# Patient Record
Sex: Female | Born: 1950 | Race: White | Hispanic: No | Marital: Married | Smoking: Never smoker
Health system: Southern US, Community
[De-identification: ages and names within clinical notes are randomized; demographics above are authoritative.]

## PROBLEM LIST (undated history)

## (undated) DIAGNOSIS — D2262 Melanocytic nevi of left upper limb, including shoulder: Secondary | ICD-10-CM

## (undated) DIAGNOSIS — E669 Obesity, unspecified: Secondary | ICD-10-CM

## (undated) DIAGNOSIS — M722 Plantar fascial fibromatosis: Secondary | ICD-10-CM

## (undated) DIAGNOSIS — D2261 Melanocytic nevi of right upper limb, including shoulder: Secondary | ICD-10-CM

## (undated) DIAGNOSIS — C801 Malignant (primary) neoplasm, unspecified: Secondary | ICD-10-CM

## (undated) DIAGNOSIS — G4730 Sleep apnea, unspecified: Secondary | ICD-10-CM

## (undated) DIAGNOSIS — D125 Benign neoplasm of sigmoid colon: Secondary | ICD-10-CM

## (undated) DIAGNOSIS — F1020 Alcohol dependence, uncomplicated: Secondary | ICD-10-CM

## (undated) DIAGNOSIS — M1712 Unilateral primary osteoarthritis, left knee: Secondary | ICD-10-CM

## (undated) DIAGNOSIS — R569 Unspecified convulsions: Secondary | ICD-10-CM

## (undated) DIAGNOSIS — D225 Melanocytic nevi of trunk: Secondary | ICD-10-CM

## (undated) DIAGNOSIS — M19011 Primary osteoarthritis, right shoulder: Secondary | ICD-10-CM

## (undated) DIAGNOSIS — D509 Iron deficiency anemia, unspecified: Secondary | ICD-10-CM

## (undated) DIAGNOSIS — L814 Other melanin hyperpigmentation: Secondary | ICD-10-CM

## (undated) DIAGNOSIS — M810 Age-related osteoporosis without current pathological fracture: Secondary | ICD-10-CM

## (undated) DIAGNOSIS — I8390 Asymptomatic varicose veins of unspecified lower extremity: Secondary | ICD-10-CM

## (undated) DIAGNOSIS — D2271 Melanocytic nevi of right lower limb, including hip: Secondary | ICD-10-CM

## (undated) DIAGNOSIS — D2272 Melanocytic nevi of left lower limb, including hip: Secondary | ICD-10-CM

## (undated) HISTORY — DX: Melanocytic nevi of right upper limb, including shoulder: D22.61

## (undated) HISTORY — DX: Age-related osteoporosis without current pathological fracture: M81.0

## (undated) HISTORY — DX: Melanocytic nevi of left lower limb, including hip: D22.72

## (undated) HISTORY — DX: Melanocytic nevi of trunk: D22.5

## (undated) HISTORY — DX: Melanocytic nevi of left upper limb, including shoulder: D22.62

## (undated) HISTORY — DX: Asymptomatic varicose veins of unspecified lower extremity: I83.90

## (undated) HISTORY — DX: Obesity, unspecified: E66.9

## (undated) HISTORY — DX: Plantar fascial fibromatosis: M72.2

## (undated) HISTORY — PX: ECTOPIC PREGNANCY SURGERY: SHX613

## (undated) HISTORY — DX: Benign neoplasm of sigmoid colon: D12.5

## (undated) HISTORY — DX: Unilateral primary osteoarthritis, left knee: M17.12

## (undated) HISTORY — DX: Other melanin hyperpigmentation: L81.4

## (undated) HISTORY — PX: POLYPECTOMY: SHX149

## (undated) HISTORY — DX: Melanocytic nevi of right lower limb, including hip: D22.71

## (undated) HISTORY — PX: BARIATRIC SURGERY: SHX1103

## (undated) HISTORY — DX: Primary osteoarthritis, right shoulder: M19.011

## (undated) HISTORY — DX: Malignant (primary) neoplasm, unspecified: C80.1

## (undated) HISTORY — DX: Iron deficiency anemia, unspecified: D50.9

## (undated) HISTORY — PX: SIGMOIDOSCOPY: SUR1295

## (undated) HISTORY — DX: Alcohol dependence, uncomplicated: F10.20

## (undated) HISTORY — DX: Unspecified convulsions: R56.9

---

## 2014-07-05 DIAGNOSIS — R569 Unspecified convulsions: Secondary | ICD-10-CM | POA: Insufficient documentation

## 2016-12-11 DIAGNOSIS — M19011 Primary osteoarthritis, right shoulder: Secondary | ICD-10-CM | POA: Insufficient documentation

## 2016-12-11 DIAGNOSIS — M1712 Unilateral primary osteoarthritis, left knee: Secondary | ICD-10-CM

## 2016-12-11 HISTORY — DX: Primary osteoarthritis, right shoulder: M19.011

## 2016-12-11 HISTORY — DX: Unilateral primary osteoarthritis, left knee: M17.12

## 2017-01-11 DIAGNOSIS — I8390 Asymptomatic varicose veins of unspecified lower extremity: Secondary | ICD-10-CM

## 2017-01-11 DIAGNOSIS — D2271 Melanocytic nevi of right lower limb, including hip: Secondary | ICD-10-CM

## 2017-01-11 DIAGNOSIS — D2272 Melanocytic nevi of left lower limb, including hip: Secondary | ICD-10-CM

## 2017-01-11 DIAGNOSIS — L821 Other seborrheic keratosis: Secondary | ICD-10-CM | POA: Insufficient documentation

## 2017-01-11 DIAGNOSIS — M810 Age-related osteoporosis without current pathological fracture: Secondary | ICD-10-CM | POA: Insufficient documentation

## 2017-01-11 DIAGNOSIS — L814 Other melanin hyperpigmentation: Secondary | ICD-10-CM

## 2017-01-11 DIAGNOSIS — D2262 Melanocytic nevi of left upper limb, including shoulder: Secondary | ICD-10-CM

## 2017-01-11 DIAGNOSIS — M542 Cervicalgia: Secondary | ICD-10-CM | POA: Insufficient documentation

## 2017-01-11 DIAGNOSIS — Z1283 Encounter for screening for malignant neoplasm of skin: Secondary | ICD-10-CM | POA: Insufficient documentation

## 2017-01-11 DIAGNOSIS — D2261 Melanocytic nevi of right upper limb, including shoulder: Secondary | ICD-10-CM

## 2017-01-11 DIAGNOSIS — D225 Melanocytic nevi of trunk: Secondary | ICD-10-CM

## 2017-01-11 HISTORY — DX: Melanocytic nevi of left upper limb, including shoulder: D22.62

## 2017-01-11 HISTORY — DX: Melanocytic nevi of right upper limb, including shoulder: D22.61

## 2017-01-11 HISTORY — DX: Other melanin hyperpigmentation: L81.4

## 2017-01-11 HISTORY — DX: Melanocytic nevi of right lower limb, including hip: D22.71

## 2017-01-11 HISTORY — DX: Melanocytic nevi of left lower limb, including hip: D22.72

## 2017-01-11 HISTORY — DX: Melanocytic nevi of trunk: D22.5

## 2017-01-11 HISTORY — DX: Asymptomatic varicose veins of unspecified lower extremity: I83.90

## 2017-01-12 DIAGNOSIS — D509 Iron deficiency anemia, unspecified: Secondary | ICD-10-CM

## 2017-01-12 HISTORY — DX: Iron deficiency anemia, unspecified: D50.9

## 2017-01-25 DIAGNOSIS — R569 Unspecified convulsions: Secondary | ICD-10-CM

## 2017-01-25 HISTORY — DX: Unspecified convulsions: R56.9

## 2017-02-23 DIAGNOSIS — Z800 Family history of malignant neoplasm of digestive organs: Secondary | ICD-10-CM | POA: Insufficient documentation

## 2017-03-18 DIAGNOSIS — D126 Benign neoplasm of colon, unspecified: Secondary | ICD-10-CM | POA: Insufficient documentation

## 2017-03-18 DIAGNOSIS — D125 Benign neoplasm of sigmoid colon: Secondary | ICD-10-CM

## 2017-03-18 HISTORY — PX: COLONOSCOPY: SHX174

## 2017-03-18 HISTORY — DX: Benign neoplasm of sigmoid colon: D12.5

## 2017-04-01 ENCOUNTER — Ambulatory Visit (HOSPITAL_COMMUNITY)
Admission: EM | Admit: 2017-04-01 | Discharge: 2017-04-01 | Disposition: A | Discharge status: Home / Self Care | Payer: Medicare Other | Attending: Internal Medicine | Admitting: Internal Medicine

## 2017-04-01 ENCOUNTER — Encounter (HOSPITAL_COMMUNITY): Payer: Self-pay | Admitting: Emergency Medicine

## 2017-04-01 DIAGNOSIS — M7052 Other bursitis of knee, left knee: Secondary | ICD-10-CM

## 2017-04-01 NOTE — Discharge Instructions (Addendum)
Suspect blood vessels under left knee are prominent due to mild swelling/inflammation from some knee bursitis, stirred up by knee motion during moving a couple weeks ago.  Ice for 5-10 minutes several times daily may help reduce discomfort, and a knee sleeve may provide comfort also.  Ibuprofen 200 mg 1-2 tablets up to 3 times daily should also provide some relief.  No danger signs on exam.  Recheck for marked increase in leg swelling.  Followup with orthopedist next week as planned.

## 2017-04-01 NOTE — ED Triage Notes (Signed)
Pt reports she noticed some skin discoloration this afternoon on her left knee  She is currently being treated for arthritis w/injections   Sx also include pain  Denies inj/trauma  A&O x4... NAD... Ambulatory

## 2017-04-01 NOTE — ED Provider Notes (Signed)
[place]    CSN: [id number] Arrival date & time: 04/01/17  1913     History   Chief Complaint Chief Complaint  Patient presents with  . Skin Discoloration    HPI Summer [name] is a 66 y.o. female. She has been getting some knee injections for arthritis, is visiting out of state. She is moving to the area, and has been in the last couple of weeks carrying boxes and doing a lot of getting up and down. Pain in the left knee has been increasing over that time period. Today, she noticed what seemed to be a large bruise or some prominent blood vessels just below and medial to the left knee. A little worried about a blood clot. No change in leg swelling. Still able to fully flex and extend at the left knee.    HPI  History reviewed. No pertinent past medical history.  History reviewed. No pertinent surgical history.   Home Medications    Prior to Admission medications   Medication Sig Start Date End Date Taking? Authorizing Provider  ferrous sulfate 325 (65 FE) MG EC tablet Take 325 mg by mouth 3 (three) times daily with meals.   Yes [provider]  LAMOTRIGINE PO Take by mouth.   Yes [provider]  LevETIRAcetam (KEPPRA PO) Take by mouth.   Yes [provider]    Family History History reviewed. No pertinent family history.  Social History Social History  Substance Use Topics  . Smoking status: Never Smoker  . Smokeless tobacco: Never Used  . Alcohol use No     Allergies   Patient has no known allergies.   Review of Systems Review of Systems  All other systems reviewed and are negative.    Physical Exam Triage Vital Signs ED Triage Vitals  Enc Vitals Group     BP 04/01/17 2008 138/70     Pulse Rate 04/01/17 2008 72     Resp 04/01/17 2008 20     Temp 04/01/17 2008 98.5 F (36.9 C)     Temp Source 04/01/17 2008 Oral     SpO2 04/01/17 2008 99 %     Weight --      Height --      Pain Score 04/01/17 2010 9   Pain Loc --    Updated Vital Signs BP 138/70 (BP Location: Right Arm)   Pulse 72   Temp 98.5 F (36.9 C) (Oral)   Resp 20   SpO2 99%   Physical Exam  Constitutional: She is oriented to person, place, and time. No distress.  HENT:  Head: Atraumatic.  Eyes:  Conjugate gaze observed, no eye redness/discharge  Neck: Neck supple.  Cardiovascular: Normal rate.   Pulmonary/Chest: No respiratory distress.  Abdominal: She exhibits no distension.  Musculoskeletal: Normal range of motion.  Subtle puffiness and tenderness below and medial to the left knee, with a prominent web of blood vessels. Equivocal slight warmth. No erythema, no bruising, no break in the skin. No focal tenderness. Good range of motion of both knees, able to flex more than 90 and fully extend bilaterally. Walked into the urgent care independently, and able to climb on/off the exam table. Measurements include left calf 19 inches, right calf 17-3/4 inches, left ankle 11-3/4 inches, right ankle 11-1/4 inch. Patient does not believe that her leg contours are significantly larger or different than usual.  Neurological: She is alert and oriented to person, place, and time.  Skin: Skin is warm  and dry.  Nursing note and vitals reviewed.    UC Treatments / Results   Procedures Procedures (including critical care time) None today  Final Clinical Impressions(s) / UC Diagnoses   Final diagnoses:  Pes anserinus bursitis of left knee   Suspect blood vessels under left knee are prominent due to mild swelling/inflammation from some knee bursitis, stirred up by knee motion during moving a couple weeks ago.  Ice for 5-10 minutes several times daily may help reduce discomfort, and a knee sleeve may provide comfort also.  Ibuprofen 200 mg 1-2 tablets up to 3 times daily should also provide some relief.  No danger signs on exam.  Recheck for marked increase in leg swelling.  Followup with orthopedist next week as planned.         Sherlene Shams, MD 04/03/17 1051

## 2017-05-20 ENCOUNTER — Ambulatory Visit (INDEPENDENT_AMBULATORY_CARE_PROVIDER_SITE_OTHER): Payer: Medicare Other | Admitting: Family Medicine

## 2017-05-20 ENCOUNTER — Encounter: Payer: Self-pay | Admitting: Family Medicine

## 2017-05-20 VITALS — BP 122/84 | HR 71 | Temp 97.7°F | Ht 66.0 in | Wt 226.2 lb

## 2017-05-20 DIAGNOSIS — D509 Iron deficiency anemia, unspecified: Secondary | ICD-10-CM | POA: Diagnosis not present

## 2017-05-20 DIAGNOSIS — G8929 Other chronic pain: Secondary | ICD-10-CM

## 2017-05-20 DIAGNOSIS — R238 Other skin changes: Secondary | ICD-10-CM | POA: Diagnosis not present

## 2017-05-20 DIAGNOSIS — M25562 Pain in left knee: Secondary | ICD-10-CM | POA: Diagnosis not present

## 2017-05-20 DIAGNOSIS — E669 Obesity, unspecified: Secondary | ICD-10-CM | POA: Diagnosis not present

## 2017-05-20 NOTE — Progress Notes (Signed)
Summer [name] is a 66 y.o. female is here to Summer [name].   Patient Care Team: Briscoe Deutscher, DO as PCP - General (Family Medicine)   History of Present Illness:   Summer [name] CMA acting as scribe for Dr. [name].  HPI: Patient comes in today to establish care. She just moved to town from [place] in the last week.   Knee pain: Patient is wanting a referral for orthopedic for knee surgery. She has left knee pain.  She does not have pain every day. She has been told by previous provider that she has no cartilage in the knee. Yesterday she had to ice it three times. She takes ibuprofen for the pain. She has had injections in the past with no relief.   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 She declines taking any narcotics due to possible addictive nature.   Anemia: Patient is anemic. She takes a iron supplement for this. Will check labs to determine if she needs iron infusion.   Bariatric surgery: Patient had gastric bypass surgery in 2007 or 2008. She has kept off 100 pounds since surgery. She kept this off by going to the [facility].   Benign papules: Patient is concerned about white papules that are on her face around mouth area. She is more concerned about this because of vanity. We will refer to dermatology to see if they need to be removed.   Health Maintenance Due  Topic Date Due  . Hepatitis C Screening  03-26-1951  . HIV Screening  09/21/1966  . TETANUS/TDAP  09/21/1970  . PAP SMEAR  09/21/1972  . MAMMOGRAM  09/21/2001  . COLONOSCOPY  09/21/2001  . DEXA SCAN  09/21/2016  . PNA vac Low Risk Adult (1 of 2 - PCV13) 09/21/2016   PMHx, SurgHx, SocialHx, Medications, and Allergies were reviewed in the Visit Navigator and updated as appropriate.   Past Medical History:  Diagnosis Date  . Adenomatous polyp of sigmoid colon 03/18/2017   Overview:  Added automatically from request for surgery [phone]  . Asymptomatic varicose veins 01/11/2017  . Iron deficiency  anemia 01/12/2017   Last Assessment & Plan:  Given script for ferrous sulfate and order for repeat cbc with diff and iron studies to be done in 1 month Will also refer pt back to [facility] for repeat colonoscopy and to dietician for nutritional counseling, per pt request  . Lentigines 01/11/2017  . Melanocytic nevi of left lower limb, including hip 01/11/2017  . Melanocytic nevi of left upper limb, including shoulder 01/11/2017  . Melanocytic nevi of right lower limb, including hip 01/11/2017  . Melanocytic nevi of right upper limb, including shoulder 01/11/2017  . Melanocytic nevi of trunk 01/11/2017  . Obesity (BMI 30-39.9) 05/21/2017  . Osteopenia 01/11/2017  . 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 Seizure ([place]) 01/25/2017  . Unilateral primary osteoarthritis, left knee 12/11/2016   No past surgical history on file. No family history on file.   Social History  Substance Use Topics  . Smoking status: Never Smoker  . Smokeless tobacco: Never Used  . Alcohol use No   Current Medications and Allergies:   .  denosumab (PROLIA) 60 MG/ML SOLN injection, Inject 60 mg into the skin every 6 (six) months. Administer in upper arm, thigh, or abdomen, Disp: , Rfl:  .  ferrous sulfate 325 (65 FE) MG EC tablet, Take 325 mg by mouth 3 (three) times daily with meals., Disp: , Rfl:  .  LAMOTRIGINE PO, Take by mouth., Disp: , Rfl:  .  LevETIRAcetam (KEPPRA PO), Take by mouth., Disp: , Rfl:    Allergies  Allergen Reactions  . Bee Venom Swelling  . Latex Rash    Contact dermatitis     Review of Systems:   Pertinent items are noted in the HPI. Otherwise, ROS is negative.  Vitals:   Vitals:   05/20/17 1141  BP: 122/84  Pulse: 71  Temp: 97.7 F (36.5 C)  TempSrc: Oral  SpO2: 97%  Weight: 226 lb 3.2 oz (102.6 kg)  Height: 5\' 6"  (1.676 m)     Body mass index is 36.51 kg/m.  Physical Exam:   Physical Exam  Constitutional: She appears well-developed and well-nourished. No distress.    HENT:  Head: Normocephalic and atraumatic.  Eyes: Pupils are equal, round, and reactive to light. EOM are normal.  Neck: Normal range of motion. Neck supple.  Cardiovascular: Normal rate, regular rhythm, normal heart sounds and intact distal pulses.   Pulmonary/Chest: Effort normal.  Abdominal: Soft.  Skin: Skin is warm.  Psychiatric: She has a normal mood and affect. Her behavior is normal.  Nursing note and vitals reviewed.  Assessment and Plan:   [name] was seen today for establish care.  Diagnoses and all orders for this visit:  Chronic pain of left knee -     Ambulatory referral to Orthopedics  Benign papule -     Ambulatory referral to Dermatology  Obesity (BMI 30-39.9) Comments: The patient is asked to make an attempt to improve diet and exercise patterns to aid in medical management of this problem.    . Reviewed expectations re: course of current medical issues. . Discussed self-management of symptoms. . Outlined signs and symptoms indicating need for more acute intervention. . Patient verbalized understanding and all questions were answered. Marland Kitchen Health Maintenance issues including appropriate healthy diet, exercise, and smoking avoidance were discussed with patient. . See orders for this visit as documented in the electronic medical record. . Patient received an After Visit Summary.  CMA served as Education administrator during this visit. History, Physical, and Plan performed by medical provider. The above documentation has been reviewed and is accurate and complete. Briscoe Deutscher, D.O.  Briscoe Deutscher, DO , [facility] 05/21/2017  Future Appointments Date Time Provider [place]  11/19/2017 1:30 PM Briscoe Deutscher, DO [facility] None

## 2017-05-21 ENCOUNTER — Encounter: Payer: Self-pay | Admitting: Family Medicine

## 2017-05-21 DIAGNOSIS — E669 Obesity, unspecified: Secondary | ICD-10-CM

## 2017-05-21 DIAGNOSIS — D649 Anemia, unspecified: Secondary | ICD-10-CM | POA: Insufficient documentation

## 2017-05-21 HISTORY — DX: Obesity, unspecified: E66.9

## 2017-05-26 ENCOUNTER — Telehealth: Payer: Self-pay | Admitting: Family Medicine

## 2017-05-26 NOTE — Telephone Encounter (Signed)
ROI faxed to [facility]

## 2017-05-27 ENCOUNTER — Telehealth: Payer: Self-pay | Admitting: Family Medicine

## 2017-05-27 NOTE — Telephone Encounter (Signed)
Patient would like to follow through with getting a physical trainer prior to her surgery. Call patient to advise and gather more information.

## 2017-05-28 NOTE — Telephone Encounter (Signed)
Left message for patient asking if she would prefer [facility] PREP Program referral or referral to physical therapy.

## 2017-05-28 NOTE — Telephone Encounter (Signed)
Please advise 

## 2017-05-28 NOTE — Telephone Encounter (Signed)
After weighing the options, patient chose PREP program.  Referral filled out and faxed.

## 2017-05-28 NOTE — Telephone Encounter (Signed)
Offer [facility] PT versus [facility].

## 2017-05-28 NOTE — Telephone Encounter (Signed)
Did you patient to have physical therapy or personal trainer?

## 2017-05-31 ENCOUNTER — Telehealth: Payer: Self-pay

## 2017-05-31 NOTE — Telephone Encounter (Signed)
Spoke w/"Margie" about the PREP and she will be coming in to register on Friday 06/04/17 at 8:30 am.

## 2017-06-07 NOTE — Progress Notes (Signed)
[facility] Report   Patient Details  Name: Summer [name] MRN: [id number] Date of Birth: 07/16/1951 Age: 66 y.o. PCP: Briscoe Deutscher, DO  Vitals:   06/07/17 1256  BP: 140/80  Pulse: 66  Resp: 18  SpO2: 99%  Weight: 232 lb (105.2 kg)         [facility] Eval - 06/07/17 1200      Referral    Referring Provider dr. [name]   Reason for referral High Cholesterol;Hypertension;Inactivity;Obesitity/Overweight;Orthopedic   Program Start Date 06/07/17     Measurement   Waist Circumference 40.5 inches   Hip Circumference 53 inches   Body fat 46.8 percent     Information for Trainer   Goals "lose weight, strength training, decrease pain to neck, LT knee, RT shoulder"   Current Exercise none   Orthopedic Concerns LT knee, RT shoulder, neck   Pertinent Medical History epilepsy, anemia, htn, arthritis   Current Barriers none     Timed Up and Go (TUGS)   Timed Up and Go Moderate risk 10-12 seconds  11.76 secs     Mobility and Daily Activities   I find it easy to walk up or down two or more flights of stairs. 1   I have no trouble taking out the trash. 4   I do housework such as vacuuming and dusting on my own without difficulty. 4   I can easily lift a gallon of milk (8lbs). 4   I can easily walk a mile. 1   I have no trouble reaching into high cupboards or reaching down to pick up something from the floor. 3   I do not have trouble doing out-door work such as Armed forces logistics/support/administrative officer, raking leaves, or gardening. 3     Mobility and Daily Activities   I feel younger than my age. 4   I feel independent. 4   I feel energetic. 2   I live an active life.  2   I feel strong. 2   I feel healthy. 2   I feel active as other people my age. 3     How fit and strong are you.   Fit and Strong Total Score 39     Past Medical History:  Diagnosis Date  . Adenomatous polyp of sigmoid colon 03/18/2017   Overview:  Added automatically from request for surgery [phone]  .  Asymptomatic varicose veins 01/11/2017  . Iron deficiency anemia 01/12/2017   Last Assessment & Plan:  Given script for ferrous sulfate and order for repeat cbc with diff and iron studies to be done in 1 month Will also refer pt back to [facility] for repeat colonoscopy and to dietician for nutritional counseling, per pt request  . Lentigines 01/11/2017  . Melanocytic nevi of left lower limb, including hip 01/11/2017  . Melanocytic nevi of left upper limb, including shoulder 01/11/2017  . Melanocytic nevi of right lower limb, including hip 01/11/2017  . Melanocytic nevi of right upper limb, including shoulder 01/11/2017  . Melanocytic nevi of trunk 01/11/2017  . Obesity (BMI 30-39.9) 05/21/2017  . Osteopenia 01/11/2017  . 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 Seizure ([place]) 01/25/2017  . Unilateral primary osteoarthritis, left knee 12/11/2016   No past surgical history on file. History  Smoking Status  . Never Smoker  Smokeless Tobacco  . Never Used     Summer [name] has joined the [employer] today and seems very motivated to get started with her exercise and lifestyle  changes.  She plans to come to the Wed 11-12 classes.    Summer [name] 06/07/2017, 1:02 PM

## 2017-06-11 NOTE — Progress Notes (Signed)
[facility] PREP Weekly Session   Patient Details  Name: Summer [name] MRN: [id number] Date of Birth: Nov 07, 1950 Age: 66 y.o. PCP: Briscoe Deutscher, DO  There were no vitals filed for this visit.      [facility] Weekly seesion - 06/11/17 1200      Weekly Session   Topic Discussed Health habits   Minutes exercised this week 10 minutes  flexibility   Classes attended to date 1      Fun things you've done since last meeting:"emptied and organized one box" Things you are grateful for:"grandson, travel to see great grandmothers" Nutrition celebrations:"ate salad and apples, fish filets" Barriers:"nights/evenings, insight:cottage cheese 10grams of fat!"  [name] 06/11/2017, 12:57 PM

## 2017-06-17 ENCOUNTER — Telehealth: Payer: Self-pay | Admitting: Family Medicine

## 2017-06-17 NOTE — Telephone Encounter (Signed)
Please advise.  Patient would like a handicapped placard.

## 2017-06-17 NOTE — Telephone Encounter (Signed)
Patient wants to know if Dr. [name] can fill out hadicapped parking forms?  Please advise,   Ty,  -LL

## 2017-06-17 NOTE — Telephone Encounter (Signed)
Okay to do?

## 2017-06-18 NOTE — Telephone Encounter (Signed)
Application filled out, signed, placed at front desk for pick up.  Patient notified.

## 2017-06-25 NOTE — Progress Notes (Signed)
[facility] PREP Weekly Session   Patient Details  Name: Summer [name] MRN: [id number] Date of Birth: Mar 19, 1951 Age: 66 y.o. PCP: Briscoe Deutscher, DO  Vitals:   06/21/17 1001  Weight: 231 lb (104.8 kg)        [facility] Weekly seesion - 06/25/17 1000      Weekly Session   Topic Discussed Health habits   Minutes exercised this week 50 minutes  flexibility   Classes attended to date 2     Fun things you did since last meeting:"babysit our grandson" Things you are grateful for:"family" Nutrition celebrations:"Gazpacho soup, lowfat, healthy, tastes good" Barriers:"nighttime eating"   [name] 06/25/2017, 10:02 AM

## 2017-06-28 ENCOUNTER — Ambulatory Visit (HOSPITAL_COMMUNITY)
Admission: EM | Admit: 2017-06-28 | Discharge: 2017-06-28 | Disposition: A | Discharge status: Home / Self Care | Payer: Medicare Other | Attending: Physician Assistant | Admitting: Physician Assistant

## 2017-06-28 ENCOUNTER — Encounter (HOSPITAL_COMMUNITY): Payer: Self-pay | Admitting: Emergency Medicine

## 2017-06-28 DIAGNOSIS — J00 Acute nasopharyngitis [common cold]: Secondary | ICD-10-CM | POA: Diagnosis not present

## 2017-06-28 DIAGNOSIS — M8580 Other specified disorders of bone density and structure, unspecified site: Secondary | ICD-10-CM | POA: Insufficient documentation

## 2017-06-28 DIAGNOSIS — H9203 Otalgia, bilateral: Secondary | ICD-10-CM | POA: Diagnosis present

## 2017-06-28 DIAGNOSIS — R569 Unspecified convulsions: Secondary | ICD-10-CM | POA: Diagnosis not present

## 2017-06-28 DIAGNOSIS — Z79899 Other long term (current) drug therapy: Secondary | ICD-10-CM | POA: Diagnosis not present

## 2017-06-28 DIAGNOSIS — D509 Iron deficiency anemia, unspecified: Secondary | ICD-10-CM | POA: Diagnosis not present

## 2017-06-28 DIAGNOSIS — Z6830 Body mass index (BMI) 30.0-30.9, adult: Secondary | ICD-10-CM | POA: Diagnosis not present

## 2017-06-28 DIAGNOSIS — J029 Acute pharyngitis, unspecified: Secondary | ICD-10-CM | POA: Diagnosis not present

## 2017-06-28 DIAGNOSIS — E669 Obesity, unspecified: Secondary | ICD-10-CM | POA: Diagnosis not present

## 2017-06-28 LAB — POCT RAPID STREP A: STREPTOCOCCUS, GROUP A SCREEN (DIRECT): NEGATIVE

## 2017-06-28 MED ORDER — FLUTICASONE PROPIONATE 50 MCG/ACT NA SUSP: 2.0000 | Freq: Every day | NASAL | 0 refills | Status: DC

## 2017-06-28 MED ORDER — CETIRIZINE-PSEUDOEPHEDRINE ER 5-120 MG PO TB12
1.0000 | ORAL_TABLET | Freq: Every day | ORAL | 0 refills | Status: DC
Start: 2017-06-28 — End: 2017-07-01

## 2017-06-28 NOTE — ED Provider Notes (Signed)
[place]    CSN: [id number] Arrival date & time: 06/28/17  1031     History   Chief Complaint Chief Complaint  Patient presents with  . Otalgia  . Sore Throat    HPI Summer [name] is a 66 y.o. female.   66 year old female comes in for 5 day history of headache, 2-3 day history of sore throat, bilateral ear pain. Patient has been using over-the-counter ibuprofen with good relief. She has also been using salt water gurgles to help with the sore throat. She has been having trouble swallowing due to pain, but denies swelling of the throat, changes of voice. Has had minimal productive cough. Denies chest pain, shortness of breath, wheezing, trouble breathing. Denies history of seasonal allergies, but recently moved to [place] 2 months ago.      Past Medical History:  Diagnosis Date  . Adenomatous polyp of sigmoid colon 03/18/2017   Overview:  Added automatically from request for surgery [phone]  . Asymptomatic varicose veins 01/11/2017  . Iron deficiency anemia 01/12/2017   Last Assessment & Plan:  Given script for ferrous sulfate and order for repeat cbc with diff and iron studies to be done in 1 month Will also refer pt back to CRS for repeat colonoscopy and to dietician for nutritional counseling, per pt request  . Lentigines 01/11/2017  . Melanocytic nevi of left lower limb, including hip 01/11/2017  . Melanocytic nevi of left upper limb, including shoulder 01/11/2017  . Melanocytic nevi of right lower limb, including hip 01/11/2017  . Melanocytic nevi of right upper limb, including shoulder 01/11/2017  . Melanocytic nevi of trunk 01/11/2017  . Obesity (BMI 30-39.9) 05/21/2017  . Osteopenia 01/11/2017  . 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 Seizure ([place]) 01/25/2017  . Unilateral primary osteoarthritis, left knee 12/11/2016    Patient Active Problem List   Diagnosis Date Noted  . Obesity (BMI 30-39.9) 05/21/2017  . Anemia 05/21/2017  .  Adenomatous polyp of sigmoid colon 03/18/2017  . Seizure ([place]) 01/25/2017  . Iron deficiency anemia 01/12/2017  . Neck pain 01/11/2017  . Osteopenia 01/11/2017  . Lentigines 01/11/2017  . Unilateral primary osteoarthritis, left knee 12/11/2016  . Primary osteoarthritis, right shoulder 12/11/2016    History reviewed. No pertinent surgical history.  OB History    No data available       Home Medications    Prior to Admission medications   Medication Sig Start Date End Date Taking? Authorizing Provider  cetirizine-pseudoephedrine (ZYRTEC-D) 5-120 MG tablet Take 1 tablet by mouth daily. 06/28/17   Tasia Catchings, Ravleen Ries V, PA-C  denosumab (PROLIA) 60 MG/ML SOLN injection Inject 60 mg into the skin every 6 (six) months. Administer in upper arm, thigh, or abdomen    [provider]  ferrous sulfate 325 (65 FE) MG EC tablet Take 325 mg by mouth 3 (three) times daily with meals.    [provider]  fluticasone (FLONASE) 50 MCG/ACT nasal spray Place 2 sprays into both nostrils daily. 06/28/17   Brandolyn Shortridge V, PA-C  LAMOTRIGINE PO Take by mouth.    [provider]  LevETIRAcetam (KEPPRA PO) Take by mouth.    [provider]    Family History No family history on file.  Social History Social History  Substance Use Topics  . Smoking status: Never Smoker  . Smokeless tobacco: Never Used  . Alcohol use No     Allergies   Bee venom and Latex   Review of Systems  Review of Systems  Reason unable to perform ROS: See HPI as above.     Physical Exam Triage Vital Signs ED Triage Vitals  Enc Vitals Group     BP 06/28/17 1142 (!) 109/59     Pulse Rate 06/28/17 1142 70     Resp --      Temp 06/28/17 1142 (!) 97.5 F (36.4 C)     Temp Source 06/28/17 1142 Oral     SpO2 06/28/17 1142 100 %     Weight --      Height --      Head Circumference --      Peak Flow --      Pain Score 06/28/17 1140 2     Pain Loc --      Pain Edu? --      Excl. in [place]? --    No data  found.   Updated Vital Signs BP (!) 109/59 (BP Location: Left Arm) Comment: large cuff  Pulse 70   Temp (!) 97.5 F (36.4 C) (Oral)   SpO2 100%    Physical Exam  Constitutional: She is oriented to person, place, and time. She appears well-developed and well-nourished. No distress.  HENT:  Head: Normocephalic and atraumatic.  Right Ear: Tympanic membrane, external ear and ear canal normal. Tympanic membrane is not erythematous and not bulging.  Left Ear: Tympanic membrane, external ear and ear canal normal. Tympanic membrane is not erythematous and not bulging.  Nose: Nose normal. Right sinus exhibits no maxillary sinus tenderness and no frontal sinus tenderness. Left sinus exhibits no maxillary sinus tenderness and no frontal sinus tenderness.  Mouth/Throat: Uvula is midline and mucous membranes are normal. Oropharyngeal exudate and posterior oropharyngeal erythema present.  Eyes: Pupils are equal, round, and reactive to light. Conjunctivae are normal.  Neck: Normal range of motion. Neck supple.  Cardiovascular: Normal rate, regular rhythm and normal heart sounds.  Exam reveals no gallop and no friction rub.   No murmur heard. Pulmonary/Chest: Effort normal and breath sounds normal. She has no decreased breath sounds. She has no wheezes. She has no rhonchi. She has no rales.  Lymphadenopathy:    She has no cervical adenopathy.  Neurological: She is alert and oriented to person, place, and time.  Skin: Skin is warm and dry.  Psychiatric: She has a normal mood and affect. Her behavior is normal. Judgment normal.     UC Treatments / Results  Labs (all labs ordered are listed, but only abnormal results are displayed) Labs Reviewed  CULTURE, GROUP A STREP [facility])  POCT RAPID STREP A    EKG  EKG Interpretation None       Radiology No results found.  Procedures Procedures (including critical care time)  Medications Ordered in UC Medications - No data to  display   Initial Impression / Assessment and Plan / UC Course  I have reviewed the triage vital signs and the nursing notes.  Pertinent labs & imaging results that were available during my care of the patient were reviewed by me and considered in my medical decision making (see chart for details).    Rapid strep negative. Symptomatic treatment as needed. Return precautions given.    Final Clinical Impressions(s) / UC Diagnoses   Final diagnoses:  Acute nasopharyngitis    New Prescriptions Discharge Medication List as of 06/28/2017 12:53 PM    START taking these medications   Details  cetirizine-pseudoephedrine (ZYRTEC-D) 5-120 MG tablet Take 1 tablet by mouth daily., Starting  Mon 06/28/2017, Normal    fluticasone (FLONASE) 50 MCG/ACT nasal spray Place 2 sprays into both nostrils daily., Starting Mon 06/28/2017, Normal           Cathlean Sauer V, PA-C 06/28/17 2059

## 2017-06-28 NOTE — ED Triage Notes (Signed)
Patient has a sore throat and bilateral ear pain.  Onset of symptoms was Thursday.

## 2017-06-28 NOTE — Discharge Instructions (Signed)
Rapid strep negative. Symptoms are most likely due to viral illness. Start Flonase and/or Zyrtec-D for nasal congestion. You can use over the counter nasal saline rinse such as neti pot for nasal congestion. Monitor for any worsening of symptoms, swelling of the throat, trouble breathing, trouble swallowing, follow up for reevaluation.

## 2017-06-30 LAB — CULTURE, GROUP A STREP (THRC)

## 2017-07-01 ENCOUNTER — Encounter: Payer: Self-pay | Admitting: Family Medicine

## 2017-07-01 ENCOUNTER — Ambulatory Visit (INDEPENDENT_AMBULATORY_CARE_PROVIDER_SITE_OTHER): Payer: Medicare Other | Admitting: Family Medicine

## 2017-07-01 VITALS — BP 110/90 | HR 83 | Temp 97.5°F | Ht 66.0 in | Wt 225.4 lb

## 2017-07-01 DIAGNOSIS — J329 Chronic sinusitis, unspecified: Secondary | ICD-10-CM

## 2017-07-01 DIAGNOSIS — J310 Chronic rhinitis: Secondary | ICD-10-CM

## 2017-07-01 MED ORDER — AZITHROMYCIN 250 MG PO TABS: ORAL_TABLET | ORAL | 0 refills | Status: DC

## 2017-07-01 NOTE — Patient Instructions (Signed)
Start the zpack if symptoms do not improve in 2-3 days.  Take care,  Dr Jerline Pain

## 2017-07-01 NOTE — Progress Notes (Signed)
   Subjective:  Summer [name] is a 66 y.o. female who presents today with a chief complaint of cough.   HPI:  Cough Symptoms started about a 7-8 days ago. Associated symptoms include rhinorrhea and sore throat. Some ear fullness. Not getting better over the past couple of days. Grandson had similar symptoms about a week ago. Claritin-D helped some.   ROS: Per HPI  Objective:  Physical Exam: BP 110/90   Pulse 83   Temp (!) 97.5 F (36.4 C) (Oral)   Ht 5\' 6"  (1.676 m)   Wt 225 lb 6.4 oz (102.2 kg)   SpO2 99%   BMI 36.38 kg/m   Gen: NAD, resting comfortably HEENT: TMs with clear effusions. OP erythematous. No LAD.  CV: RRR with no murmurs appreciated Pulm: NWOB, CTAB with no crackles, wheezes, or rhonchi  Assessment/Plan:  Rhinosinusitis Likely a prolonged viral illness, however given the duration of her symptoms, provided her a "pocket prescription" of azithromycin to start if symptoms not improving in 2-3 days. Otherwise recommended continue supportive care with OTC analgesics as need, and good PO hydration. Return precautions reviewed. Follow up as needed.     Summer [name]. Jerline Pain, MD 07/01/2017 10:54 AM

## 2017-07-07 NOTE — Progress Notes (Signed)
[facility] PREP Weekly Session   Patient Details  Name: Summer [name] MRN: [id number] Date of Birth: August 09, 1951 Age: 66 y.o. PCP: Briscoe Deutscher, DO  Vitals:   07/07/17 1321  Weight: 224 lb (101.6 kg)        [facility] Weekly seesion - 07/07/17 1300      Weekly Session   Topic Discussed Restaurant Eating   Minutes exercised this week 65 minutes  15cardio/56flexibility"   Classes attended to date 3     Fun things you did since last meeting:"watched [employer] win" Things you are grateful for:"being close to family" nutrition celebrations:"learning to not overcook dishes so they don't need sauces, etc"  [name] 07/07/2017, 1:21 PM

## 2017-07-16 ENCOUNTER — Telehealth: Payer: Self-pay | Admitting: Family Medicine

## 2017-07-16 NOTE — Telephone Encounter (Signed)
Called patient to schedule welcome to medicare visit with Dr. [name] and cancel the AWV scheduled with Cassie. Left VM.

## 2017-07-19 ENCOUNTER — Telehealth: Payer: Self-pay | Admitting: Family Medicine

## 2017-07-19 NOTE — Telephone Encounter (Signed)
Attempted to call pt back, left VM requesting call back.

## 2017-07-19 NOTE — Telephone Encounter (Signed)
Patient needs a call to go over in depth the difference between the Medicare Wellness visits and Welcome to [facility] visits. Patient thought she had already had the visit done. Call patient to advise.

## 2017-07-19 NOTE — Progress Notes (Signed)
[facility] PREP Weekly Session   Patient Details  Name: Summer [name] MRN: [id number] Date of Birth: 09-03-1951 Age: 66 y.o. PCP: Briscoe Deutscher, DO  Vitals:   07/14/17 1209  Weight: 227 lb (103 kg)        [facility] [employer] Weekly seesion - 07/19/17 1200      Weekly Session   Topic Discussed Stress management and problem solving   Minutes exercised this week 90 minutes  flexibility   Classes attended to date 4     Fun things you did since last meeting:"Newsies "Community theater play" Things you are grateful for:"dodging the storm" Nutrition celebrations:"adding more fish, pork & beans" Barriers:"nightime eating. Sick:( didn't exercise"  [name] 07/19/2017, 12:10 PM

## 2017-07-22 ENCOUNTER — Ambulatory Visit: Payer: Medicare Other | Admitting: *Deleted

## 2017-07-23 ENCOUNTER — Encounter: Payer: Self-pay | Admitting: Physician Assistant

## 2017-07-23 ENCOUNTER — Ambulatory Visit (INDEPENDENT_AMBULATORY_CARE_PROVIDER_SITE_OTHER): Payer: Medicare Other | Admitting: Physician Assistant

## 2017-07-23 VITALS — BP 124/80 | HR 89 | Temp 98.5°F | Wt 231.6 lb

## 2017-07-23 DIAGNOSIS — L089 Local infection of the skin and subcutaneous tissue, unspecified: Secondary | ICD-10-CM

## 2017-07-23 NOTE — Progress Notes (Signed)
Summer [name] is a 66 y.o. female here for a new problem.  History of Present Illness:   Chief Complaint  Patient presents with  . Insect Bite    burning X1day     HPI   Patient reports that last night she noticed a bug bite on the R side of her scalp. She denies any recent outdoors exposure. She did not see the bug that bit her. She denies HA, unusual fatigue, rashes, neck pain, or other symptoms. She has not tried any treatment for her symptoms.   Past Medical History:  Diagnosis Date  . Adenomatous polyp of sigmoid colon 03/18/2017   Overview:  Added automatically from request for surgery [phone]  . Asymptomatic varicose veins 01/11/2017  . Iron deficiency anemia 01/12/2017   Last Assessment & Plan:  Given script for ferrous sulfate and order for repeat cbc with diff and iron studies to be done in 1 month Will also refer pt back to [facility] for repeat colonoscopy and to dietician for nutritional counseling, per pt request  . Lentigines 01/11/2017  . Melanocytic nevi of left lower limb, including hip 01/11/2017  . Melanocytic nevi of left upper limb, including shoulder 01/11/2017  . Melanocytic nevi of right lower limb, including hip 01/11/2017  . Melanocytic nevi of right upper limb, including shoulder 01/11/2017  . Melanocytic nevi of trunk 01/11/2017  . Obesity (BMI 30-39.9) 05/21/2017  . Osteopenia 01/11/2017  . 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 Seizure ([place]) 01/25/2017  . Unilateral primary osteoarthritis, left knee 12/11/2016     Social History   Social History  . Marital status: Married    Spouse name: N/A  . Number of children: N/A  . Years of education: N/A   Occupational History  . Not on file.   Social History Main Topics  . Smoking status: Never Smoker  . Smokeless tobacco: Never Used  . Alcohol use No  . Drug use: No  . Sexual activity: No   Other Topics Concern  . Not on file   Social History Narrative  . No narrative on file    No past  surgical history on file.  No family history on file.  Allergies  Allergen Reactions  . Bee Venom Swelling  . Latex Rash    Contact dermatitis    Current Medications:   Current Outpatient Prescriptions:  .  denosumab (PROLIA) 60 MG/ML SOLN injection, Inject 60 mg into the skin every 6 (six) months. Administer in upper arm, thigh, or abdomen, Disp: , Rfl:  .  ferrous sulfate 325 (65 FE) MG EC tablet, Take 325 mg by mouth daily with breakfast. , Disp: , Rfl:  .  fluticasone (FLONASE) 50 MCG/ACT nasal spray, Place 2 sprays into both nostrils daily., Disp: 1 g, Rfl: 0 .  LAMOTRIGINE PO, Take by mouth., Disp: , Rfl:  .  LevETIRAcetam (KEPPRA PO), Take by mouth., Disp: , Rfl:  .  loratadine-pseudoephedrine (CLARITIN-D 24-HOUR) 10-240 MG 24 hr tablet, Take 1 tablet by mouth daily., Disp: , Rfl:    Review of Systems:   Review of Systems  Constitutional: Negative for chills, fever, malaise/fatigue and weight loss.  Respiratory: Negative for shortness of breath.   Cardiovascular: Negative for chest pain, orthopnea, claudication and leg swelling.  Gastrointestinal: Negative for heartburn, nausea and vomiting.  Skin:       Bump to R side of scalp   Neurological: Negative for dizziness, tingling and headaches.    Vitals:   Vitals:  07/23/17 1435  BP: 124/80  Pulse: 89  Temp: 98.5 F (36.9 C)  TempSrc: Oral  SpO2: 98%  Weight: 231 lb 9.6 oz (105.1 kg)     Body mass index is 37.38 kg/m.  Physical Exam:   Physical Exam  Constitutional: She appears well-developed. She is cooperative.  Non-toxic appearance. She does not have a sickly appearance. She does not appear ill. No distress.  Cardiovascular: Normal rate, regular rhythm, S1 normal, S2 normal, normal heart sounds and normal pulses.   No LE edema  Pulmonary/Chest: Effort normal and breath sounds normal.  Neurological: She is alert. GCS eye subscore is 4. GCS verbal subscore is 5. GCS motor subscore is 6.  Skin: Skin is  warm, dry and intact.  1-mm pustule without surrounding erythema or swelling located in scalp approximately 2 inches from R ear towards middle of skull; tenderness with palpation  Psychiatric: She has a normal mood and affect. Her speech is normal and behavior is normal.  Nursing note and vitals reviewed.  Consent: Risks and benefits of therapy discussed with patient who voices understanding and agrees with planned care. No barriers to communication or understanding identified. After obtaining informed consent, the patient's identity, procedure, and site were verified during a pause prior to proceeding with the minor surgical procedure as per universal protocol recommendations. After appropriate cleansing, 25-gauge needle was used to open the pustule and express contents.    Assessment and Plan:    Albie was seen today for insect bite.  Diagnoses and all orders for this visit:  Skin pustule   Skin pustule on R scalp, likely folliculitis. Patient was agreeable to opening of area with small needle to express contents. Tolerated procedure well. No indication for antibiotics. Keep area clean with soap and water. Education provided: Aftercare, risks of bleeding, and risks of recurrence were discussed. All questions answered. Follow-up if symptoms worsen or persist.   . Reviewed expectations re: course of current medical issues. . Discussed self-management of symptoms. . Outlined signs and symptoms indicating need for more acute intervention. . Patient verbalized understanding and all questions were answered. . See orders for this visit as documented in the electronic medical record. . Patient received an After-Visit Summary.  CMA or LPN served as scribe during this visit. History, Physical, and Plan performed by medical provider. Documentation and orders reviewed and attested to.  Inda Coke, PA-C

## 2017-07-23 NOTE — Progress Notes (Signed)
[facility] PREP Weekly Session   Patient Details  Name: Summer [name] MRN: [id number] Date of Birth: 12-Mar-1951 Age: 66 y.o. PCP: Briscoe Deutscher, DO  Vitals:   07/21/17 1405  Weight: 227 lb 3.2 oz (103.1 kg)        [facility] [employer] Weekly seesion - 07/23/17 1400      Weekly Session   Topic Discussed Expectations and non-scale victories   Minutes exercised this week 290 minutes  140cardio/60strength/2flexibility   Classes attended to date 5     Fun things you've done:"raked yard in cool weather" Things you are grateful for:"this group and the Y" Nutrition celebrations:"added black beans to baked beans to reduce sugar" Barriers:"night time"  [name] 07/23/2017, 2:06 PM

## 2017-07-30 ENCOUNTER — Encounter: Payer: Self-pay | Admitting: Family Medicine

## 2017-07-30 ENCOUNTER — Ambulatory Visit (INDEPENDENT_AMBULATORY_CARE_PROVIDER_SITE_OTHER): Payer: Medicare Other | Admitting: Family Medicine

## 2017-07-30 VITALS — BP 126/78 | HR 73 | Temp 98.3°F | Ht 66.0 in | Wt 228.2 lb

## 2017-07-30 DIAGNOSIS — D509 Iron deficiency anemia, unspecified: Secondary | ICD-10-CM | POA: Diagnosis not present

## 2017-07-30 DIAGNOSIS — E669 Obesity, unspecified: Secondary | ICD-10-CM

## 2017-07-30 DIAGNOSIS — Z0000 Encounter for general adult medical examination without abnormal findings: Secondary | ICD-10-CM | POA: Diagnosis not present

## 2017-07-30 NOTE — Progress Notes (Signed)
Subjective:    Summer [name] is a 66 y.o. female who presents for Medicare Initial Preventive Examination.  Preventive Screening-Counseling & Management  Tobacco History  Smoking Status  . Never Smoker  Smokeless Tobacco  . Never Used    Opioid Use:  None. She is a recovering acholic for 4 years. She declines taking any narcotics due to possible addictive nature.   Current Problems (verified) Patient Active Problem List   Diagnosis Date Noted  . Obesity (BMI 30-39.9) 05/21/2017  . Anemia 05/21/2017  . Adenomatous polyp of sigmoid colon 03/18/2017  . Seizure ([place]) 01/25/2017  . Iron deficiency anemia 01/12/2017  . Neck pain 01/11/2017  . Osteoporosis 01/11/2017  . Lentigines 01/11/2017  . Unilateral primary osteoarthritis, left knee 12/11/2016  . Primary osteoarthritis, right shoulder 12/11/2016   Medications Prior to Visit Current Outpatient Prescriptions on File Prior to Visit  Medication Sig Dispense Refill  . denosumab (PROLIA) 60 MG/ML SOLN injection Inject 60 mg into the skin every 6 (six) months. Administer in upper arm, thigh, or abdomen    . ferrous sulfate 325 (65 FE) MG EC tablet Take 325 mg by mouth daily with breakfast.     . fluticasone (FLONASE) 50 MCG/ACT nasal spray Place 2 sprays into both nostrils daily. 1 g 0  . LAMOTRIGINE PO Take by mouth.    . LevETIRAcetam (KEPPRA PO) Take by mouth.    . loratadine-pseudoephedrine (CLARITIN-D 24-HOUR) 10-240 MG 24 hr tablet Take 1 tablet by mouth daily.     No current facility-administered medications on file prior to visit.    Current Medications (verified) Current Outpatient Prescriptions  Medication Sig Dispense Refill  . denosumab (PROLIA) 60 MG/ML SOLN injection Inject 60 mg into the skin every 6 (six) months. Administer in upper arm, thigh, or abdomen    . ferrous sulfate 325 (65 FE) MG EC tablet Take 325 mg by mouth daily with breakfast.     . fluticasone (FLONASE) 50 MCG/ACT nasal spray Place 2 sprays  into both nostrils daily. 1 g 0  . LAMOTRIGINE PO Take by mouth.    . LevETIRAcetam (KEPPRA PO) Take by mouth.    . loratadine-pseudoephedrine (CLARITIN-D 24-HOUR) 10-240 MG 24 hr tablet Take 1 tablet by mouth daily.     No current facility-administered medications for this visit.     Allergies (verified) Bee venom and Latex   PAST HISTORY Past Medical History:  Diagnosis Date  . Adenomatous polyp of sigmoid colon 03/18/2017   Overview:  Added automatically from request for surgery [phone]  . Asymptomatic varicose veins 01/11/2017  . Iron deficiency anemia 01/12/2017   Last Assessment & Plan:  Given script for ferrous sulfate and order for repeat cbc with diff and iron studies to be done in 1 month Will also refer pt back to [facility] for repeat colonoscopy and to dietician for nutritional counseling, per pt request  . Lentigines 01/11/2017  . Melanocytic nevi of left lower limb, including hip 01/11/2017  . Melanocytic nevi of left upper limb, including shoulder 01/11/2017  . Melanocytic nevi of right lower limb, including hip 01/11/2017  . Melanocytic nevi of right upper limb, including shoulder 01/11/2017  . Melanocytic nevi of trunk 01/11/2017  . Obesity (BMI 30-39.9) 05/21/2017  . Osteopenia 01/11/2017  . 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 Seizure ([place]) 01/25/2017  . Unilateral primary osteoarthritis, left knee 12/11/2016   Past Surgical History:  Procedure Laterality Date  . BARIATRIC SURGERY     History reviewed. No  pertinent family history.   Social History  Substance Use Topics  . Smoking status: Never Smoker  . Smokeless tobacco: Never Used  . Alcohol use No    Are there smokers in your home (other than you)? No  Risk Factors Current exercise habits: PREP at [facility]  Dietary issues discussed: Nutrition classes at [facility]. Patient had gastric bypass surgery in 2007. She has kept off 100 pounds since surgery.   Cardiac risk factors: advanced age (older than 55 for men, 88  for women), obesity (BMI >= 30 kg/m2) and sedentary lifestyle.  Depression Screen (Note: if answer to either of the following is "Yes", a more complete depression screening is indicated)   Over the past 2 weeks, have you felt down, depressed or hopeless? No  Over the past 2 weeks, have you felt little interest or pleasure in doing things? No  Have you lost interest or pleasure in daily life? No  Do you often feel hopeless? No  Do you cry easily over simple problems? No  Activities of Daily Living In your present state of health, do you have any difficulty performing the following activities?:  Driving? No Managing money?  No Feeding yourself? No Getting from bed to chair? No Climbing a flight of stairs? Yes Preparing food and eating?: No Bathing or showering? No Getting dressed: No Getting to the toilet? No Using the toilet: No Moving around from place to place: No In the past year have you fallen or had a near fall?: No   Are you sexually active?  No  Do you have more than one partner?  No  Hearing Difficulties: No Do you often ask people to speak up or repeat themselves? No Do you experience ringing or noises in your ears? No Do you have difficulty understanding soft or whispered voices? No   Do you feel that you have a problem with memory? No  Do you often misplace items? No  Do you feel safe at home?  Yes  Cognitive Testing  Alert? Yes  Normal Appearance?Yes  Oriented to person? Yes  Place? Yes   Time? Yes  Recall of three objects?  Yes  Can perform simple calculations? Yes  Displays appropriate judgment?Yes  Can read the correct time from a watch face?Yes   Advanced Directives have been discussed with the patient? Yes  List the Names of Other Physician/Practitioners you currently use: 1.  Paralee Cancel, Orthopedics  Indicate any recent Medical Services you may have received from other than [facility] providers in the past year (date may be approximate). 1. PREP Class  at [place] History  Administered Date(s) Administered  . H1N1 09/12/2008  . Influenza, High Dose Seasonal PF 01/01/2015, 09/07/2016  . Influenza, Quadrivalent, Recombinant, Inj, Pf 01/01/2015, 09/07/2016  . Influenza,trivalent, recombinat, inj, PF 06/28/2011, 10/29/2012  . Pneumococcal Conjugate-13 10/01/2016  . Pneumococcal Polysaccharide-23 12/20/2013  . Tdap 01/27/2012, 09/08/2016   Screening Tests Health Maintenance  Topic Date Due  . Hepatitis C Screening  11-Jul-1951  . HIV Screening  09/21/1966  . PAP SMEAR  09/21/1972  . MAMMOGRAM  09/21/2001  . COLONOSCOPY  09/21/2001  . INFLUENZA VACCINE  05/26/2017  . PNA vac Low Risk Adult (2 of 2 - PPSV23) 12/20/2018  . TETANUS/TDAP  09/08/2026  . DEXA SCAN  Completed   All answers were reviewed with the patient and necessary referrals were made:  Briscoe Deutscher, DO   08/02/2017   History reviewed: allergies, current medications, past family history, past medical history,  past social history, past surgical history and problem list  Review of Systems Pertinent items noted in HPI and remainder of comprehensive ROS otherwise negative.    Objective:   Vision by Snellen chart: right eye:20/50, left eye:20/30  Body mass index is 36.83 kg/m. BP 126/78   Pulse 73   Temp 98.3 F (36.8 C) (Oral)   Ht 5\' 6"  (1.676 m)   Wt 228 lb 3.2 oz (103.5 kg)   SpO2 97%   BMI 36.83 kg/m    General Appearance:    Alert, cooperative, no distress, appears stated age  Head:    Normocephalic, without obvious abnormality, atraumatic  Eyes:    PERRL, conjunctiva/corneas clear, EOM's intact, fundi    benign, both eyes  Ears:    Normal TM's and external ear canals, both ears  Nose:   Nares normal, septum midline, mucosa normal, no drainage    or sinus tenderness  Throat:   Lips, mucosa, and tongue normal; teeth and gums normal  Neck:   Supple, symmetrical, trachea midline, no adenopathy;    thyroid:  no enlargement/tenderness/nodules;  no carotid   bruit or JVD  Back:     Symmetric, no curvature, ROM normal, no CVA tenderness  Lungs:     Clear to auscultation bilaterally, respirations unlabored  Chest Wall:    No tenderness or deformity   Heart:    Regular rate and rhythm, S1 and S2 normal, no murmur, rub   or gallop  Abdomen:     Soft, non-tender, bowel sounds active all four quadrants,    no masses, no organomegaly  Extremities:   Extremities normal, atraumatic, no cyanosis or edema  Pulses:   2+ and symmetric all extremities  Skin:   Skin color, texture, turgor normal, no rashes or lesions  Lymph nodes:   Cervical, supraclavicular, and axillary nodes normal  Neurologic:   CNII-XII intact, normal strength, sensation and reflexes    throughout   EKG: normal EKG, normal sinus rhythm, unchanged from previous tracings.   Assessment:   Diagnoses and all orders for this visit:  Encounter for initial preventive physical examination covered by Medicare -     EKG 12-Lead -     CBC with Differential/Platelet -     Comprehensive metabolic panel  Obesity (BMI 30-39.9) Comments: Continue PREP at [employer] Orders: -     EKG 12-Lead -     CBC with Differential/Platelet -     Comprehensive metabolic panel  Iron deficiency anemia, unspecified iron deficiency anemia type -     EKG 12-Lead -     Ferritin -     Iron, TIBC and Ferritin Panel  Plan:   During the course of the visit the patient was educated and counseled about appropriate screening and preventive services including:    Screening electrocardiogram  Nutrition counseling   Advanced directives: power of attorney for healthcare on file  Diet review for nutrition referral? PREP at [facility]  Patient Instructions (the written plan) was given to the patient.  Medicare Attestation I have personally reviewed: The patient's medical and social history Their use of alcohol, tobacco or illicit drugs Their current medications and supplements The patient's functional  ability including ADLs,fall risks, home safety risks, cognitive, and hearing and visual impairment Diet and physical activities Evidence for depression or mood disorders  The patient's weight, height, BMI, and visual acuity have been recorded in the chart.  I have made referrals, counseling, and provided education to the patient based on review  of the above and I have provided the patient with a written personalized care plan for preventive services.    Briscoe Deutscher, DO   08/02/2017

## 2017-07-31 LAB — IRON,TIBC AND FERRITIN PANEL
%SAT: 19 % (calc) (ref 11–50)
Ferritin: 9 ng/mL — ABNORMAL LOW (ref 20–288)
Iron: 74 ug/dL (ref 45–160)
TIBC: 400 mcg/dL (calc) (ref 250–450)

## 2017-07-31 LAB — CBC WITH DIFFERENTIAL/PLATELET
Basophils Absolute: 23 cells/uL (ref 0–200)
Basophils Relative: 0.4 %
Eosinophils Absolute: 103 cells/uL (ref 15–500)
Eosinophils Relative: 1.8 %
HCT: 34.6 % — ABNORMAL LOW (ref 35.0–45.0)
Hemoglobin: 11.3 g/dL — ABNORMAL LOW (ref 11.7–15.5)
Lymphs Abs: 2633 cells/uL (ref 850–3900)
MCH: 25.9 pg — ABNORMAL LOW (ref 27.0–33.0)
MCHC: 32.7 g/dL (ref 32.0–36.0)
MCV: 79.4 fL — ABNORMAL LOW (ref 80.0–100.0)
MPV: 9.9 fL (ref 7.5–12.5)
Monocytes Relative: 9.5 %
Neutro Abs: 2400 cells/uL (ref 1500–7800)
Neutrophils Relative %: 42.1 %
Platelets: 271 10*3/uL (ref 140–400)
RBC: 4.36 10*6/uL (ref 3.80–5.10)
RDW: 17.6 % — ABNORMAL HIGH (ref 11.0–15.0)
Total Lymphocyte: 46.2 %
WBC mixed population: 542 cells/uL (ref 200–950)
WBC: 5.7 10*3/uL (ref 3.8–10.8)

## 2017-07-31 LAB — COMPREHENSIVE METABOLIC PANEL
AG Ratio: 1.9 (calc) (ref 1.0–2.5)
ALT: 16 U/L (ref 6–29)
AST: 18 U/L (ref 10–35)
Albumin: 4.1 g/dL (ref 3.6–5.1)
Alkaline phosphatase (APISO): 52 U/L (ref 33–130)
BUN: 10 mg/dL (ref 7–25)
CO2: 28 mmol/L (ref 20–32)
Calcium: 8.7 mg/dL (ref 8.6–10.4)
Chloride: 104 mmol/L (ref 98–110)
Creat: 0.53 mg/dL (ref 0.50–0.99)
Globulin: 2.2 g/dL (calc) (ref 1.9–3.7)
Glucose, Bld: 90 mg/dL (ref 65–99)
Potassium: 4.3 mmol/L (ref 3.5–5.3)
Sodium: 139 mmol/L (ref 135–146)
Total Bilirubin: 0.4 mg/dL (ref 0.2–1.2)
Total Protein: 6.3 g/dL (ref 6.1–8.1)

## 2017-08-03 ENCOUNTER — Telehealth: Payer: Self-pay | Admitting: Family Medicine

## 2017-08-03 NOTE — Telephone Encounter (Signed)
Paperwork: Patient walked in to drop off surgery clearance forms   Paperwork received by Trecia Rogers requesting form]: LUMIN    Individual made aware of 3-5 business day turn around (Y/N): YES   Office form(s) completed and placed with paperwork (Y/N): YES    Form location: [employer]

## 2017-08-03 NOTE — Telephone Encounter (Signed)
Patient calling to inquire about receiving a referral or ideas on a family dental practice for her and her family. Patient stated that at her last appointment this was discussed however, was side tracked due to lab work needing to be done. Call patient to advise and give suggestions.

## 2017-08-03 NOTE — Telephone Encounter (Signed)
Spoke with patient and gave three numbers of local dentist for the patient to try.

## 2017-08-04 NOTE — Telephone Encounter (Signed)
On [employer].

## 2017-08-05 NOTE — Telephone Encounter (Signed)
Form signed and faxed

## 2017-08-18 NOTE — Progress Notes (Signed)
[facility] PREP Weekly Session   Patient Details  Name: Summer [name] MRN: [id number] Date of Birth: 09-05-1951 Age: 66 y.o. PCP: Briscoe Deutscher, DO  Vitals:   07/28/17 1359  Weight: 229 lb (103.9 kg)        [facility] Weekly seesion - 08/18/17 1300      Weekly Session   Topic Discussed Other  guest speaker-Al   Minutes exercised this week 140 minutes  20cardio/60strength/59flexibility   Classes attended to date 6     Fun things you did since last meeting:"toddler sitting" Things you are grateful for:"family Nutrition celebrations:"carrots at night" Barriers:"night time eating"  [name] 08/18/2017, 2:00 PM

## 2017-08-27 NOTE — H&P (Signed)
TOTAL KNEE ADMISSION H&P  Patient is being admitted for left total knee arthroplasty.  Subjective:  Chief Complaint:   Left knee primary OA / pain  HPI: HOPE BRANDENBURGER, 66 y.o. female, has a history of pain and functional disability in the left knee due to arthritis and has failed non-surgical conservative treatments for greater than 12 weeks to includeNSAID's and/or analgesics, corticosteriod injections and activity modification.  Onset of symptoms was gradual, starting 2-3 years ago with gradually worsening course since that time. The patient noted no past surgery on the left knee(s).  Patient currently rates pain in the left knee(s) at 10 out of 10 with activity. Patient has worsening of pain with activity and weight bearing, pain that interferes with activities of daily living, pain with passive range of motion, crepitus and joint swelling.  Patient has evidence of periarticular osteophytes and joint space narrowing by imaging studies.  There is no active infection.  Risks, benefits and expectations were discussed with the patient.  Risks including but not limited to the risk of anesthesia, blood clots, nerve damage, blood vessel damage, failure of the prosthesis, infection and up to and including death.  Patient understand the risks, benefits and expectations and wishes to proceed with surgery.   PCP: Briscoe Deutscher, DO  D/C Plans:       Home   Post-op Meds:       No Rx given  Tranexamic Acid:      To be given - IV  Decadron:      Is to be given  FYI:     ASA  Norco  DME:    Rx given for - RW and 3-n-1  PT:    OPPT Rx given    Patient Active Problem List   Diagnosis Date Noted  . Obesity (BMI 30-39.9) 05/21/2017  . Anemia 05/21/2017  . Adenomatous polyp of sigmoid colon 03/18/2017  . Seizure ([place]) 01/25/2017  . Iron deficiency anemia 01/12/2017  . Neck pain 01/11/2017  . Osteoporosis 01/11/2017  . Lentigines 01/11/2017  . Unilateral primary osteoarthritis, left knee  12/11/2016  . Primary osteoarthritis, right shoulder 12/11/2016   Past Medical History:  Diagnosis Date  . Adenomatous polyp of sigmoid colon 03/18/2017   Overview:  Added automatically from request for surgery [id number]  . Asymptomatic varicose veins 01/11/2017  . Iron deficiency anemia 01/12/2017   Last Assessment & Plan:  Given script for ferrous sulfate and order for repeat cbc with diff and iron studies to be done in 1 month Will also refer pt back to [facility] for repeat colonoscopy and to dietician for nutritional counseling, per pt request  . Lentigines 01/11/2017  . Melanocytic nevi of left lower limb, including hip 01/11/2017  . Melanocytic nevi of left upper limb, including shoulder 01/11/2017  . Melanocytic nevi of right lower limb, including hip 01/11/2017  . Melanocytic nevi of right upper limb, including shoulder 01/11/2017  . Melanocytic nevi of trunk 01/11/2017  . Obesity (BMI 30-39.9) 05/21/2017  . Osteoporosis   . 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 Seizure ([place]) 01/25/2017  . Unilateral primary osteoarthritis, left knee 12/11/2016    Past Surgical History:  Procedure Laterality Date  . BARIATRIC SURGERY      No current facility-administered medications for this encounter.    Current Outpatient Prescriptions  Medication Sig Dispense Refill Last Dose  . Calcium-Vitamin D-Vitamin K [id number] MG-UNT-MCG TABS Take 1 tablet by mouth 2 (two) times daily. Also includes Vitamin C     .  Cholecalciferol (SM VITAMIN D3) 4000 units CAPS Take 4,000 Units by mouth daily.     . ferrous sulfate 325 (65 FE) MG EC tablet Take 325 mg by mouth daily with breakfast.    Taking  . fluticasone (FLONASE) 50 MCG/ACT nasal spray Place 2 sprays into both nostrils daily. (Patient taking differently: Place 2 sprays into both nostrils daily as needed for allergies. ) 1 g 0 Taking  . Glucosamine-Chondroit-Vit C-Mn (GLUCOSAMINE CHONDR 1500 COMPLX PO) Take 1 capsule by mouth daily.     Marland Kitchen ibuprofen  (ADVIL,MOTRIN) 200 MG tablet Take 200 mg by mouth every 8 (eight) hours as needed for mild pain.     Marland Kitchen ketotifen (ZADITOR) 0.025 % ophthalmic solution Place 1 drop into both eyes daily.     Marland Kitchen lamoTRIgine (LAMICTAL) 150 MG tablet Take 150 mg by mouth 2 (two) times daily.     Marland Kitchen levETIRAcetam (KEPPRA) 500 MG tablet Take 500 mg by mouth 2 (two) times daily.     . multivitamin-lutein (OCUVITE-LUTEIN) CAPS capsule Take 1 capsule by mouth daily.     . Omega-3 Fatty Acids (OMEGA 3 PO) Take 1 capsule by mouth daily.     . vitamin E 600 UNIT capsule Take 600 Units by mouth daily.     Marland Kitchen denosumab (PROLIA) 60 MG/ML SOLN injection Inject 60 mg into the skin every 6 (six) months. Administer in upper arm, thigh, or abdomen   Taking   Allergies  Allergen Reactions  . Bee Venom Swelling    Social History  Substance Use Topics  . Smoking status: Never Smoker  . Smokeless tobacco: Never Used  . Alcohol use No       Review of Systems  Constitutional: Positive for malaise/fatigue.  HENT: Negative.   Eyes: Negative.   Respiratory: Negative.   Cardiovascular: Negative.   Gastrointestinal: Negative.   Genitourinary: Negative.   Musculoskeletal: Positive for back pain and joint pain.  Skin: Negative.   Neurological: Negative.   Endo/Heme/Allergies: Positive for environmental allergies.  Psychiatric/Behavioral: Negative.     Objective:  Physical Exam  Constitutional: She is oriented to person, place, and time. She appears well-developed.  HENT:  Head: Normocephalic.  Eyes: Pupils are equal, round, and reactive to light.  Neck: Neck supple. No JVD present. No tracheal deviation present. No thyromegaly present.  Cardiovascular: Normal rate, regular rhythm and intact distal pulses.   Respiratory: Effort normal and breath sounds normal. No respiratory distress. She has no wheezes.  GI: Soft. There is no tenderness. There is no guarding.  Musculoskeletal:       Left knee: She exhibits decreased  range of motion, swelling and bony tenderness. She exhibits no ecchymosis, no deformity, no laceration and no erythema. Tenderness found.  Lymphadenopathy:    She has no cervical adenopathy.  Neurological: She is alert and oriented to person, place, and time.  Skin: Skin is warm and dry.  Psychiatric: She has a normal mood and affect.      Labs:  Estimated body mass index is 36.83 kg/m as calculated from the following:   Height as of 07/30/17: 5\' 6"  (1.676 m).   Weight as of 07/30/17: 103.5 kg (228 lb 3.2 oz).   Imaging Review Plain radiographs demonstrate severe degenerative joint disease of the left knee(s).  The bone quality appears to be good for age and reported activity level.  Assessment/Plan:  End stage arthritis, left knee   The patient history, physical examination, clinical judgment of the provider and imaging studies  are consistent with end stage degenerative joint disease of the left knee(s) and total knee arthroplasty is deemed medically necessary. The treatment options including medical management, injection therapy arthroscopy and arthroplasty were discussed at length. The risks and benefits of total knee arthroplasty were presented and reviewed. The risks due to aseptic loosening, infection, stiffness, patella tracking problems, thromboembolic complications and other imponderables were discussed. The patient acknowledged the explanation, agreed to proceed with the plan and consent was signed. Patient is being admitted for inpatient treatment for surgery, pain control, PT, OT, prophylactic antibiotics, VTE prophylaxis, progressive ambulation and ADL's and discharge planning. The patient is planning to be discharged home.      [place] Pugh Alicja Everitt   PA-C  08/27/2017, 12:48 PM

## 2017-08-31 NOTE — Patient Instructions (Signed)
Summer [name]  08/31/2017   Your procedure is scheduled on: 09/07/17    Report to [facility] Main  Entrance .  Report to admitting at    115pm   Call this number if you have problems the morning of surgery [phone]   Remember: ONLY 1 PERSON MAY GO WITH YOU TO SHORT STAY TO GET  READY MORNING OF YOUR SURGERY.  Do not eat food or drink liquids :After Midnight.     Take these medicines the morning of surgery with A SIP OF WATER: Flonase if needed, lamictal, eye drops as usual                                 You may not have any metal on your body including hair pins and              piercings  Do not wear jewelry, make-up, lotions, powders or perfumes, deodorant             Do not wear nail polish.  Do not shave  48 hours prior to surgery.     Do not bring valuables to the hospital. [place].  Contacts, dentures or bridgework may not be worn into surgery.  Leave suitcase in the car. After surgery it may be brought to your room.                   Please read over the following fact sheets you were given: _____________________________________________________________________                CLEAR LIQUID DIET   Foods Allowed                                                                     Foods Excluded  Coffee and tea, regular and decaf                             liquids that you cannot  Plain Jell-O in any flavor                                             see through such as: Fruit ices (not with fruit pulp)                                     milk, soups, orange juice  Iced Popsicles                                    All solid food Carbonated beverages, regular and diet  Cranberry, grape and apple juices Sports drinks like Gatorade Lightly seasoned clear broth or consume(fat free) Sugar, honey syrup  Sample Menu Breakfast                                 Lunch                                     Supper Cranberry juice                    Beef broth                            Chicken broth Jell-O                                     Grape juice                           Apple juice Coffee or tea                        Jell-O                                      Popsicle                                                Coffee or tea                        Coffee or tea  _____________________________________________________________________  [facility] - Preparing for Surgery Before surgery, you can play an important role.  Because skin is not sterile, your skin needs to be as free of germs as possible.  You can reduce the number of germs on your skin by washing with CHG (chlorahexidine gluconate) soap before surgery.  CHG is an antiseptic cleaner which kills germs and bonds with the skin to continue killing germs even after washing. Please DO NOT use if you have an allergy to CHG or antibacterial soaps.  If your skin becomes reddened/irritated stop using the CHG and inform your nurse when you arrive at Short Stay. Do not shave (including legs and underarms) for at least 48 hours prior to the first CHG shower.  You may shave your face/neck. Please follow these instructions carefully:  1.  Shower with CHG Soap the night before surgery and the  morning of Surgery.  2.  If you choose to wash your hair, wash your hair first as usual with your  normal  shampoo.  3.  After you shampoo, rinse your hair and body thoroughly to remove the  shampoo.                           4.  Use CHG as you would any other liquid soap.  You can apply chg directly  to the skin and wash  Gently with a scrungie or clean washcloth.  5.  Apply the CHG Soap to your body ONLY FROM THE NECK DOWN.   Do not use on face/ open                           Wound or open sores. Avoid contact with eyes, ears mouth and genitals (private parts).                        Wash face,  Genitals (private parts) with your normal soap.             6.  Wash thoroughly, paying special attention to the area where your surgery  will be performed.  7.  Thoroughly rinse your body with warm water from the neck down.  8.  DO NOT shower/wash with your normal soap after using and rinsing off  the CHG Soap.                9.  Pat yourself dry with a clean towel.            10.  Wear clean pajamas.            11.  Place clean sheets on your bed the night of your first shower and do not  sleep with pets. Day of Surgery : Do not apply any lotions/deodorants the morning of surgery.  Please wear clean clothes to the hospital/surgery center.  FAILURE TO FOLLOW THESE INSTRUCTIONS MAY RESULT IN THE CANCELLATION OF YOUR SURGERY PATIENT SIGNATURE_________________________________  NURSE SIGNATURE__________________________________  ________________________________________________________________________  WHAT IS A BLOOD TRANSFUSION? Blood Transfusion Information  A transfusion is the replacement of blood or some of its parts. Blood is made up of multiple cells which provide different functions.  Red blood cells carry oxygen and are used for blood loss replacement.  White blood cells fight against infection.  Platelets control bleeding.  Plasma helps clot blood.  Other blood products are available for specialized needs, such as hemophilia or other clotting disorders. BEFORE THE TRANSFUSION  Who gives blood for transfusions?   Healthy volunteers who are fully evaluated to make sure their blood is safe. This is blood bank blood. Transfusion therapy is the safest it has ever been in the practice of medicine. Before blood is taken from a donor, a complete history is taken to make sure that person has no history of diseases nor engages in risky social behavior (examples are intravenous drug use or sexual activity with multiple partners). The donor's travel history is screened to  minimize risk of transmitting infections, such as malaria. The donated blood is tested for signs of infectious diseases, such as HIV and hepatitis. The blood is then tested to be sure it is compatible with you in order to minimize the chance of a transfusion reaction. If you or a relative donates blood, this is often done in anticipation of surgery and is not appropriate for emergency situations. It takes many days to process the donated blood. RISKS AND COMPLICATIONS Although transfusion therapy is very safe and saves many lives, the main dangers of transfusion include:   Getting an infectious disease.  Developing a transfusion reaction. This is an allergic reaction to something in the blood you were given. Every precaution is taken to prevent this. The decision to have a blood transfusion has been considered carefully by your caregiver before blood is given. Blood is not given unless the benefits  outweigh the risks. AFTER THE TRANSFUSION  Right after receiving a blood transfusion, you will usually feel much better and more energetic. This is especially true if your red blood cells have gotten low (anemic). The transfusion raises the level of the red blood cells which carry oxygen, and this usually causes an energy increase.  The nurse administering the transfusion will monitor you carefully for complications. HOME CARE INSTRUCTIONS  No special instructions are needed after a transfusion. You may find your energy is better. Speak with your caregiver about any limitations on activity for underlying diseases you may have. SEEK MEDICAL CARE IF:   Your condition is not improving after your transfusion.  You develop redness or irritation at the intravenous (IV) site. SEEK IMMEDIATE MEDICAL CARE IF:  Any of the following symptoms occur over the next 12 hours:  Shaking chills.  You have a temperature by mouth above 102 F (38.9 C), not controlled by medicine.  Chest, back, or muscle  pain.  People around you feel you are not acting correctly or are confused.  Shortness of breath or difficulty breathing.  Dizziness and fainting.  You get a rash or develop hives.  You have a decrease in urine output.  Your urine turns a dark color or changes to pink, red, or brown. Any of the following symptoms occur over the next 10 days:  You have a temperature by mouth above 102 F (38.9 C), not controlled by medicine.  Shortness of breath.  Weakness after normal activity.  The white part of the eye turns yellow (jaundice).  You have a decrease in the amount of urine or are urinating less often.  Your urine turns a dark color or changes to pink, red, or brown. Document Released: 10/09/2000 Document Revised: 01/04/2012 Document Reviewed: 05/28/2008 ExitCare Patient Information 2014 [place].  _______________________________________________________________________  Incentive Spirometer  An incentive spirometer is a tool that can help keep your lungs clear and active. This tool measures how well you are filling your lungs with each breath. Taking long deep breaths may help reverse or decrease the chance of developing breathing (pulmonary) problems (especially infection) following:  A long period of time when you are unable to move or be active. BEFORE THE PROCEDURE   If the spirometer includes an indicator to show your best effort, your nurse or respiratory therapist will set it to a desired goal.  If possible, sit up straight or lean slightly forward. Try not to slouch.  Hold the incentive spirometer in an upright position. INSTRUCTIONS FOR USE  1. Sit on the edge of your bed if possible, or sit up as far as you can in bed or on a chair. 2. Hold the incentive spirometer in an upright position. 3. Breathe out normally. 4. Place the mouthpiece in your mouth and seal your lips tightly around it. 5. Breathe in slowly and as deeply as possible, raising the piston  or the ball toward the top of the column. 6. Hold your breath for 3-5 seconds or for as long as possible. Allow the piston or ball to fall to the bottom of the column. 7. Remove the mouthpiece from your mouth and breathe out normally. 8. Rest for a few seconds and repeat Steps 1 through 7 at least 10 times every 1-2 hours when you are awake. Take your time and take a few normal breaths between deep breaths. 9. The spirometer may include an indicator to show your best effort. Use the indicator as a goal to  work toward during each repetition. 10. After each set of 10 deep breaths, practice coughing to be sure your lungs are clear. If you have an incision (the cut made at the time of surgery), support your incision when coughing by placing a pillow or rolled up towels firmly against it. Once you are able to get out of bed, walk around indoors and cough well. You may stop using the incentive spirometer when instructed by your caregiver.  RISKS AND COMPLICATIONS  Take your time so you do not get dizzy or light-headed.  If you are in pain, you may need to take or ask for pain medication before doing incentive spirometry. It is harder to take a deep breath if you are having pain. AFTER USE  Rest and breathe slowly and easily.  It can be helpful to keep track of a log of your progress. Your caregiver can provide you with a simple table to help with this. If you are using the spirometer at home, follow these instructions: [place] IF:   You are having difficultly using the spirometer.  You have trouble using the spirometer as often as instructed.  Your pain medication is not giving enough relief while using the spirometer.  You develop fever of 100.5 F (38.1 C) or higher. SEEK IMMEDIATE MEDICAL CARE IF:   You cough up bloody sputum that had not been present before.  You develop fever of 102 F (38.9 C) or greater.  You develop worsening pain at or near the incision site. MAKE  SURE YOU:   Understand these instructions.  Will watch your condition.  Will get help right away if you are not doing well or get worse. Document Released: 02/22/2007 Document Revised: 01/04/2012 Document Reviewed: 04/25/2007 [facility] Patient Information 2014 [place], [place].   ________________________________________________________________________

## 2017-09-01 ENCOUNTER — Other Ambulatory Visit: Payer: Self-pay

## 2017-09-01 ENCOUNTER — Encounter (HOSPITAL_COMMUNITY)
Admission: RE | Admit: 2017-09-01 | Discharge: 2017-09-01 | Disposition: A | Discharge status: Home / Self Care | Payer: Medicare Other | Source: Ambulatory Visit | Attending: Orthopedic Surgery | Admitting: Orthopedic Surgery

## 2017-09-01 ENCOUNTER — Encounter (HOSPITAL_COMMUNITY): Payer: Self-pay

## 2017-09-01 DIAGNOSIS — M1712 Unilateral primary osteoarthritis, left knee: Secondary | ICD-10-CM | POA: Insufficient documentation

## 2017-09-01 DIAGNOSIS — Z01812 Encounter for preprocedural laboratory examination: Secondary | ICD-10-CM | POA: Diagnosis not present

## 2017-09-01 HISTORY — DX: Sleep apnea, unspecified: G47.30

## 2017-09-01 LAB — BASIC METABOLIC PANEL
Anion gap: 9 (ref 5–15)
BUN: 15 mg/dL (ref 6–20)
CALCIUM: 9.2 mg/dL (ref 8.9–10.3)
CO2: 27 mmol/L (ref 22–32)
CREATININE: 0.6 mg/dL (ref 0.44–1.00)
Chloride: 103 mmol/L (ref 101–111)
GFR calc non Af Amer: >60 mL/min (ref >60)
Glucose, Bld: 93 mg/dL (ref 65–99)
Potassium: 4.4 mmol/L (ref 3.5–5.1)
SODIUM: 139 mmol/L (ref 135–145)

## 2017-09-01 LAB — CBC
HCT: 40.4 % (ref 36.0–46.0)
Hemoglobin: 13.2 g/dL (ref 12.0–15.0)
MCH: 27.8 pg (ref 26.0–34.0)
MCHC: 32.7 g/dL (ref 30.0–36.0)
MCV: 85.1 fL (ref 78.0–100.0)
PLATELETS: 265 10*3/uL (ref 150–400)
RBC: 4.75 MIL/uL (ref 3.87–5.11)
RDW: 16.9 % — AB (ref 11.5–15.5)
WBC: 6.3 10*3/uL (ref 4.0–10.5)

## 2017-09-01 LAB — SURGICAL PCR SCREEN
MRSA, PCR: NEGATIVE
STAPHYLOCOCCUS AUREUS: NEGATIVE

## 2017-09-02 LAB — ABO/RH: ABO/RH(D): O POS

## 2017-09-02 NOTE — Progress Notes (Signed)
EKG-07/30/17-epic

## 2017-09-02 NOTE — Progress Notes (Signed)
Reqeusted on 09/02/2017 preop clearance from Dr [name].

## 2017-09-02 NOTE — Progress Notes (Signed)
03/12/17- Clearance - dr Weston Settle- Neurology on chart from epilepsy standpoint  07/30/17-Clearance- Dr Milagros Loll on chart

## 2017-09-06 ENCOUNTER — Telehealth: Payer: Self-pay | Admitting: Family Medicine

## 2017-09-06 NOTE — Telephone Encounter (Signed)
Labs from 07/30/17 faxed to number given.

## 2017-09-06 NOTE — Telephone Encounter (Signed)
Patient called in reference to needing most recent blood work results faxed to Dr. Cathie Olden with Hematology at [phone]. Please call and advise patient when fax sent. OK to leave detailed message.

## 2017-09-07 ENCOUNTER — Other Ambulatory Visit: Payer: Self-pay

## 2017-09-07 ENCOUNTER — Inpatient Hospital Stay (HOSPITAL_COMMUNITY): Payer: Medicare Other | Admitting: Anesthesiology

## 2017-09-07 ENCOUNTER — Inpatient Hospital Stay (HOSPITAL_COMMUNITY)
Admission: RE | Admit: 2017-09-07 | Discharge: 2017-09-09 | DRG: 470 | Disposition: A | Discharge status: Home / Self Care | Payer: Medicare Other | Source: Ambulatory Visit | Attending: Orthopedic Surgery | Admitting: Orthopedic Surgery

## 2017-09-07 ENCOUNTER — Encounter (HOSPITAL_COMMUNITY)
Admission: RE | Disposition: A | Discharge status: Home / Self Care | Payer: Self-pay | Source: Ambulatory Visit | Attending: Orthopedic Surgery

## 2017-09-07 ENCOUNTER — Encounter (HOSPITAL_COMMUNITY): Payer: Self-pay | Admitting: Anesthesiology

## 2017-09-07 DIAGNOSIS — Z96659 Presence of unspecified artificial knee joint: Secondary | ICD-10-CM

## 2017-09-07 DIAGNOSIS — Z6837 Body mass index (BMI) 37.0-37.9, adult: Secondary | ICD-10-CM

## 2017-09-07 DIAGNOSIS — Z79899 Other long term (current) drug therapy: Secondary | ICD-10-CM

## 2017-09-07 DIAGNOSIS — Z91030 Bee allergy status: Secondary | ICD-10-CM

## 2017-09-07 DIAGNOSIS — E669 Obesity, unspecified: Secondary | ICD-10-CM | POA: Diagnosis present

## 2017-09-07 DIAGNOSIS — M810 Age-related osteoporosis without current pathological fracture: Secondary | ICD-10-CM | POA: Diagnosis present

## 2017-09-07 DIAGNOSIS — M659 Synovitis and tenosynovitis, unspecified: Secondary | ICD-10-CM | POA: Diagnosis present

## 2017-09-07 DIAGNOSIS — M1712 Unilateral primary osteoarthritis, left knee: Principal | ICD-10-CM | POA: Diagnosis present

## 2017-09-07 DIAGNOSIS — M25462 Effusion, left knee: Secondary | ICD-10-CM | POA: Diagnosis present

## 2017-09-07 DIAGNOSIS — M19011 Primary osteoarthritis, right shoulder: Secondary | ICD-10-CM | POA: Diagnosis present

## 2017-09-07 DIAGNOSIS — Z96652 Presence of left artificial knee joint: Secondary | ICD-10-CM

## 2017-09-07 DIAGNOSIS — G40909 Epilepsy, unspecified, not intractable, without status epilepticus: Secondary | ICD-10-CM | POA: Diagnosis present

## 2017-09-07 DIAGNOSIS — G4730 Sleep apnea, unspecified: Secondary | ICD-10-CM | POA: Diagnosis present

## 2017-09-07 DIAGNOSIS — Z9884 Bariatric surgery status: Secondary | ICD-10-CM

## 2017-09-07 HISTORY — PX: TOTAL KNEE ARTHROPLASTY: SHX125

## 2017-09-07 LAB — TYPE AND SCREEN
ABO/RH(D): O POS
Antibody Screen: NEGATIVE

## 2017-09-07 SURGERY — ARTHROPLASTY, KNEE, TOTAL
Anesthesia: Spinal | Site: Knee | Laterality: Left

## 2017-09-07 MED ORDER — METHOCARBAMOL 500 MG PO TABS
500.0000 mg | ORAL_TABLET | Freq: Four times a day (QID) | ORAL | Status: DC | PRN
  Administered 2017-09-08 – 2017-09-09 (×4): 500 mg via ORAL
  Filled 2017-09-07 (×4): qty 1

## 2017-09-07 MED ORDER — ASPIRIN 81 MG PO CHEW: 81.0000 mg | CHEWABLE_TABLET | Freq: Two times a day (BID) | ORAL | 0 refills | Status: AC

## 2017-09-07 MED ORDER — FERROUS SULFATE 325 (65 FE) MG PO TABS
325.0000 mg | ORAL_TABLET | Freq: Three times a day (TID) | ORAL | Status: DC
  Administered 2017-09-08 – 2017-09-09 (×4): 325 mg via ORAL
  Filled 2017-09-07 (×4): qty 1

## 2017-09-07 MED ORDER — MEPERIDINE HCL 50 MG/ML IJ SOLN
6.2500 mg | INTRAMUSCULAR | Status: DC | PRN
Start: 2017-09-07 — End: 2017-09-07

## 2017-09-07 MED ORDER — PROMETHAZINE HCL 25 MG/ML IJ SOLN: 6.2500 mg | INTRAMUSCULAR | Status: DC | PRN

## 2017-09-07 MED ORDER — ONDANSETRON HCL 4 MG/2ML IJ SOLN
INTRAMUSCULAR | Status: DC | PRN
  Administered 2017-09-07: 4 mg via INTRAVENOUS

## 2017-09-07 MED ORDER — PROPOFOL 10 MG/ML IV BOLUS
INTRAVENOUS | Status: AC
  Filled 2017-09-07: qty 20

## 2017-09-07 MED ORDER — ONDANSETRON HCL 4 MG/2ML IJ SOLN: 4.0000 mg | Freq: Four times a day (QID) | INTRAMUSCULAR | Status: DC | PRN

## 2017-09-07 MED ORDER — TRANEXAMIC ACID 1000 MG/10ML IV SOLN
1000.0000 mg | INTRAVENOUS | Status: AC
  Administered 2017-09-07: 1000 mg via INTRAVENOUS
  Filled 2017-09-07: qty 1100

## 2017-09-07 MED ORDER — DEXAMETHASONE SODIUM PHOSPHATE 10 MG/ML IJ SOLN
10.0000 mg | Freq: Once | INTRAMUSCULAR | Status: DC
  Filled 2017-09-07: qty 1

## 2017-09-07 MED ORDER — DOCUSATE SODIUM 100 MG PO CAPS
100.0000 mg | ORAL_CAPSULE | Freq: Two times a day (BID) | ORAL | 0 refills | Status: DC
Start: 2017-09-07 — End: 2017-11-19

## 2017-09-07 MED ORDER — PROPOFOL 500 MG/50ML IV EMUL
INTRAVENOUS | Status: DC | PRN
  Administered 2017-09-07: 50 ug/kg/min via INTRAVENOUS

## 2017-09-07 MED ORDER — CEFAZOLIN SODIUM-DEXTROSE 2-4 GM/100ML-% IV SOLN
INTRAVENOUS | Status: AC
  Filled 2017-09-07: qty 100

## 2017-09-07 MED ORDER — FLUTICASONE PROPIONATE 50 MCG/ACT NA SUSP
2.0000 | Freq: Every day | NASAL | Status: DC | PRN
  Filled 2017-09-07: qty 16

## 2017-09-07 MED ORDER — BISACODYL 10 MG RE SUPP: 10.0000 mg | Freq: Every day | RECTAL | Status: DC | PRN

## 2017-09-07 MED ORDER — CEFAZOLIN SODIUM-DEXTROSE 2-4 GM/100ML-% IV SOLN
2.0000 g | Freq: Four times a day (QID) | INTRAVENOUS | Status: AC
  Administered 2017-09-07 (×2): 2 g via INTRAVENOUS
  Filled 2017-09-07 (×2): qty 100

## 2017-09-07 MED ORDER — LEVETIRACETAM 500 MG PO TABS
500.0000 mg | ORAL_TABLET | Freq: Two times a day (BID) | ORAL | Status: DC
  Administered 2017-09-07 – 2017-09-09 (×4): 500 mg via ORAL
  Filled 2017-09-07 (×4): qty 1

## 2017-09-07 MED ORDER — CHLORHEXIDINE GLUCONATE 4 % EX LIQD: 60.0000 mL | Freq: Once | CUTANEOUS | Status: DC

## 2017-09-07 MED ORDER — ONDANSETRON HCL 4 MG PO TABS: 4.0000 mg | ORAL_TABLET | Freq: Four times a day (QID) | ORAL | Status: DC | PRN

## 2017-09-07 MED ORDER — SODIUM CHLORIDE 0.9 % IJ SOLN
INTRAMUSCULAR | Status: DC | PRN
  Administered 2017-09-07: 29 mL

## 2017-09-07 MED ORDER — MENTHOL 3 MG MT LOZG: 1.0000 | LOZENGE | OROMUCOSAL | Status: DC | PRN

## 2017-09-07 MED ORDER — 0.9 % SODIUM CHLORIDE (POUR BTL) OPTIME
TOPICAL | Status: DC | PRN
  Administered 2017-09-07: 1000 mL

## 2017-09-07 MED ORDER — STERILE WATER FOR IRRIGATION IR SOLN
Status: DC | PRN
  Administered 2017-09-07: 2000 mL

## 2017-09-07 MED ORDER — CEFAZOLIN SODIUM-DEXTROSE 2-4 GM/100ML-% IV SOLN
2.0000 g | INTRAVENOUS | Status: AC
  Administered 2017-09-07: 2 g via INTRAVENOUS

## 2017-09-07 MED ORDER — SODIUM CHLORIDE 0.9 % IV SOLN
1000.0000 mg | Freq: Once | INTRAVENOUS | Status: AC
  Administered 2017-09-07: 16:00:00 1000 mg via INTRAVENOUS
  Filled 2017-09-07: qty 1100

## 2017-09-07 MED ORDER — POLYETHYLENE GLYCOL 3350 17 G PO PACK
17.0000 g | PACK | Freq: Two times a day (BID) | ORAL | Status: DC
  Administered 2017-09-08 – 2017-09-09 (×3): 17 g via ORAL
  Filled 2017-09-07 (×3): qty 1

## 2017-09-07 MED ORDER — HYDROMORPHONE HCL 1 MG/ML IJ SOLN: 0.2500 mg | INTRAMUSCULAR | Status: DC | PRN

## 2017-09-07 MED ORDER — HYDROMORPHONE HCL 1 MG/ML IJ SOLN
0.5000 mg | INTRAMUSCULAR | Status: DC | PRN
  Administered 2017-09-07 – 2017-09-08 (×3): 1 mg via INTRAVENOUS
  Filled 2017-09-07 (×3): qty 1

## 2017-09-07 MED ORDER — DEXTROSE 5 % IV SOLN
500.0000 mg | Freq: Four times a day (QID) | INTRAVENOUS | Status: DC | PRN
  Administered 2017-09-07: 500 mg via INTRAVENOUS
  Filled 2017-09-07: qty 550

## 2017-09-07 MED ORDER — HYDROCODONE-ACETAMINOPHEN 7.5-325 MG PO TABS: 1.0000 | ORAL_TABLET | ORAL | 0 refills | Status: DC | PRN

## 2017-09-07 MED ORDER — DOCUSATE SODIUM 100 MG PO CAPS
100.0000 mg | ORAL_CAPSULE | Freq: Two times a day (BID) | ORAL | Status: DC
  Administered 2017-09-07 – 2017-09-09 (×4): 100 mg via ORAL
  Filled 2017-09-07 (×4): qty 1

## 2017-09-07 MED ORDER — KETOROLAC TROMETHAMINE 30 MG/ML IJ SOLN
INTRAMUSCULAR | Status: DC | PRN
  Administered 2017-09-07: 30 mg

## 2017-09-07 MED ORDER — CELECOXIB 200 MG PO CAPS
200.0000 mg | ORAL_CAPSULE | Freq: Two times a day (BID) | ORAL | Status: DC
  Administered 2017-09-07 – 2017-09-09 (×4): 200 mg via ORAL
  Filled 2017-09-07 (×4): qty 1

## 2017-09-07 MED ORDER — METHOCARBAMOL 500 MG PO TABS: 500.0000 mg | ORAL_TABLET | Freq: Four times a day (QID) | ORAL | 0 refills | Status: DC | PRN

## 2017-09-07 MED ORDER — ONDANSETRON HCL 4 MG/2ML IJ SOLN
INTRAMUSCULAR | Status: AC
  Filled 2017-09-07: qty 2

## 2017-09-07 MED ORDER — ACETAMINOPHEN 650 MG RE SUPP: 650.0000 mg | RECTAL | Status: DC | PRN

## 2017-09-07 MED ORDER — FERROUS SULFATE 325 (65 FE) MG PO TABS: 325.0000 mg | ORAL_TABLET | Freq: Three times a day (TID) | ORAL | 3 refills | Status: DC

## 2017-09-07 MED ORDER — KETOROLAC TROMETHAMINE 30 MG/ML IJ SOLN
INTRAMUSCULAR | Status: AC
  Filled 2017-09-07: qty 1

## 2017-09-07 MED ORDER — PROPOFOL 10 MG/ML IV BOLUS
INTRAVENOUS | Status: DC | PRN
  Administered 2017-09-07: 20 mg via INTRAVENOUS
  Administered 2017-09-07: 30 mg via INTRAVENOUS

## 2017-09-07 MED ORDER — DEXAMETHASONE SODIUM PHOSPHATE 10 MG/ML IJ SOLN
10.0000 mg | Freq: Once | INTRAMUSCULAR | Status: AC
  Administered 2017-09-07: 10 mg via INTRAVENOUS

## 2017-09-07 MED ORDER — SODIUM CHLORIDE 0.9 % IR SOLN
Status: DC | PRN
  Administered 2017-09-07: 1000 mL

## 2017-09-07 MED ORDER — ALUM & MAG HYDROXIDE-SIMETH 200-200-20 MG/5ML PO SUSP: 15.0000 mL | ORAL | Status: DC | PRN

## 2017-09-07 MED ORDER — DIPHENHYDRAMINE HCL 12.5 MG/5ML PO ELIX: 12.5000 mg | ORAL_SOLUTION | ORAL | Status: DC | PRN

## 2017-09-07 MED ORDER — BUPIVACAINE-EPINEPHRINE (PF) 0.25% -1:200000 IJ SOLN
INTRAMUSCULAR | Status: DC | PRN
  Administered 2017-09-07: 30 mL

## 2017-09-07 MED ORDER — KETOTIFEN FUMARATE 0.025 % OP SOLN
1.0000 [drp] | Freq: Every day | OPHTHALMIC | Status: DC
  Administered 2017-09-07 – 2017-09-09 (×3): 1 [drp] via OPHTHALMIC
  Filled 2017-09-07 (×2): qty 5

## 2017-09-07 MED ORDER — METOCLOPRAMIDE HCL 5 MG/ML IJ SOLN: 5.0000 mg | Freq: Three times a day (TID) | INTRAMUSCULAR | Status: DC | PRN

## 2017-09-07 MED ORDER — SODIUM CHLORIDE 0.9 % IJ SOLN
INTRAMUSCULAR | Status: AC
  Filled 2017-09-07: qty 50

## 2017-09-07 MED ORDER — PHENYLEPHRINE HCL 10 MG/ML IJ SOLN
INTRAMUSCULAR | Status: DC | PRN
  Administered 2017-09-07 (×5): 80 ug via INTRAVENOUS

## 2017-09-07 MED ORDER — FENTANYL CITRATE (PF) 100 MCG/2ML IJ SOLN
INTRAMUSCULAR | Status: AC
  Administered 2017-09-07: 50 ug
  Filled 2017-09-07: qty 2

## 2017-09-07 MED ORDER — HYDROCODONE-ACETAMINOPHEN 7.5-325 MG PO TABS
2.0000 | ORAL_TABLET | ORAL | Status: DC | PRN
  Administered 2017-09-07 – 2017-09-09 (×6): 2 via ORAL
  Filled 2017-09-07 (×7): qty 2

## 2017-09-07 MED ORDER — ASPIRIN 81 MG PO CHEW
81.0000 mg | CHEWABLE_TABLET | Freq: Two times a day (BID) | ORAL | Status: DC
  Administered 2017-09-07 – 2017-09-09 (×4): 81 mg via ORAL
  Filled 2017-09-07 (×4): qty 1

## 2017-09-07 MED ORDER — POLYETHYLENE GLYCOL 3350 17 G PO PACK: 17.0000 g | PACK | Freq: Two times a day (BID) | ORAL | 0 refills | Status: DC

## 2017-09-07 MED ORDER — MAGNESIUM CITRATE PO SOLN: 1.0000 | Freq: Once | ORAL | Status: DC | PRN

## 2017-09-07 MED ORDER — PROPOFOL 10 MG/ML IV BOLUS
INTRAVENOUS | Status: AC
  Filled 2017-09-07: qty 60

## 2017-09-07 MED ORDER — LACTATED RINGERS IV SOLN
INTRAVENOUS | Status: DC
  Administered 2017-09-07: 12:00:00 via INTRAVENOUS
  Administered 2017-09-07: 1000 mL via INTRAVENOUS
  Administered 2017-09-07: 13:00:00 via INTRAVENOUS

## 2017-09-07 MED ORDER — MIDAZOLAM HCL 2 MG/2ML IJ SOLN
INTRAMUSCULAR | Status: AC
  Administered 2017-09-07: 2 mg
  Filled 2017-09-07: qty 2

## 2017-09-07 MED ORDER — PHENOL 1.4 % MT LIQD: 1.0000 | OROMUCOSAL | Status: DC | PRN

## 2017-09-07 MED ORDER — LACTATED RINGERS IV SOLN: INTRAVENOUS | Status: DC

## 2017-09-07 MED ORDER — BUPIVACAINE-EPINEPHRINE (PF) 0.25% -1:200000 IJ SOLN
INTRAMUSCULAR | Status: AC
  Filled 2017-09-07: qty 30

## 2017-09-07 MED ORDER — ACETAMINOPHEN 325 MG PO TABS: 650.0000 mg | ORAL_TABLET | ORAL | Status: DC | PRN

## 2017-09-07 MED ORDER — SODIUM CHLORIDE 0.9 % IV SOLN
INTRAVENOUS | Status: DC
  Administered 2017-09-07 – 2017-09-08 (×2): via INTRAVENOUS

## 2017-09-07 MED ORDER — HYDROCODONE-ACETAMINOPHEN 7.5-325 MG PO TABS
1.0000 | ORAL_TABLET | ORAL | Status: DC | PRN
  Administered 2017-09-07 – 2017-09-09 (×2): 1 via ORAL
  Filled 2017-09-07 (×2): qty 1

## 2017-09-07 MED ORDER — DEXAMETHASONE SODIUM PHOSPHATE 10 MG/ML IJ SOLN
INTRAMUSCULAR | Status: AC
  Filled 2017-09-07: qty 1

## 2017-09-07 MED ORDER — BUPIVACAINE IN DEXTROSE 0.75-8.25 % IT SOLN
INTRATHECAL | Status: DC | PRN
  Administered 2017-09-07: 2 mL via INTRATHECAL

## 2017-09-07 MED ORDER — LAMOTRIGINE 25 MG PO TABS
150.0000 mg | ORAL_TABLET | Freq: Two times a day (BID) | ORAL | Status: DC
  Administered 2017-09-07 – 2017-09-09 (×4): 150 mg via ORAL
  Filled 2017-09-07: qty 1
  Filled 2017-09-07: qty 2
  Filled 2017-09-07: qty 1
  Filled 2017-09-07: qty 2

## 2017-09-07 MED ORDER — METOCLOPRAMIDE HCL 5 MG PO TABS: 5.0000 mg | ORAL_TABLET | Freq: Three times a day (TID) | ORAL | Status: DC | PRN

## 2017-09-07 SURGICAL SUPPLY — 46 items
BAG ZIPLOCK 12X15 (MISCELLANEOUS) ×3 IMPLANT
BANDAGE ACE 6X5 VEL STRL LF (GAUZE/BANDAGES/DRESSINGS) ×3 IMPLANT
BLADE SAW SGTL 11.0X1.19X90.0M (BLADE) IMPLANT
BLADE SAW SGTL 13.0X1.19X90.0M (BLADE) ×3 IMPLANT
BOWL SMART MIX CTS (DISPOSABLE) ×3 IMPLANT
CAPT KNEE TOTAL 3 ATTUNE ×3 IMPLANT
CEMENT HV SMART SET (Cement) ×6 IMPLANT
COVER SURGICAL LIGHT HANDLE (MISCELLANEOUS) ×3 IMPLANT
CUFF TOURN SGL QUICK 34 (TOURNIQUET CUFF) ×2
CUFF TRNQT CYL 34X4X40X1 (TOURNIQUET CUFF) ×1 IMPLANT
DECANTER SPIKE VIAL GLASS SM (MISCELLANEOUS) ×3 IMPLANT
DERMABOND ADVANCED (GAUZE/BANDAGES/DRESSINGS) ×2
DERMABOND ADVANCED .7 DNX12 (GAUZE/BANDAGES/DRESSINGS) ×1 IMPLANT
DRAPE U-SHAPE 47X51 STRL (DRAPES) ×3 IMPLANT
DRESSING AQUACEL AG SP 3.5X10 (GAUZE/BANDAGES/DRESSINGS) ×1 IMPLANT
DRSG AQUACEL AG SP 3.5X10 (GAUZE/BANDAGES/DRESSINGS) ×3
DURAPREP 26ML APPLICATOR (WOUND CARE) ×6 IMPLANT
ELECT REM PT RETURN 15FT ADLT (MISCELLANEOUS) ×3 IMPLANT
GLOVE BIOGEL PI IND STRL 6.5 (GLOVE) ×1 IMPLANT
GLOVE BIOGEL PI IND STRL 7.0 (GLOVE) ×3 IMPLANT
GLOVE BIOGEL PI IND STRL 7.5 (GLOVE) ×5 IMPLANT
GLOVE BIOGEL PI INDICATOR 6.5 (GLOVE) ×2
GLOVE BIOGEL PI INDICATOR 7.0 (GLOVE) ×6
GLOVE BIOGEL PI INDICATOR 7.5 (GLOVE) ×10
GLOVE SURG SS PI 6.5 STRL IVOR (GLOVE) ×3 IMPLANT
GLOVE SURG SS PI 7.5 STRL IVOR (GLOVE) ×12 IMPLANT
GOWN SPEC L3 XXLG W/TWL (GOWN DISPOSABLE) ×3 IMPLANT
GOWN STRL REUS W/ TWL XL LVL3 (GOWN DISPOSABLE) ×1 IMPLANT
GOWN STRL REUS W/TWL LRG LVL3 (GOWN DISPOSABLE) ×6 IMPLANT
GOWN STRL REUS W/TWL XL LVL3 (GOWN DISPOSABLE) ×5 IMPLANT
HANDPIECE INTERPULSE COAX TIP (DISPOSABLE) ×2
MANIFOLD NEPTUNE II (INSTRUMENTS) ×3 IMPLANT
PACK TOTAL KNEE CUSTOM (KITS) ×3 IMPLANT
POSITIONER SURGICAL ARM (MISCELLANEOUS) ×3 IMPLANT
SET HNDPC FAN SPRY TIP SCT (DISPOSABLE) ×1 IMPLANT
SET PAD KNEE POSITIONER (MISCELLANEOUS) ×3 IMPLANT
SUT MNCRL AB 4-0 PS2 18 (SUTURE) ×3 IMPLANT
SUT STRATAFIX 0 PDS 27 VIOLET (SUTURE) ×3
SUT VIC AB 1 CT1 36 (SUTURE) ×3 IMPLANT
SUT VIC AB 2-0 CT1 27 (SUTURE) ×6
SUT VIC AB 2-0 CT1 TAPERPNT 27 (SUTURE) ×3 IMPLANT
SUTURE STRATFX 0 PDS 27 VIOLET (SUTURE) ×1 IMPLANT
SYR 50ML LL SCALE MARK (SYRINGE) ×3 IMPLANT
TRAY FOLEY BAG SILVER LF 16FR (SET/KITS/TRAYS/PACK) ×3 IMPLANT
WRAP KNEE MAXI GEL POST OP (GAUZE/BANDAGES/DRESSINGS) ×3 IMPLANT
YANKAUER SUCT BULB TIP 10FT TU (MISCELLANEOUS) ×3 IMPLANT

## 2017-09-07 NOTE — Evaluation (Signed)
Physical Therapy Evaluation Patient Details Name: Summer [name] MRN: [id number] DOB: [birth year] Today's Date: 09/07/2017   History of Present Illness  pt s/p LTKA today (09/07/2017) . H/o of bariatric surgery, depression. Recently moved here 2 years ago to be with family .   Clinical Impression  Pt is s/p TKA resulting in the deficits listed below (see PT Problem List). Pt will benefit from acutePT to increase their independence and safety with mobility to allow discharge home.     Follow Up Recommendations Outpatient PT(OPPT set up for Friday 09/10/2017)    Equipment Recommendations  None recommended by PT    Recommendations for Other Services       Precautions / Restrictions Precautions Precautions: Knee(educated knee precuations for no pillow or bend in the  knee while resting in bed) Restrictions Weight Bearing Restrictions: No      Mobility  Bed Mobility Overal bed mobility: Needs Assistance Bed Mobility: Supine to Sit;Sit to Supine     Supine to sit: Min assist     General bed mobility comments: cues for sequencing and assist with LLE   Transfers Overall transfer level: Needs assistance Equipment used: Rolling walker (2 wheeled) Transfers: Sit to/from Stand              Ambulation/Gait Ambulation/Gait assistance: Min assist Ambulation Distance (Feet): 10 Feet(limited just due to some reports of dizziness ) Assistive device: Rolling walker (2 wheeled) Gait Pattern/deviations: Step-to pattern     General Gait Details: cues for RW safety and use   Stairs            Wheelchair Mobility    Modified Rankin (Stroke Patients Only)       Balance                                             Pertinent Vitals/Pain Pain Assessment: 0-10 Pain Score: 3  Pain Location: L knee area Pain Descriptors / Indicators: Aching Pain Intervention(s): Monitored during session;Premedicated before session;Ice applied    Home Living  Family/patient expects to be discharged to:: Private residence Living Arrangements: Spouse/significant other Available Help at Discharge: Family Type of Home: House Home Access: Stairs to enter Entrance Stairs-Rails: Right Entrance Stairs-Number of Steps: 4 Home Layout: One level Home Equipment: Environmental consultant - 2 wheels      Prior Function Level of Independence: Independent         Comments: has been going to [employer] (silver sneakers, etc) prepping for knee surgery      Hand Dominance        Extremity/Trunk Assessment        Lower Extremity Assessment Lower Extremity Assessment: LLE deficits/detail LLE Deficits / Details: knee grossly 3-/5 for strength       Communication   Communication: No difficulties  Cognition Arousal/Alertness: Awake/alert Behavior During Therapy: WFL for tasks assessed/performed Overall Cognitive Status: Within Functional Limits for tasks assessed                                        General Comments      Exercises Total Joint Exercises Ankle Circles/Pumps: AROM;Both;Supine;10 reps Quad Sets: AROM;Left;Supine;10 reps Heel Slides: AAROM;10 reps;Left;Supine Straight Leg Raises: AAROM;10 reps;Left;Supine Goniometric ROM: 0-60   Assessment/Plan    PT Assessment Patient needs continued PT  services  PT Problem List Decreased strength;Decreased range of motion;Decreased activity tolerance;Decreased mobility       PT Treatment Interventions Therapeutic activities;DME instruction;Therapeutic exercise;Gait training;Patient/family education;Stair training;Functional mobility training    PT Goals (Current goals can be found in the Care Plan section)  Acute Rehab PT Goals Patient Stated Goal: I want to continue to be active with my grandkids  PT Goal Formulation: With patient Time For Goal Achievement: 09/14/17 Potential to Achieve Goals: Good    Frequency 7X/week   Barriers to discharge        Co-evaluation                AM-PAC PT "6 Clicks" Daily Activity  Outcome Measure Difficulty turning over in bed (including adjusting bedclothes, sheets and blankets)?: Unable Difficulty moving from lying on back to sitting on the side of the bed? : Unable Difficulty sitting down on and standing up from a chair with arms (e.g., wheelchair, bedside commode, etc,.)?: Unable Help needed moving to and from a bed to chair (including a wheelchair)?: A Little Help needed walking in hospital room?: A Little Help needed climbing 3-5 steps with a railing? : A Little 6 Click Score: 12    End of Session Equipment Utilized During Treatment: Gait belt Activity Tolerance: Patient tolerated treatment well Patient left: in chair;with call bell/phone within reach(pt agreed and unsderstands to call whenever she needs to move) Nurse Communication: Mobility status PT Visit Diagnosis: Other abnormalities of gait and mobility (R26.89)    Time: 1730-1800 PT Time Calculation (min) (ACUTE ONLY): 30 min   Charges:   PT Evaluation $PT Eval Low Complexity: 1 Low PT Treatments $Therapeutic Activity: 8-22 mins   PT G Codes:   PT G-Codes **NOT FOR INPATIENT CLASS** Functional Assessment Tool Used: AM-PAC 6 Clicks Basic Mobility;Clinical judgement Functional Limitation: Mobility: Walking and moving around Mobility: Walking and Moving Around Current Status (V2023): At least 1 percent but less than 20 percent impaired, limited or restricted Mobility: Walking and Moving Around Goal Status [phone]): At least 1 percent but less than 20 percent impaired, limited or restricted    Clide Dales, PT Pager: [phone] 09/07/2017   [name], Gatha Mayer 09/07/2017, 8:24 PM

## 2017-09-07 NOTE — Plan of Care (Signed)
done

## 2017-09-07 NOTE — Progress Notes (Signed)
PT Note  Patient Details Name: Summer [name] MRN: [id number] DOB: 03-20-1951   Pt seen for POD0 session, Tolerated well, full write up to follow.   Clide Dales 09/07/2017, 6:05 PM

## 2017-09-07 NOTE — Anesthesia Procedure Notes (Addendum)
Procedure Name: MAC Date/Time: 09/07/2017 11:46 AM Performed by: Dione Booze, CRNA Pre-anesthesia Checklist: Patient identified, Emergency Drugs available, Suction available and Patient being monitored Patient Re-evaluated:Patient Re-evaluated prior to induction Oxygen Delivery Method: Nasal cannula Placement Confirmation: positive ETCO2

## 2017-09-07 NOTE — Transfer of Care (Signed)
Immediate Anesthesia Transfer of Care Note  Patient: Summer [name]  Procedure(s) Performed: LEFT TOTAL KNEE ARTHROPLASTY (Left Knee)  Patient Location: PACU  Anesthesia Type:MAC, Regional and Spinal  Level of Consciousness: awake and patient cooperative  Airway & Oxygen Therapy: Patient Spontanous Breathing and Patient connected to nasal cannula oxygen  Post-op Assessment: Report given to RN and Post -op Vital signs reviewed and stable  Post vital signs: Reviewed and stable  Last Vitals:  Vitals:   09/07/17 1041 09/07/17 1042  BP: 126/85   Pulse: 66 70  Resp: 20 16  Temp:    SpO2: 99% 99%    Last Pain:  Vitals:   09/07/17 0909  TempSrc: Oral      Patients Stated Pain Goal: 4 (70/26/37 8588)  Complications: No apparent anesthesia complications

## 2017-09-07 NOTE — Interval H&P Note (Signed)
History and Physical Interval Note:  09/07/2017 9:36 AM  Summer [name]  has presented today for surgery, with the diagnosis of Left knee osteoarthrits  The various methods of treatment have been discussed with the patient and family. After consideration of risks, benefits and other options for treatment, the patient has consented to  Procedure(s) with comments: LEFT TOTAL KNEE ARTHROPLASTY (Left) - 70 mins as a surgical intervention .  The patient's history has been reviewed, patient examined, no change in status, stable for surgery.  I have reviewed the patient's chart and labs.  Questions were answered to the patient's satisfaction.     Mauri Pole

## 2017-09-07 NOTE — Anesthesia Preprocedure Evaluation (Addendum)
Anesthesia Evaluation  Patient identified by MRN, date of birth, ID band Patient awake    Reviewed: Allergy & Precautions, NPO status , Patient's Chart, lab work & pertinent test results  Airway Mallampati: I  TM Distance: >3 FB Neck ROM: Full    Dental  (+) Teeth Intact, Dental Advisory Given   Pulmonary sleep apnea ,    breath sounds clear to auscultation       Cardiovascular negative cardio ROS   Rhythm:Regular Rate:Normal     Neuro/Psych Seizures -,     GI/Hepatic negative GI ROS, Neg liver ROS,   Endo/Other  negative endocrine ROS  Renal/GU negative Renal ROS     Musculoskeletal  (+) Arthritis ,   Abdominal (+) + obese,   Peds  Hematology negative hematology ROS (+)   Anesthesia Other Findings Day of surgery medications reviewed with the patient.  Reproductive/Obstetrics                            Lab Results  Component Value Date   WBC 6.3 09/01/2017   HGB 13.2 09/01/2017   HCT 40.4 09/01/2017   MCV 85.1 09/01/2017   PLT 265 09/01/2017   EKG: normal sinus rhythm.  Anesthesia Physical Anesthesia Plan  ASA: II  Anesthesia Plan: Spinal   Post-op Pain Management:  Regional for Post-op pain   Induction: Intravenous  PONV Risk Score and Plan: 3 and Ondansetron, Dexamethasone, Propofol infusion and Midazolam  Airway Management Planned: Natural Airway and Nasal Cannula  Additional Equipment:   Intra-op Plan:   Post-operative Plan:   Informed Consent: I have reviewed the patients History and Physical, chart, labs and discussed the procedure including the risks, benefits and alternatives for the proposed anesthesia with the patient or authorized representative who has indicated his/her understanding and acceptance.   Dental advisory given  Plan Discussed with: CRNA  Anesthesia Plan Comments:         Anesthesia Quick Evaluation

## 2017-09-07 NOTE — Op Note (Signed)
NAME:  Summer [name]                      MEDICAL RECORD NO.:  [id number]                             FACILITY:  [facility]      PHYSICIAN:  Pietro Cassis. Alvan Dame, M.D.  DATE OF BIRTH:  1951/04/30      DATE OF PROCEDURE:  09/07/2017                                     OPERATIVE REPORT         PREOPERATIVE DIAGNOSIS:  Left knee osteoarthritis.      POSTOPERATIVE DIAGNOSIS:  Left knee osteoarthritis.      FINDINGS:  The patient was noted to have complete loss of cartilage and   bone-on-bone arthritis with associated osteophytes in the lateral and patellofemoral compartments of   the knee with a significant synovitis and associated effusion.      PROCEDURE:  Left total knee replacement.      COMPONENTS USED:  DePuy Attune rotating platform posterior stabilized knee   system, a size 5N femur, 5 tibia, size 6 mm PS AOX insert, and 35 anatomic patellar   button.      SURGEON:  Pietro Cassis. Alvan Dame, M.D.      ASSISTANT:  Nehemiah Massed, PA-C.      ANESTHESIA:  Regional and Spinal.      SPECIMENS:  None.      COMPLICATION:  None.      DRAINS:  None.  EBL: <150cc      TOURNIQUET TIME:   Total Tourniquet Time Documented: Thigh (Left) - 28 minutes Total: Thigh (Left) - 28 minutes  .      The patient was stable to the recovery room.      INDICATION FOR PROCEDURE:  Summer [name] is a 66 y.o. female patient of   mine.  The patient had been seen, evaluated, and treated conservatively in the   office with medication, activity modification, and injections.  The patient had   radiographic changes of bone-on-bone arthritis with endplate sclerosis and osteophytes noted.      The patient failed conservative measures including medication, injections, and activity modification, and at this point was ready for more definitive measures.   Based on the radiographic changes and failed conservative measures, the patient   decided to proceed with total knee replacement.  Risks of infection,   DVT,  component failure, need for revision surgery, postop course, and   expectations were all   discussed and reviewed.  Consent was obtained for benefit of pain   relief.      PROCEDURE IN DETAIL:  The patient was brought to the operative theater.   Once adequate anesthesia, preoperative antibiotics, 2 gm of Ancef, 1 gm of Tranexamic Acid, and 10 mg of Decadron administered, the patient was positioned supine with the left thigh tourniquet placed.  The  left lower extremity was prepped and draped in sterile fashion.  A time-   out was performed identifying the patient, planned procedure, and   extremity.      The left lower extremity was placed in the [facility] leg holder.  The leg was   exsanguinated, tourniquet elevated to 250 mmHg.  A midline incision was  made followed by median parapatellar arthrotomy.  Following initial   exposure, attention was first directed to the patella.  Precut   measurement was noted to be 21 mm.  I resected down to 14 mm and used a   35 anatomic patellar button to restore patellar height as well as cover the cut   surface.      The lug holes were drilled and a metal shim was placed to protect the   patella from retractors and saw blades.      At this point, attention was now directed to the femur.  The femoral   canal was opened with a drill, irrigated to try to prevent fat emboli.  An   intramedullary rod was passed at 3 degrees valgus, 9 mm of bone was   resected off the distal femur.  Following this resection, the tibia was   subluxated anteriorly.  Using the extramedullary guide, 2 mm of bone was resected off   the proximal lateral tibia.  We confirmed the gap would be   stable medially and laterally with a size 5 spacer block as well as confirmed   the cut was perpendicular in the coronal plane, checking with an alignment rod.      Once this was done, I sized the femur to be a size 5 in the anterior-   posterior dimension, chose a narrow component based on  medial and   lateral dimension.  The size 5 rotation block was then pinned in   position anterior referenced using the C-clamp to set rotation.  The   anterior, posterior, and  chamfer cuts were made without difficulty nor   notching making certain that I was along the anterior cortex to help   with flexion gap stability.      The final box cut was made off the lateral aspect of distal femur.      At this point, the tibia was sized to be a size 5, the size 5 tray was   then pinned in position through the medial third of the tubercle,   drilled, and keel punched.  Trial reduction was now carried with a 5 femur,  5 tibia, a size 5 then 6 mm PS insert, and the 35 anatomic patella botton.  The knee was brought to   extension, full extension with good flexion stability with the patella   tracking through the trochlea without application of pressure.  Given   all these findings the femoral lug holes were drilled and then the trial components removed.  Final components were   opened and cement was mixed.  The knee was irrigated with normal saline   solution and pulse lavage.  The synovial lining was   then injected with 30 cc of 0.25% Marcaine with epinephrine and 1 cc of Toradol plus 30 cc of NS for a total of 61 cc.      The knee was irrigated.  Final implants were then cemented onto clean and   dried cut surfaces of bone with the knee brought to extension with a size 6 mm PS trial insert.      Once the cement had fully cured, the excess cement was removed   throughout the knee.  I confirmed I was satisfied with the range of   motion and stability, and the final size 6 mm PS AOX insert was chosen.  It was   placed into the knee.      The tourniquet had been let  down at 28 minutes.  No significant   hemostasis required.  The   extensor mechanism was then reapproximated using #1 Vicryl and #0 Stratafix sutures with the knee   in flexion.  The   remaining wound was closed with 2-0 Vicryl and  running 4-0 Monocryl.   The knee was cleaned, dried, dressed sterilely using Dermabond and   Aquacel dressing.  The patient was then   brought to recovery room in stable condition, tolerating the procedure   well.   Please note that Physician Assistant, Nehemiah Massed, PA-C, was present for the entirety of the case, and was utilized for pre-operative positioning, peri-operative retractor management, general facilitation of the procedure.  He was also utilized for primary wound closure at the end of the case.              Pietro Cassis Alvan Dame, M.D.    09/07/2017 12:05 PM

## 2017-09-07 NOTE — Anesthesia Postprocedure Evaluation (Signed)
Anesthesia Post Note  Patient: Summer [name]  Procedure(s) Performed: LEFT TOTAL KNEE ARTHROPLASTY (Left Knee)     Patient location during evaluation: PACU Anesthesia Type: Spinal Level of consciousness: oriented and awake and alert Pain management: pain level controlled Vital Signs Assessment: post-procedure vital signs reviewed and stable Respiratory status: spontaneous breathing, respiratory function stable and patient connected to nasal cannula oxygen Cardiovascular status: blood pressure returned to baseline and stable Postop Assessment: no headache, no backache, no apparent nausea or vomiting, spinal receding and patient able to bend at knees Anesthetic complications: no    Last Vitals:  Vitals:   09/07/17 1400 09/07/17 1415  BP: 107/72 111/71  Pulse: (!) 51 (!) 57  Resp: 13 14  Temp: (!) 36.3 C (!) 36.4 C  SpO2: 100% 100%    Last Pain:  Vitals:   09/07/17 1400  TempSrc:   PainSc: 0-No pain                 Effie Berkshire

## 2017-09-07 NOTE — Anesthesia Procedure Notes (Signed)
Anesthesia Regional Block: Adductor canal block   Pre-Anesthetic Checklist: ,, timeout performed, Correct Patient, Correct Site, Correct Laterality, Correct Procedure, Correct Position, site marked, Risks and benefits discussed,  Surgical consent,  Pre-op evaluation,  At surgeon's request and post-op pain management  Laterality: Left  Prep: chloraprep       Needles:  Injection technique: Single-shot  Needle Type: Echogenic Needle     Needle Length: 9cm  Needle Gauge: 21     Additional Needles:   Procedures:,,,, ultrasound used (permanent image in chart),,,,  Narrative:  Start time: 09/07/2017 10:40 AM End time: 09/07/2017 10:50 AM Injection made incrementally with aspirations every 5 mL.  Performed by: Personally  Anesthesiologist: Effie Berkshire, MD  Additional Notes: Patient tolerated the procedure well. Local anesthetic introduced in an incremental fashion under minimal resistance after negative aspirations. No paresthesias were elicited. After completion of the procedure, no acute issues were identified and patient continued to be monitored by RN.

## 2017-09-07 NOTE — Progress Notes (Signed)
AssistedDr. Smith Robert with ultrasound guidedadductor canal block. Side rails up, monitors on throughout procedure. See vital signs in flow sheet. Tolerated Procedure well.

## 2017-09-07 NOTE — Anesthesia Procedure Notes (Signed)
Spinal  Patient location during procedure: OR Start time: 09/07/2017 10:52 AM End time: 09/07/2017 10:54 AM Staffing Anesthesiologist: Effie Berkshire, MD Performed: anesthesiologist  Preanesthetic Checklist Completed: patient identified, site marked, surgical consent, pre-op evaluation, timeout performed, IV checked, risks and benefits discussed and monitors and equipment checked Spinal Block Patient position: sitting Prep: Betadine Patient monitoring: heart rate, continuous pulse ox, blood pressure and cardiac monitor Approach: midline Location: L4-5 Injection technique: single-shot Needle Needle type: Introducer and Pencan  Needle gauge: 24 G Needle length: 9 cm Additional Notes Negative paresthesia. Negative blood return. Positive free-flowing CSF. Expiration date of kit checked and confirmed. Patient tolerated procedure well, without complications.

## 2017-09-07 NOTE — Discharge Instructions (Signed)

## 2017-09-08 DIAGNOSIS — Z79899 Other long term (current) drug therapy: Secondary | ICD-10-CM | POA: Diagnosis not present

## 2017-09-08 DIAGNOSIS — M810 Age-related osteoporosis without current pathological fracture: Secondary | ICD-10-CM | POA: Diagnosis present

## 2017-09-08 DIAGNOSIS — E669 Obesity, unspecified: Secondary | ICD-10-CM | POA: Diagnosis present

## 2017-09-08 DIAGNOSIS — G4730 Sleep apnea, unspecified: Secondary | ICD-10-CM | POA: Diagnosis present

## 2017-09-08 DIAGNOSIS — M1712 Unilateral primary osteoarthritis, left knee: Secondary | ICD-10-CM | POA: Diagnosis present

## 2017-09-08 DIAGNOSIS — Z6837 Body mass index (BMI) 37.0-37.9, adult: Secondary | ICD-10-CM | POA: Diagnosis not present

## 2017-09-08 DIAGNOSIS — M25462 Effusion, left knee: Secondary | ICD-10-CM | POA: Diagnosis present

## 2017-09-08 DIAGNOSIS — Z9884 Bariatric surgery status: Secondary | ICD-10-CM | POA: Diagnosis not present

## 2017-09-08 DIAGNOSIS — M19011 Primary osteoarthritis, right shoulder: Secondary | ICD-10-CM | POA: Diagnosis present

## 2017-09-08 DIAGNOSIS — M659 Synovitis and tenosynovitis, unspecified: Secondary | ICD-10-CM | POA: Diagnosis present

## 2017-09-08 DIAGNOSIS — G40909 Epilepsy, unspecified, not intractable, without status epilepticus: Secondary | ICD-10-CM | POA: Diagnosis present

## 2017-09-08 DIAGNOSIS — Z91030 Bee allergy status: Secondary | ICD-10-CM | POA: Diagnosis not present

## 2017-09-08 LAB — BASIC METABOLIC PANEL
Anion gap: 5 (ref 5–15)
BUN: 11 mg/dL (ref 6–20)
CHLORIDE: 107 mmol/L (ref 101–111)
CO2: 29 mmol/L (ref 22–32)
CREATININE: 0.48 mg/dL (ref 0.44–1.00)
Calcium: 8.3 mg/dL — ABNORMAL LOW (ref 8.9–10.3)
GFR calc Af Amer: >60 mL/min (ref >60)
GFR calc non Af Amer: >60 mL/min (ref >60)
GLUCOSE: 110 mg/dL — AB (ref 65–99)
Potassium: 4 mmol/L (ref 3.5–5.1)
SODIUM: 141 mmol/L (ref 135–145)

## 2017-09-08 LAB — CBC
HEMATOCRIT: 35.8 % — AB (ref 36.0–46.0)
HEMOGLOBIN: 11.4 g/dL — AB (ref 12.0–15.0)
MCH: 27.3 pg (ref 26.0–34.0)
MCHC: 31.8 g/dL (ref 30.0–36.0)
MCV: 85.9 fL (ref 78.0–100.0)
Platelets: 223 10*3/uL (ref 150–400)
RBC: 4.17 MIL/uL (ref 3.87–5.11)
RDW: 16.6 % — ABNORMAL HIGH (ref 11.5–15.5)
WBC: 9.7 10*3/uL (ref 4.0–10.5)

## 2017-09-08 NOTE — Evaluation (Signed)
Occupational Therapy Evaluation Patient Details Name: Summer [name] MRN: [id number] DOB: 08/28/1951 Today's Date: 09/08/2017    History of Present Illness pt s/p LTKA today (09/07/2017) . H/o of bariatric surgery, depression. Recently moved here 2 years ago to be with family .    Clinical Impression   Pt is s/p TKA resulting in the deficits listed below (see OT Problem List).  Pt will benefit from skilled OT to increase their safety and independence with ADL and functional mobility for ADL to facilitate discharge to venue listed below.        Follow Up Recommendations  No OT follow up          Precautions / Restrictions Precautions Precautions: Knee      Mobility Bed Mobility               General bed mobility comments: pt sitting EOB  Transfers Overall transfer level: Needs assistance Equipment used: Rolling walker (2 wheeled) Transfers: Sit to/from [facility] Sit to Stand: Min assist;+2 safety/equipment Stand pivot transfers: Min assist;+2 safety/equipment       General transfer comment: pt feeling dizzy but BP ok- 139/79        ADL either performed or assessed with clinical judgement   ADL Overall ADL's : Needs assistance/impaired Eating/Feeding: Set up;Sitting   Grooming: Set up;Sitting   Upper Body Bathing: Set up;Sitting   Lower Body Bathing: Moderate assistance;Sit to/from stand;Cueing for safety;Cueing for sequencing   Upper Body Dressing : Set up;Sitting   Lower Body Dressing: Moderate assistance;Sit to/from stand;Cueing for safety;Cueing for sequencing   Toilet Transfer: Minimal assistance;+2 for physical assistance;+2 for safety/equipment   Toileting- Clothing Manipulation and Hygiene: Minimal assistance;Sit to/from stand;Cueing for safety;Cueing for sequencing               Vision Patient Visual Report: No change from baseline              Pertinent Vitals/Pain Pain Score: 3  Pain Location: L knee  area Pain Descriptors / Indicators: Discomfort;Sore Pain Intervention(s): Limited activity within patient's tolerance;Monitored during session        Extremity/Trunk Assessment Upper Extremity Assessment Upper Extremity Assessment: Generalized weakness           Communication Communication Communication: No difficulties   Cognition Arousal/Alertness: Awake/alert Behavior During Therapy: WFL for tasks assessed/performed Overall Cognitive Status: Within Functional Limits for tasks assessed                                                Home Living Family/patient expects to be discharged to:: Private residence Living Arrangements: Spouse/significant other Available Help at Discharge: Family Type of Home: House Home Access: Stairs to enter Technical brewer of Steps: 4 Entrance Stairs-Rails: Right Home Layout: One level               Home Equipment: Environmental consultant - 2 wheels          Prior Functioning/Environment Level of Independence: Independent        Comments: has been going to [employer] (silver sneakers, etc) prepping for knee surgery         OT Problem List: Decreased strength;Decreased knowledge of use of DME or AE      OT Treatment/Interventions: Self-care/ADL training;Patient/family education;DME and/or AE instruction    OT Goals(Current goals can be found in the care plan  section) Acute Rehab OT Goals Patient Stated Goal: I want to continue to be active with my grandkids  OT Goal Formulation: With patient Time For Goal Achievement: 09/15/17  OT Frequency: Min 2X/week              AM-PAC PT "6 Clicks" Daily Activity     Outcome Measure Help from another person eating meals?: None Help from another person taking care of personal grooming?: None Help from another person toileting, which includes using toliet, bedpan, or urinal?: A Little Help from another person bathing (including washing, rinsing, drying)?: A Little Help  from another person to put on and taking off regular upper body clothing?: A Little Help from another person to put on and taking off regular lower body clothing?: A Little 6 Click Score: 20   End of Session Equipment Utilized During Treatment: Rolling walker Nurse Communication: Mobility status  Activity Tolerance: Patient tolerated treatment well Patient left: in chair  OT Visit Diagnosis: Unsteadiness on feet (R26.81);Pain;Muscle weakness (generalized) (M62.81)                Time: 3428-7681 OT Time Calculation (min): 25 min Charges:  OT Evaluation $OT Eval Moderate Complexity: 1 Mod OT Treatments $Self Care/Home Management : 8-22 mins G-Codes:     Kari Baars, OT [phone]  Payton Mccallum D 09/08/2017, 12:17 PM

## 2017-09-08 NOTE — Progress Notes (Signed)
Discharge planning, no HH needs identified. Plan for OP PT already arranged. Has DME. [phone]

## 2017-09-08 NOTE — Progress Notes (Signed)
     Subjective: 1 Day Post-Op Procedure(s) (LRB): LEFT TOTAL KNEE ARTHROPLASTY (Left)   Patient reports pain as mild, pain controlled. No events throughout the night. Looking forward to working with PT.  Ready to be discharged home if she does well with PT.   Objective:   VITALS:   Vitals:   09/08/17 0146 09/08/17 0615  BP: (!) 102/59 (!) 141/69  Pulse: 60 73  Resp: 17 15  Temp: 97.6 F (36.4 C) (!) 97.5 F (36.4 C)  SpO2: 98% 99%    Dorsiflexion/Plantar flexion intact Incision: dressing C/D/I No cellulitis present Compartment soft  LABS Recent Labs    09/08/17 0607  HGB 11.4*  HCT 35.8*  WBC 9.7  PLT 223    Recent Labs    09/08/17 0607  NA 141  K 4.0  BUN 11  CREATININE 0.48  GLUCOSE 110*     Assessment/Plan: 1 Day Post-Op Procedure(s) (LRB): LEFT TOTAL KNEE ARTHROPLASTY (Left) Foley cath d/c'ed Advance diet Up with therapy D/C IV fluids Discharge home Follow up in 2 weeks at [facility]. Follow up with OLIN,Sarim Rothman D in 2 weeks.  Contact information:  [facility] [address] [place] [phone]    Obese (BMI 30-39.9) Estimated body mass index is 37.28 kg/m as calculated from the following:   Height as of this encounter: 5\' 5"  (1.651 m).   Weight as of this encounter: 101.6 kg (224 lb). Patient also counseled that weight may inhibit the healing process Patient counseled that losing weight will help with future health issues        [place]. Naveen Lorusso   PAC  09/08/2017, 9:49 AM

## 2017-09-08 NOTE — Care Management Obs Status (Signed)
[place] NOTIFICATION   Patient Details  Name: Summer [name] MRN: [id number] Date of Birth: 30-Mar-1951   Medicare Observation Status Notification Given:  Yes    Guadalupe Maple, RN 09/08/2017, 11:28 AM

## 2017-09-08 NOTE — Progress Notes (Signed)
Notified [place], [place] that pt was dizzy and was unable to tolerate ambulation except to [facility]. PA stated of patient did not improve after second PT session, we could cancel d/c and he would round on her tomorrow. Will continue to monitor patient.

## 2017-09-08 NOTE — Progress Notes (Signed)
Physical Therapy Treatment Patient Details Name: Summer [name] MRN: [id number] DOB: [birth year] Today's Date: 09/08/2017    History of Present Illness pt s/p LTKA today (09/07/2017) . H/o of bariatric surgery, depression. Recently moved here 2 years ago to be with family .     PT Comments    The patient is tolerating activity very well. Mild dizziness. BP 139/75. Plan DC tomorrow.   Follow Up Recommendations  Outpatient PT     Equipment Recommendations  None recommended by PT    Recommendations for Other Services       Precautions / Restrictions Precautions Precautions: Knee Precaution Comments: low BP    Mobility  Bed Mobility Overal bed mobility: Needs Assistance Bed Mobility: Sit to Supine     Supine to sit: Min assist Sit to supine: Min assist   General bed mobility comments: spouse assisted left leg  Transfers Overall transfer level: Needs assistance Equipment used: Rolling walker (2 wheeled) Transfers: Sit to/from Stand Sit to Stand: Min guard         General transfer comment: cues for hand and left leg  Ambulation/Gait Ambulation/Gait assistance: Min assist Ambulation Distance (Feet): 50 Feet(x 2 then 20') Assistive device: Rolling walker (2 wheeled) Gait Pattern/deviations: Step-to pattern;Step-through pattern     General Gait Details: cues for sequence   Stairs            Wheelchair Mobility    Modified Rankin (Stroke Patients Only)       Balance                                            Cognition Arousal/Alertness: Awake/alert                                            Exercises Total Joint Exercises Ankle Circles/Pumps: AROM;Both;Supine;10 reps Quad Sets: AROM;Left;Supine;10 reps Towel Squeeze: AROM Short Arc Quad: AAROM;10 reps Hip ABduction/ADduction: AROM;10 reps Straight Leg Raises: AAROM;10 reps Goniometric ROM: 0-50 left knee    General Comments        Pertinent  Vitals/Pain Pain Score: 5  Pain Location: L knee area Pain Descriptors / Indicators: Discomfort;Sore Pain Intervention(s): Monitored during session;Premedicated before session;Repositioned;Ice applied    Home Living                      Prior Function            PT Goals (current goals can now be found in the care plan section) Progress towards PT goals: Progressing toward goals    Frequency    7X/week      PT Plan Current plan remains appropriate    Co-evaluation PT/OT/SLP Co-Evaluation/Treatment: Yes Reason for Co-Treatment: For patient/therapist safety PT goals addressed during session: Mobility/safety with mobility OT goals addressed during session: ADL's and self-care      AM-PAC PT "6 Clicks" Daily Activity  Outcome Measure  Difficulty turning over in bed (including adjusting bedclothes, sheets and blankets)?: A Little Difficulty moving from lying on back to sitting on the side of the bed? : A Little Difficulty sitting down on and standing up from a chair with arms (e.g., wheelchair, bedside commode, etc,.)?: A Little Help needed moving to and from a bed to chair (including a wheelchair)?:  A Little Help needed walking in hospital room?: A Little Help needed climbing 3-5 steps with a railing? : A Lot 6 Click Score: 17    End of Session Equipment Utilized During Treatment: Gait belt Activity Tolerance: Patient tolerated treatment well Patient left: in bed;with call bell/phone within reach;with family/visitor present Nurse Communication: Mobility status PT Visit Diagnosis: Other abnormalities of gait and mobility (R26.89);Unsteadiness on feet (R26.81)     Time: 5189-8421 PT Time Calculation (min) (ACUTE ONLY): 42 min  Charges:  $Gait Training: 23-37 mins $  $Self Care/Home Management: July 11, 2023                    G Codes:          [name] 09/08/2017, 4:45 PM

## 2017-09-08 NOTE — Progress Notes (Signed)
Physical Therapy Treatment Patient Details Name: Summer [name] MRN: [id number] DOB: [birth year] Today's Date: 09/08/2017    History of Present Illness pt s/p LTKA today (09/07/2017) . H/o of bariatric surgery, depression. Recently moved here 2 years ago to be with family .     PT Comments    The patient experiencing dizziness and low BP. Tolerated ambulate to BSC x 10'.   Follow Up Recommendations  Outpatient PT     Equipment Recommendations  None recommended by PT    Recommendations for Other Services       Precautions / Restrictions Precautions Precautions: Knee Precaution Comments: low BP    Mobility  Bed Mobility Overal bed mobility: Needs Assistance Bed Mobility: Supine to Sit     Supine to sit: Min assist     General bed mobility comments: extra time  Transfers Overall transfer level: Needs assistance Equipment used: Rolling walker (2 wheeled) Transfers: Sit to/from [facility] Sit to Stand: Min assist Stand pivot transfers: Min assist;+2 safety/equipment       General transfer comment: pt feeling dizzy but BP ok- 139/79, stood x 2 for accommodation  for BP to be checked.   Ambulation/Gait Ambulation/Gait assistance: Min assist Ambulation Distance (Feet): 10 Feet Assistive device: Rolling walker (2 wheeled) Gait Pattern/deviations: Step-to pattern     General Gait Details: started to ambulate into BR, still dizzy complaints so brought th RW up.    Stairs            Wheelchair Mobility    Modified Rankin (Stroke Patients Only)       Balance                                            Cognition Arousal/Alertness: Awake/alert Behavior During Therapy: WFL for tasks assessed/performed Overall Cognitive Status: Within Functional Limits for tasks assessed                                        Exercises Total Joint Exercises Ankle Circles/Pumps: AROM;Both;Supine;10 reps Quad  Sets: AROM;Left;Supine;10 reps Towel Squeeze: AROM Short Arc Quad: AAROM;10 reps Hip ABduction/ADduction: AROM;10 reps Straight Leg Raises: AAROM;10 reps Goniometric ROM: 0-50 left knee    General Comments        Pertinent Vitals/Pain Pain Score: 3  Pain Location: L knee area Pain Descriptors / Indicators: Discomfort;Sore Pain Intervention(s): Limited activity within patient's tolerance;Monitored during session;Premedicated before session    [place] expects to be discharged to:: Private residence Living Arrangements: Spouse/significant other Available Help at Discharge: Family Type of Home: House Home Access: Stairs to enter Entrance Stairs-Rails: Right Home Layout: One level Home Equipment: Environmental consultant - 2 wheels      Prior Function Level of Independence: Independent      Comments: has been going to [employer] (silver sneakers, etc) prepping for knee surgery    PT Goals (current goals can now be found in the care plan section) Acute Rehab PT Goals Patient Stated Goal: I want to continue to be active with my grandkids  Progress towards PT goals: Progressing toward goals    Frequency    7X/week      PT Plan Current plan remains appropriate    Co-evaluation PT/OT/SLP Co-Evaluation/Treatment: Yes Reason for Co-Treatment: For patient/therapist safety PT goals  addressed during session: Mobility/safety with mobility OT goals addressed during session: ADL's and self-care      AM-PAC PT "6 Clicks" Daily Activity  Outcome Measure  Difficulty turning over in bed (including adjusting bedclothes, sheets and blankets)?: Unable Difficulty moving from lying on back to sitting on the side of the bed? : Unable Difficulty sitting down on and standing up from a chair with arms (e.g., wheelchair, bedside commode, etc,.)?: Unable Help needed moving to and from a bed to chair (including a wheelchair)?: A Lot Help needed walking in hospital room?: A Lot Help needed  climbing 3-5 steps with a railing? : Total 6 Click Score: 4    End of Session Equipment Utilized During Treatment: Gait belt Activity Tolerance: Patient tolerated treatment well Patient left: in chair;with call bell/phone within reach Nurse Communication: Mobility status PT Visit Diagnosis: Other abnormalities of gait and mobility (R26.89);Unsteadiness on feet (R26.81)     Time: 3552-1747 PT Time Calculation (min) (ACUTE ONLY): 54 min  Charges:  $Gait Training: 8-22 mins $Therapeutic Exercise: 8-22 mins $Therapeutic Activity: 8-22 mins                    G CodesTresa Endo PT [phone]    [name] 09/08/2017, 1:16 PM

## 2017-09-09 LAB — BASIC METABOLIC PANEL
ANION GAP: 7 (ref 5–15)
BUN: 11 mg/dL (ref 6–20)
CALCIUM: 8 mg/dL — AB (ref 8.9–10.3)
CO2: 28 mmol/L (ref 22–32)
CREATININE: 0.49 mg/dL (ref 0.44–1.00)
Chloride: 100 mmol/L — ABNORMAL LOW (ref 101–111)
GFR calc Af Amer: >60 mL/min (ref >60)
GLUCOSE: 113 mg/dL — AB (ref 65–99)
Potassium: 3.7 mmol/L (ref 3.5–5.1)
Sodium: 135 mmol/L (ref 135–145)

## 2017-09-09 LAB — CBC
HCT: 33.2 % — ABNORMAL LOW (ref 36.0–46.0)
Hemoglobin: 10.8 g/dL — ABNORMAL LOW (ref 12.0–15.0)
MCH: 27.7 pg (ref 26.0–34.0)
MCHC: 32.5 g/dL (ref 30.0–36.0)
MCV: 85.1 fL (ref 78.0–100.0)
PLATELETS: 216 10*3/uL (ref 150–400)
RBC: 3.9 MIL/uL (ref 3.87–5.11)
RDW: 16.8 % — AB (ref 11.5–15.5)
WBC: 8.5 10*3/uL (ref 4.0–10.5)

## 2017-09-09 NOTE — Progress Notes (Signed)
     Subjective: 2 Days Post-Op Procedure(s) (LRB): LEFT TOTAL KNEE ARTHROPLASTY (Left)   Patient reports pain as mild, pain controlled. Working much better with PT than she did with her first session yesterday.  No events throughout the night. Ready to be discharged home.   Objective:   VITALS:   Vitals:   09/08/17 2105 09/09/17 0510  BP: 118/66 120/69  Pulse: 71 75  Resp: 14 14  Temp: 98 F (36.7 C) 98.1 F (36.7 C)  SpO2: 100% 98%    Dorsiflexion/Plantar flexion intact Incision: dressing C/D/I No cellulitis present Compartment soft  LABS Recent Labs    09/08/17 0607 09/09/17 0508  HGB 11.4* 10.8*  HCT 35.8* 33.2*  WBC 9.7 8.5  PLT 223 216    Recent Labs    09/08/17 0607 09/09/17 0508  NA 141 135  K 4.0 3.7  BUN 11 11  CREATININE 0.48 0.49  GLUCOSE 110* 113*     Assessment/Plan: 2 Days Post-Op Procedure(s) (LRB): LEFT TOTAL KNEE ARTHROPLASTY (Left) Up with therapy Discharge home Follow up in 2 weeks at [facility]. Follow up with OLIN,Marcelia Petersen D in 2 weeks.  Contact information:  [facility] [address], [address] [place] Jola Critzer   PAC  09/09/2017, 11:32 AM

## 2017-09-09 NOTE — Progress Notes (Signed)
Patient discharged to home with family. Given all belongings, instructions, prescriptions, equipment. Husb present for all teaching. Escorted to pov via w/c.

## 2017-09-09 NOTE — Progress Notes (Signed)
Physical Therapy Treatment Patient Details Name: Summer [name] MRN: [id number] DOB: [birth year] Today's Date: 09/09/2017    History of Present Illness pt s/p LTKA today (09/07/2017) . H/o of bariatric surgery, depression. Recently moved here 2 years ago to be with family .     PT Comments    Ambulated 60 feet with RW, no c/o's of dizziness. Performed 4 stairs with 2 handrails, vc's for sequencing. Educated patient on using ice. Positioned patient in recliner.   Follow Up Recommendations  Outpatient PT     Equipment Recommendations  None recommended by PT    Recommendations for Other Services       Precautions / Restrictions Precautions Precautions: Knee Restrictions Weight Bearing Restrictions: No    Mobility  Bed Mobility                  Transfers Overall transfer level: Needs assistance Equipment used: Rolling walker (2 wheeled) Transfers: Sit to/from Stand Sit to Stand: Min guard Stand pivot transfers: Min guard       General transfer comment: 25% VC's for hand placement  Ambulation/Gait Ambulation/Gait assistance: Min assist Ambulation Distance (Feet): 60 Feet Assistive device: Rolling walker (2 wheeled) Gait Pattern/deviations: Step-through pattern;Step-to pattern;Decreased stance time - left   Gait velocity interpretation: Below normal speed for age/gender     Stairs Stairs: Yes   Stair Management: Two rails;Step to pattern;Forwards Number of Stairs: 4 General stair comments: 25% VC's for sequence  Wheelchair Mobility    Modified Rankin (Stroke Patients Only)       Balance                                            Cognition Arousal/Alertness: Awake/alert Behavior During Therapy: WFL for tasks assessed/performed Overall Cognitive Status: Within Functional Limits for tasks assessed                                        Exercises Total Joint Exercises Ankle Circles/Pumps:  AROM;Both;Supine;20 reps Quad Sets: AROM;Left;10 reps;Seated Towel Squeeze: AROM;Both;10 reps;Seated Short Arc Quad: AAROM;10 reps;Left;Seated Heel Slides: AAROM;10 reps;Left;Supine Hip ABduction/ADduction: 10 reps;AAROM;Left;Supine Straight Leg Raises: AAROM;10 reps;Left;Supine    General Comments        Pertinent Vitals/Pain Pain Assessment: 0-10 Pain Score: 4  Pain Location: L knee area Pain Descriptors / Indicators: Discomfort;Sore;Tightness Pain Intervention(s): Limited activity within patient's tolerance;Monitored during session;RN gave pain meds during session;Ice applied;Repositioned    Home Living                      Prior Function            PT Goals (current goals can now be found in the care plan section)      Frequency    7X/week      PT Plan Current plan remains appropriate    Co-evaluation              AM-PAC PT "6 Clicks" Daily Activity  Outcome Measure    Difficulty moving from lying on back to sitting on the side of the bed? : A Little Difficulty sitting down on and standing up from a chair with arms (e.g., wheelchair, bedside commode, etc,.)?: A Little Help needed moving to and from a bed to chair (including a  wheelchair)?: A Little Help needed walking in hospital room?: A Little Help needed climbing 3-5 steps with a railing? : A Little 6 Click Score: 15    End of Session Equipment Utilized During Treatment: Gait belt Activity Tolerance: Patient tolerated treatment well Patient left: in chair;with call bell/phone within reach Nurse Communication: Mobility status PT Visit Diagnosis: Other abnormalities of gait and mobility (R26.89);Unsteadiness on feet (R26.81)     Time: 0277-4128 PT Time Calculation (min) (ACUTE ONLY): 40 min  Charges:  $Gait Training: 8-22 mins $Therapeutic Exercise: 8-22 mins $Therapeutic Activity: 8-22 mins                    G Codes:       Almond Lint, SPTA [place] [facility] [place]  PTA [facility]  Acute  Rehab Pager      [phone]

## 2017-09-09 NOTE — Progress Notes (Signed)
Occupational Therapy Treatment Patient Details Name: YOMARA TOOTHMAN MRN: [id number] DOB: November 12, 1950 Today's Date: 09/09/2017    History of present illness pt s/p LTKA today (09/07/2017) . H/o of bariatric surgery, depression. Recently moved here 2 years ago to be with family .    OT comments  Pt much improved  this day!  Follow Up Recommendations  No OT follow up    Equipment Recommendations       Recommendations for Other Services      Precautions / Restrictions Precautions Precautions: Knee Restrictions Weight Bearing Restrictions: No       Mobility Bed Mobility            pt in chair      Transfers Overall transfer level: Needs assistance Equipment used: Rolling walker (2 wheeled) Transfers: Sit to/from Stand Sit to Stand: Min guard Stand pivot transfers: Min guard       General transfer comment: cues for hand and left leg        ADL either performed or assessed with clinical judgement   ADL Overall ADL's : Needs assistance/impaired             Lower Body Bathing: Minimal assistance;Sit to/from stand;Cueing for compensatory techniques;Cueing for sequencing;Cueing for safety   Upper Body Dressing : Set up;Sitting   Lower Body Dressing: Sit to/from stand;Cueing for safety;Cueing for sequencing;Minimal assistance   Toilet Transfer: Min guard;RW;Comfort height toilet   Toileting- Clothing Manipulation and Hygiene: Min guard;Sit to/from stand;Cueing for sequencing   Tub/ Shower Transfer: Copy Details (indicate cue type and reason): verbalized safety Functional mobility during ADLs: Min guard General ADL Comments: husband will A as needed     Vision Patient Visual Report: No change from baseline                Pertinent Vitals/ Pain       Pain Score: 4  Pain Location: L knee area Pain Descriptors / Indicators: Discomfort;Sore Pain Intervention(s): Limited activity within patient's  tolerance;Monitored during session         Frequency  Min 2X/week        Progress Toward Goals  OT Goals(current goals can now be found in the care plan section)  Progress towards OT goals: Progressing toward goals     Plan      Co-evaluation                 AM-PAC PT "6 Clicks" Daily Activity     Outcome Measure   Help from another person eating meals?: None Help from another person taking care of personal grooming?: None Help from another person toileting, which includes using toliet, bedpan, or urinal?: A Little Help from another person bathing (including washing, rinsing, drying)?: A Little Help from another person to put on and taking off regular upper body clothing?: A Little Help from another person to put on and taking off regular lower body clothing?: A Little 6 Click Score: 20    End of Session Equipment Utilized During Treatment: Rolling walker  OT Visit Diagnosis: Unsteadiness on feet (R26.81);Pain;Muscle weakness (generalized) (M62.81)   Activity Tolerance Patient tolerated treatment well   Patient Left in chair   Nurse Communication Mobility status        Time: 1030-1053 OT Time Calculation (min): 23 min  Charges: OT General Charges $OT Visit: 1 Visit OT Treatments $Self Care/Home Management : 8-22 mins  [place], [place]   Betsy Pries 09/09/2017, 11:26 AM

## 2017-09-14 NOTE — Discharge Summary (Signed)
Physician Discharge Summary  Patient ID: Summer [name] MRN: [id number] DOB/AGE: January 20, 1951 66 y.o.  Admit date: 09/07/2017 Discharge date: 09/09/2017   Procedures:  Procedure(s) (LRB): LEFT TOTAL KNEE ARTHROPLASTY (Left)  Attending Physician:  Dr. Paralee Cancel   Admission Diagnoses:   Left knee primary OA / pain  Discharge Diagnoses:  Principal Problem:   S/P left TKA Active Problems:   S/P total knee replacement   Obese  Past Medical History:  Diagnosis Date  . Adenomatous polyp of sigmoid colon 03/18/2017   Overview:  Added automatically from request for surgery [phone]  . Asymptomatic varicose veins 01/11/2017  . Iron deficiency anemia 01/12/2017   Last Assessment & Plan:  Given script for ferrous sulfate and order for repeat cbc with diff and iron studies to be done in 1 month Will also refer pt back to [facility] for repeat colonoscopy and to dietician for nutritional counseling, per pt request  . Lentigines 01/11/2017  . Melanocytic nevi of left lower limb, including hip 01/11/2017  . Melanocytic nevi of left upper limb, including shoulder 01/11/2017  . Melanocytic nevi of right lower limb, including hip 01/11/2017  . Melanocytic nevi of right upper limb, including shoulder 01/11/2017  . Melanocytic nevi of trunk 01/11/2017  . Obesity (BMI 30-39.9) 05/21/2017  . Osteoporosis   . 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 Seizure ([place]) 01/25/2017   LAST SEIZURE- 2004   . Sleep apnea   . Unilateral primary osteoarthritis, left knee 12/11/2016    HPI:    Summer [name], 66 y.o. female, has a history of pain and functional disability in the left knee due to arthritis and has failed non-surgical conservative treatments for greater than 12 weeks to includeNSAID's and/or analgesics, corticosteriod injections and activity modification.  Onset of symptoms was gradual, starting 2-3 years ago with gradually worsening course since that time. The patient noted no past surgery on  the left knee(s).  Patient currently rates pain in the left knee(s) at 10 out of 10 with activity. Patient has worsening of pain with activity and weight bearing, pain that interferes with activities of daily living, pain with passive range of motion, crepitus and joint swelling.  Patient has evidence of periarticular osteophytes and joint space narrowing by imaging studies.  There is no active infection.  Risks, benefits and expectations were discussed with the patient.  Risks including but not limited to the risk of anesthesia, blood clots, nerve damage, blood vessel damage, failure of the prosthesis, infection and up to and including death.  Patient understand the risks, benefits and expectations and wishes to proceed with surgery.   PCP: Briscoe Deutscher, DO   Discharged Condition: good  Hospital Course:  Patient underwent the above stated procedure on 09/07/2017. Patient tolerated the procedure well and brought to the recovery room in good condition and subsequently to the floor.  POD #1 BP: 141/69 ; Pulse: 73 ; Temp: 97.5 F (36.4 C) ; Resp: 15 Patient reports pain as mild, pain controlled. No events throughout the night. Looking forward to working with PT.  Did not work well with PT and determined to stay do to safety concerns of being discharged home at this time. Dorsiflexion/plantar flexion intact, incision: dressing C/D/I, no cellulitis present and compartment soft.   LABS  Basename    HGB     11.4  HCT     35.8   POD #2  BP: 120/69 ; Pulse: 75 ; Temp: 98.1 F (36.7 C) ; Resp: 14  Patient reports pain as mild, pain controlled. Working much better with PT than she did with her first session yesterday.  No events throughout the night. Ready to be discharged home.  Dorsiflexion/plantar flexion intact, incision: dressing C/D/I, no cellulitis present and compartment soft.   LABS  Basename    HGB     10.8  HCT     33.2    Discharge Exam: General appearance: alert, cooperative and no  distress Extremities: Homans sign is negative, no sign of DVT, no edema, redness or tenderness in the calves or thighs and no ulcers, gangrene or trophic changes  Disposition: Home with follow up in 2 weeks   Follow-up Information    Paralee Cancel, MD. Schedule an appointment as soon as possible for a visit in 2 week(s).   Specialty:  Orthopedic Surgery Contact information: [address] [place] [address] [phone]           Discharge Instructions    Call MD / Call 911   Complete by:  As directed    If you experience chest pain or shortness of breath, CALL 911 and be transported to the hospital emergency room.  If you develope a fever above 101 F, pus (white drainage) or increased drainage or redness at the wound, or calf pain, call your surgeon's office.   Change dressing   Complete by:  As directed    Maintain surgical dressing until follow up in the clinic. If the edges start to pull up, may reinforce with tape. If the dressing is no longer working, may remove and cover with gauze and tape, but must keep the area dry and clean.  Call with any questions or concerns.   Constipation Prevention   Complete by:  As directed    Drink plenty of fluids.  Prune juice may be helpful.  You may use a stool softener, such as Colace (over the counter) 100 mg twice a day.  Use MiraLax (over the counter) for constipation as needed.   Diet - low sodium heart healthy   Complete by:  As directed    Discharge instructions   Complete by:  As directed    Maintain surgical dressing until follow up in the clinic. If the edges start to pull up, may reinforce with tape. If the dressing is no longer working, may remove and cover with gauze and tape, but must keep the area dry and clean.  Follow up in 2 weeks at [facility]. Call with any questions or concerns.   Increase activity slowly as tolerated   Complete by:  As directed    Weight bearing as tolerated with assist  device (walker, cane, etc) as directed, use it as long as suggested by your surgeon or therapist, typically at least 4-6 weeks.   TED hose   Complete by:  As directed    Use stockings (TED hose) for 2 weeks on both leg(s).  You may remove them at night for sleeping.      Allergies as of 09/09/2017      Reactions   Bee Venom Swelling      Medication List    STOP taking these medications   ferrous sulfate 325 (65 FE) MG EC tablet Replaced by:  ferrous sulfate 325 (65 FE) MG tablet   ibuprofen 200 MG tablet Commonly known as:  ADVIL,MOTRIN     TAKE these medications   aspirin 81 MG chewable tablet Commonly known as:  ASPIRIN CHILDRENS Chew 1 tablet (  81 mg total) 2 (two) times daily by mouth. Take for 4 weeks.   Calcium-Vitamin D-Vitamin K 030-0923-30 MG-UNT-MCG Tabs Take 1 tablet by mouth 2 (two) times daily. Also includes Vitamin C   denosumab 60 MG/ML Soln injection Commonly known as:  PROLIA Inject 60 mg into the skin every 6 (six) months. Administer in upper arm, thigh, or abdomen   docusate sodium 100 MG capsule Commonly known as:  COLACE Take 1 capsule (100 mg total) 2 (two) times daily by mouth.   ferrous sulfate 325 (65 FE) MG tablet Commonly known as:  FERROUSUL Take 1 tablet (325 mg total) 3 (three) times daily with meals by mouth. Replaces:  ferrous sulfate 325 (65 FE) MG EC tablet   fluticasone 50 MCG/ACT nasal spray Commonly known as:  FLONASE Place 2 sprays into both nostrils daily. What changed:    when to take this  reasons to take this   GLUCOSAMINE CHONDR 1500 COMPLX PO Take 1 capsule by mouth daily.   HYDROcodone-acetaminophen 7.5-325 MG tablet Commonly known as:  NORCO Take 1-2 tablets every 4 (four) hours as needed by mouth for moderate pain or severe pain.   ketotifen 0.025 % ophthalmic solution Commonly known as:  ZADITOR Place 1 drop into both eyes daily.   lamoTRIgine 150 MG tablet Commonly known as:  LAMICTAL Take 150 mg by mouth  2 (two) times daily.   levETIRAcetam 500 MG tablet Commonly known as:  KEPPRA Take 500 mg by mouth 2 (two) times daily.   methocarbamol 500 MG tablet Commonly known as:  ROBAXIN Take 1 tablet (500 mg total) every 6 (six) hours as needed by mouth for muscle spasms.   multivitamin-lutein Caps capsule Take 1 capsule by mouth daily.   OMEGA 3 PO Take 1 capsule by mouth daily.   polyethylene glycol packet Commonly known as:  MIRALAX / GLYCOLAX Take 17 g 2 (two) times daily by mouth.   SM VITAMIN D3 4000 units Caps Generic drug:  Cholecalciferol Take 4,000 Units by mouth daily.   vitamin E 600 UNIT capsule Take 600 Units by mouth daily.            Discharge Care Instructions  (From admission, onward)        Start     Ordered   09/08/17 0000  Change dressing    Comments:  Maintain surgical dressing until follow up in the clinic. If the edges start to pull up, may reinforce with tape. If the dressing is no longer working, may remove and cover with gauze and tape, but must keep the area dry and clean.  Call with any questions or concerns.   09/08/17 0762       Signed: [place]. Glenn Christo   PA-C  09/14/2017, 4:48 PM

## 2017-10-05 ENCOUNTER — Ambulatory Visit: Payer: Self-pay | Admitting: *Deleted

## 2017-10-05 NOTE — Telephone Encounter (Signed)
Please be advised of note below.

## 2017-10-05 NOTE — Telephone Encounter (Signed)
Patient is post surgical 11/13- knee surgery. She has been going to PT and kept her sick grandchild last week. She has picked up cold symptoms and wants to discuss her symptoms and what to do. Patient will do home care and monitor temperature and symptoms. Will call back if she gets worse or feels she needs to be seen. Reason for Disposition . Care advice for mild cough, questions about  Answer Assessment - Initial Assessment Questions 1. ONSET: "When did the nasal discharge start?"      Saturday 2. AMOUNT: "How much discharge is there?"      Good amount of nasal discharge- patient can here her chest wheezing 3. COUGH: "Do you have a cough?" If yes, ask: "Describe the color of your sputum" (clear, white, yellow, green)     Yes- also started Saturday- yellowish 4. RESPIRATORY DISTRESS: "Describe your breathing."      No problems breathing 5. FEVER: "Do you have a fever?" If so, ask: "What is your temperature, how was it measured, and when did it start?"     No- does have hot/cold spells 6. SEVERITY: "Overall, how bad are you feeling right now?" (e.g., doesn't interfere with normal activities, staying home from school/work, staying in bed)      Canceling PT- does not have energy- plus does not risk fall 7. OTHER SYMPTOMS: "Do you have any other symptoms?" (e.g., sore throat, earache, wheezing, vomiting)     Some wheezing with breathing 8. PREGNANCY: "Is there any chance you are pregnant?" "When was your last menstrual period?"     n/a  Protocols used: COMMON COLD-A-AH

## 2017-10-06 NOTE — Telephone Encounter (Signed)
FYI

## 2017-10-26 HISTORY — PX: NM MYOVIEW LTD: HXRAD82

## 2017-11-03 DIAGNOSIS — M25569 Pain in unspecified knee: Secondary | ICD-10-CM | POA: Insufficient documentation

## 2017-11-04 ENCOUNTER — Emergency Department (HOSPITAL_COMMUNITY): Payer: Medicare Other

## 2017-11-04 ENCOUNTER — Ambulatory Visit: Payer: Self-pay | Admitting: *Deleted

## 2017-11-04 ENCOUNTER — Emergency Department (HOSPITAL_COMMUNITY)
Admission: EM | Admit: 2017-11-04 | Discharge: 2017-11-04 | Disposition: A | Discharge status: Home / Self Care | Payer: Medicare Other | Attending: Emergency Medicine | Admitting: Emergency Medicine

## 2017-11-04 ENCOUNTER — Encounter (HOSPITAL_COMMUNITY): Payer: Self-pay | Admitting: Emergency Medicine

## 2017-11-04 DIAGNOSIS — Z96652 Presence of left artificial knee joint: Secondary | ICD-10-CM | POA: Insufficient documentation

## 2017-11-04 DIAGNOSIS — Z79899 Other long term (current) drug therapy: Secondary | ICD-10-CM | POA: Diagnosis not present

## 2017-11-04 DIAGNOSIS — R072 Precordial pain: Secondary | ICD-10-CM | POA: Insufficient documentation

## 2017-11-04 DIAGNOSIS — R079 Chest pain, unspecified: Secondary | ICD-10-CM

## 2017-11-04 LAB — I-STAT TROPONIN, ED
TROPONIN I, POC: 0.01 ng/mL (ref 0.00–0.08)
Troponin i, poc: 0 ng/mL (ref 0.00–0.08)

## 2017-11-04 LAB — BASIC METABOLIC PANEL
ANION GAP: 11 (ref 5–15)
BUN: 14 mg/dL (ref 6–20)
CO2: 26 mmol/L (ref 22–32)
Calcium: 9.1 mg/dL (ref 8.9–10.3)
Chloride: 100 mmol/L — ABNORMAL LOW (ref 101–111)
Creatinine, Ser: 0.59 mg/dL (ref 0.44–1.00)
GFR calc Af Amer: >60 mL/min (ref >60)
GFR calc non Af Amer: >60 mL/min (ref >60)
GLUCOSE: 97 mg/dL (ref 65–99)
Potassium: 4 mmol/L (ref 3.5–5.1)
SODIUM: 137 mmol/L (ref 135–145)

## 2017-11-04 LAB — CBC
HCT: 41.5 % (ref 36.0–46.0)
HEMOGLOBIN: 13.4 g/dL (ref 12.0–15.0)
MCH: 29.1 pg (ref 26.0–34.0)
MCHC: 32.3 g/dL (ref 30.0–36.0)
MCV: 90 fL (ref 78.0–100.0)
Platelets: 253 10*3/uL (ref 150–400)
RBC: 4.61 MIL/uL (ref 3.87–5.11)
RDW: 14.6 % (ref 11.5–15.5)
WBC: 5.2 10*3/uL (ref 4.0–10.5)

## 2017-11-04 LAB — PROTIME-INR
INR: 1.01
Prothrombin Time: 13.2 seconds (ref 11.4–15.2)

## 2017-11-04 LAB — D-DIMER, QUANTITATIVE (NOT AT ARMC): D DIMER QUANT: 0.95 ug{FEU}/mL — AB (ref 0.00–0.50)

## 2017-11-04 MED ORDER — IOPAMIDOL (ISOVUE-370) INJECTION 76%
INTRAVENOUS | Status: AC
  Administered 2017-11-04: 100 mL
  Filled 2017-11-04: qty 100

## 2017-11-04 NOTE — ED Notes (Signed)
Pt ambulatory to lobby with steady gait

## 2017-11-04 NOTE — ED Notes (Signed)
VSS. Pt ambulatory to lobby with steady gait, pt being driven home by husband. All belongings with pt. Verbalized understanding of d/c instructions.

## 2017-11-04 NOTE — ED Triage Notes (Signed)
Pt arrives from home with c/o of substernal chest pain that radiates into the left arm, pain has been intermittent in nature for the last 3 days. Ems gave 324 ASA and 1 sl nitro tablet and on arrival to ED pain is 1/10 non radiating at this time. Pt is alert and ox4 does not appear in any distress.

## 2017-11-04 NOTE — ED Notes (Signed)
ED Provider at bedside. 

## 2017-11-04 NOTE — Consult Note (Signed)
Cardiology Consultation:   Summer [name] ID: TAKODA SIEDLECKI; [id number]; Mar 26, 1951   Admit date: 11/04/2017 Date of Consult: 11/04/2017  Primary Care Provider: Briscoe Deutscher, DO Primary Cardiologist: None  Summer [name] Profile:   Summer [name] is a 67 y.o. female with a hx of osteoarthritis s/p L TKA 08/2017, seizure disorder (suspect 2/2  ETOH withdrawal), ETOH abuse now sober x4 years, and s/p Roux-en-Y bypass surgery in 2008, who is being seen today for the evaluation of chest pain at the request of Dr. Sherry Ruffing.  History of Present Illness:   Summer [name] is a 67 y.o. female with a hx of osteoarthritis s/p L TKA 08/2017, seizure disorder (suspect 2/2  ETOH withdrawal), ETOH abuse now sober x4 years, and s/p Roux-en-Y bypass surgery in 2008, who presents to the ED with chest pain x3 days. Summer [name] was in her usual state of health until 3 days ago when she noticed chest pain while working on her computer. She describes the pain as substernal, with some radiation to the L shoulder, and pleuritic in nature. She notes 4 episodes of chest pain over the past 3 days, each lasting for ~1 hour without any alleviating or aggravating factors. She states she has been working with physical therapy a few days per week and has not experienced any CP with exertion or DOE. She denies being on blood thinners post-op. She denies recent travel. She has had some LE edema since her knee replacement (L>R) but denies pain in her calves.  She states she does not have any cardiac problems. Denies history of HTN, CHF, or arrhythmia. She reports having a stress test to evaluate chest pain >42yr ago which was normal and she denied recurrence of chest pain until a few days ago. She denies orthopnea, PND, and palpitations. She does experience intermittent LE edema post knee replacement L>R.   ED Course: VSS. Labs notable for CBC wnl, electrolytes wnl, Cr 0.59, Troponin neg x1. Ddimer pending. EKG with sinus brady, no STE/D, no  TWI.   Past Medical History:  Diagnosis Date  . Adenomatous polyp of sigmoid colon 03/18/2017   Overview:  Added automatically from request for surgery [phone]  . Asymptomatic varicose veins 01/11/2017  . Iron deficiency anemia 01/12/2017   Last Assessment & Plan:  Given script for ferrous sulfate and order for repeat cbc with diff and iron studies to be done in 1 month Will also refer pt back to CRS for repeat colonoscopy and to dietician for nutritional counseling, per pt request  . Lentigines 01/11/2017  . Melanocytic nevi of left lower limb, including hip 01/11/2017  . Melanocytic nevi of left upper limb, including shoulder 01/11/2017  . Melanocytic nevi of right lower limb, including hip 01/11/2017  . Melanocytic nevi of right upper limb, including shoulder 01/11/2017  . Melanocytic nevi of trunk 01/11/2017  . Obesity (BMI 30-39.9) 05/21/2017  . Osteoporosis   . 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 Seizure ([place]) 01/25/2017   LAST SEIZURE- 2004   . Sleep apnea   . Unilateral primary osteoarthritis, left knee 12/11/2016    Past Surgical History:  Procedure Laterality Date  . BARIATRIC SURGERY    . ECTOPIC PREGNANCY SURGERY    . TOTAL KNEE ARTHROPLASTY Left 09/07/2017   Procedure: LEFT TOTAL KNEE ARTHROPLASTY;  Surgeon: Paralee Cancel, MD;  Location: [facility];  Service: Orthopedics;  Laterality: Left;  Adductor Block     Home Medications:  Prior to Admission medications   Medication Sig Start Date End  Date Taking? Authorizing Provider  Calcium-Vitamin D-Vitamin K 220-2542-70 MG-UNT-MCG TABS Take 1 tablet by mouth 2 (two) times daily. Also includes Vitamin C    [provider]  Cholecalciferol (SM VITAMIN D3) 4000 units CAPS Take 4,000 Units by mouth daily.    [provider]  denosumab (PROLIA) 60 MG/ML SOLN injection Inject 60 mg into the skin every 6 (six) months. Administer in upper arm, thigh, or abdomen    [provider]  docusate sodium (COLACE)  100 MG capsule Take 1 capsule (100 mg total) 2 (two) times daily by mouth. 09/07/17   Danae Orleans, PA-C  ferrous sulfate (FERROUSUL) 325 (65 FE) MG tablet Take 1 tablet (325 mg total) 3 (three) times daily with meals by mouth. 09/07/17   Babish, Rodman Key, PA-C  fluticasone (FLONASE) 50 MCG/ACT nasal spray Place 2 sprays into both nostrils daily. Summer [name] taking differently: Place 2 sprays into both nostrils daily as needed for allergies.  06/28/17   Tasia Catchings, Amy V, PA-C  Glucosamine-Chondroit-Vit C-Mn (GLUCOSAMINE CHONDR 1500 COMPLX PO) Take 1 capsule by mouth daily.    [provider]  HYDROcodone-acetaminophen (NORCO) 7.5-325 MG tablet Take 1-2 tablets every 4 (four) hours as needed by mouth for moderate pain or severe pain. 09/07/17   Danae Orleans, PA-C  ketotifen (ZADITOR) 0.025 % ophthalmic solution Place 1 drop into both eyes daily.    [provider]  lamoTRIgine (LAMICTAL) 150 MG tablet Take 150 mg by mouth 2 (two) times daily.    [provider]  levETIRAcetam (KEPPRA) 500 MG tablet Take 500 mg by mouth 2 (two) times daily.    [provider]  methocarbamol (ROBAXIN) 500 MG tablet Take 1 tablet (500 mg total) every 6 (six) hours as needed by mouth for muscle spasms. 09/07/17   Danae Orleans, PA-C  multivitamin-lutein (OCUVITE-LUTEIN) CAPS capsule Take 1 capsule by mouth daily.    [provider]  Omega-3 Fatty Acids (OMEGA 3 PO) Take 1 capsule by mouth daily.    [provider]  polyethylene glycol (MIRALAX / GLYCOLAX) packet Take 17 g 2 (two) times daily by mouth. 09/07/17   Danae Orleans, PA-C  vitamin E 600 UNIT capsule Take 600 Units by mouth daily.    [provider]    Inpatient Medications: Scheduled Meds:  Continuous Infusions:  PRN Meds:   Allergies:    Allergies  Allergen Reactions  . Bee Venom Swelling    Social History:   Social History   Socioeconomic History  . Marital status: Married    Spouse  name: Not on file  . Number of children: Not on file  . Years of education: Not on file  . Highest education level: Not on file  Social Needs  . Financial resource strain: Not on file  . Food insecurity - worry: Not on file  . Food insecurity - inability: Not on file  . Transportation needs - medical: Not on file  . Transportation needs - non-medical: Not on file  Occupational History  . Not on file  Tobacco Use  . Smoking status: Never Smoker  . Smokeless tobacco: Never Used  Substance and Sexual Activity  . Alcohol use: No  . Drug use: No  . Sexual activity: No  Other Topics Concern  . Not on file  Social History Narrative  . Not on file    Family History:   Mother and sister with MVP. Mother still alive in her 20s without cardiac issues. Father died at an early  age from colon cancer  ROS:  Please see the history of present illness.   All other ROS reviewed and negative.     Physical Exam/Data:   Vitals:   11/04/17 1500 11/04/17 1523 11/04/17 1530 11/04/17 1545  BP:  132/73 131/68 118/69  Pulse: (!) 57 73 68 61  Resp: 15 19 15 11   Temp:      TempSrc:      SpO2: 97% 100% 98% 96%  Weight:      Height:       No intake or output data in the 24 hours ending 11/04/17 1618 Filed Weights   11/04/17 1326  Weight: 220 lb (99.8 kg)   Body mass index is 35.51 kg/m.  General:  Well nourished, well developed, in no acute distress HEENT: sclera anicteric  Neck: no JVD Vascular: No carotid bruits; distal pulses 2+ bilaterally Cardiac:  normal S1, S2; RRR; no murmur, gallops, or rubs Lungs:  clear to auscultation bilaterally, no wheezing, rhonchi or rales  Abd: NABS, soft, nontender, no hepatomegaly Ext: 1+ edema of the LLE, trace edema of the RLE Musculoskeletal:  No deformities, BUE and BLE strength normal and equal Skin: warm and dry  Neuro:  CNs 2-12 intact, no focal abnormalities noted Psych:  Normal affect   EKG:  The EKG was personally reviewed and  demonstrates: EKG with sinus brady, no STE/D, no TWI.  Telemetry:  Telemetry was personally reviewed and demonstrates:  NSR/sinus brady  Relevant CV Studies: None  Laboratory Data:  Chemistry Recent Labs  Lab 11/04/17 1311  NA 137  K 4.0  CL 100*  CO2 26  GLUCOSE 97  BUN 14  CREATININE 0.59  CALCIUM 9.1  GFRNONAA >60  GFRAA >60  ANIONGAP 11    No results for input(s): PROT, ALBUMIN, AST, ALT, ALKPHOS, BILITOT in the last 168 hours. Hematology Recent Labs  Lab 11/04/17 1311  WBC 5.2  RBC 4.61  HGB 13.4  HCT 41.5  MCV 90.0  MCH 29.1  MCHC 32.3  RDW 14.6  PLT 253   Cardiac EnzymesNo results for input(s): TROPONINI in the last 168 hours.  Recent Labs  Lab 11/04/17 1403  TROPIPOC 0.00    BNPNo results for input(s): BNP, PROBNP in the last 168 hours.  DDimer No results for input(s): DDIMER in the last 168 hours.  Radiology/Studies:  No results found.  Assessment and Plan:   1. Chest pain: atypical chest pain which occurs at rest and is pleuritic in nature. She is 2 months post total knee replacement and more sedentary since her surgery. Would favor PE over cardiac origin - Troponin negative x1, can continue to trend x3 - EKG with sinus brady, no STE/D, no TWI. No evidence of right heart strain - Ddimer elevated. Please obtain CTA Chest - Can obtain LE dopplers - If troponin's remain negative, no further inpatient ischemic work up needed. Can pursue outpatient stress   2. S/p L total knee replacement: Summer [name] reports not being on anticoagulation post procedure.  - Pain control per primary team  3. Seizure disorder: - Continue home medications    For questions or updates, please contact [place] Please consult [email or web address] for contact info under Cardiology/STEMI.   Signed, Abigail Butts, PA-C  11/04/2017 4:18 PM [phone] As above, Summer [name] seen and examined.  Briefly she is a 67 year old female with past medical history of seizure  disorder felt secondary to alcohol, alcohol abuse but none in the past 4 years, prior gastric bypass, recent  total knee replacement in November for evaluation of chest pain.  Summer [name] typically does not have dyspnea on exertion or exertional chest pain.  Over the past several days she has had substernal chest pain that increases with inspiration.  No radiation.  No associated nausea, dyspnea or diaphoresis.  Pain will last 1 hour and resolved with aspirin.  She had more prolonged pain today and came to the emergency room and this was relieved with nitroglycerin.  Cardiology asked to evaluate.  She denies fevers, cough or hemoptysis.  D-dimer is 0.95.  Troponin is 0.0.  Hemoglobin 13.4.  Electrocardiogram shows sinus rhythm but no ST changes.  1 chest pain-symptoms are atypical for ischemia.  Will cycle enzymes.  If negative will likely arrange outpatient functional study.  She did have recent knee replacement in November.  D-dimer mildly elevated.  Schedule CTA to exclude pulmonary embolus.  2 history of seizure disorder-management per primary care.  3 status post knee replacement-follow-up orthopedics.  Kirk Ruths, MD

## 2017-11-04 NOTE — Progress Notes (Signed)
Called for this patient with chest pain.  Cardiology has seen the patient and their note indicates that this is likely atypical in nature.  They recommended a CTA to r/o PE and this was done and negative.  Per their note, it is appropriate to cycles enzymes and arrange to outpatient cardiology f/u for functional study.  After discussion with Dr. Lacinda Axon, he will repeat the patient's troponin now (it was negative at 2pm).  If negative, he is comfortable discharging the patient with outpatient cardiology f/u as suggested.  He will consult TR[facility] back if the second troponin is elevated.  Carlyon Shadow, M.D.

## 2017-11-04 NOTE — ED Notes (Signed)
Pt ambulatory to restroom with steady gait.

## 2017-11-04 NOTE — Telephone Encounter (Signed)
See note

## 2017-11-04 NOTE — ED Provider Notes (Signed)
Second troponin negative.  No chest pain at this time.  Discussed results with patient.  She will be discharged and follow-up with cardiology as an outpatient.  She understands to return for any worsening condition.   Nat Christen, MD 11/04/17 2150

## 2017-11-04 NOTE — Telephone Encounter (Signed)
Patient went to ED

## 2017-11-04 NOTE — ED Notes (Signed)
Dinner tray ordered. Pt can eat per Dr Lacinda Axon

## 2017-11-04 NOTE — ED Notes (Signed)
Pt updated to delay and POC. Pt continues to inquire about her home medications. Dr. Lacinda Axon has been made aware, awaiting dispo. Pt notified

## 2017-11-04 NOTE — Discharge Instructions (Signed)
Chemical associated with a heart attack called troponin was negative x2 different occasions.  You can always return to the emergency department if your symptoms worsen.  Otherwise, call cardiology tomorrow for an appointment soon.  Phone number given.  Remind them that you were in the emergency department on Thursday evening.

## 2017-11-04 NOTE — ED Notes (Signed)
Patient transported to CT 

## 2017-11-04 NOTE — ED Notes (Signed)
Per main lab, will add on D dimer to existing blue top

## 2017-11-04 NOTE — ED Notes (Addendum)
Main lab called regarding D dimer results. Per main lab, machine is stalled and lab work has not yet resulted. Will pull and send a second blue top. EDP aware.

## 2017-11-04 NOTE — Telephone Encounter (Signed)
Pt reports intermittent chest pain x 3 days; Mid chest, now radiates to left shoulder and arm. No SOB,nausea or diaphoresis. Instructed to go to ED, husband will drive. Reason for Disposition . Pain also present in shoulder(s) or arm(s) or jaw  (Exception: pain is clearly made worse by movement)  Answer Assessment - Initial Assessment Questions 1. LOCATION: "Where does it hurt?"       Mid chest 2. RADIATION: "Does the pain go anywhere else?" (e.g., into neck, jaw, arms, back)     Left shoulder and now left arm. 3. ONSET: "When did the chest pain begin?" (Minutes, hours or days)      3 days ago 4. PATTERN "Does the pain come and go, or has it been constant since it started?"  "Does it get worse with exertion?"      *No Answer* 5. DURATION: "How long does it last" (e.g., seconds, minutes, hours)     *No Answer* 6. SEVERITY: "How bad is the pain?"  (e.g., Scale 1-10; mild, moderate, or severe)    - MILD (1-3): doesn't interfere with normal activities     - MODERATE (4-7): interferes with normal activities or awakens from sleep    - SEVERE (8-10): excruciating pain, unable to do any normal activities       *No Answer* 7. CARDIAC RISK FACTORS: "Do you have any history of heart problems or risk factors for heart disease?" (e.g., prior heart attack, angina; high blood pressure, diabetes, being overweight, high cholesterol, smoking, or strong family history of heart disease)     *No Answer* 8. PULMONARY RISK FACTORS: "Do you have any history of lung disease?"  (e.g., blood clots in lung, asthma, emphysema, birth control pills)     *No Answer* 9. CAUSE: "What do you think is causing the chest pain?"     *No Answer* 10. OTHER SYMPTOMS: "Do you have any other symptoms?" (e.g., dizziness, nausea, vomiting, sweating, fever, difficulty breathing, cough)       no  Protocols used: CHEST PAIN-A-AH

## 2017-11-04 NOTE — ED Provider Notes (Signed)
[facility] EMERGENCY DEPARTMENT Provider Note   CSN: [id number] Arrival date & time: 11/04/17  1308     History   Chief Complaint Chief Complaint  Patient presents with  . Chest Pain    HPI Summer [name] is a 67 y.o. female.  The history is provided by medical records, the patient and the spouse. No language interpreter was used.  Chest Pain   This is a new problem. The current episode started more than 2 days ago (4 days ago). The problem occurs daily. The problem has not changed since onset.The pain is associated with breathing. The pain is present in the substernal region. The pain is at a severity of 6/10. The pain is moderate. The quality of the pain is described as pleuritic, pressure-like and heavy. The pain radiates to the left shoulder and left arm. Duration of episode(s) is 1 hour. The symptoms are aggravated by deep breathing. Associated symptoms include leg pain (chronic L leg pain since knee surgery in November). Pertinent negatives include no abdominal pain, no back pain, no cough, no diaphoresis, no dizziness, no exertional chest pressure, no fever, no headaches, no hemoptysis, no irregular heartbeat, no lower extremity edema, no malaise/fatigue, no nausea, no near-syncope, no palpitations, no shortness of breath, no sputum production, no syncope, no vomiting and no weakness. She has tried nothing for the symptoms. The treatment provided no relief.    Past Medical History:  Diagnosis Date  . Adenomatous polyp of sigmoid colon 03/18/2017   Overview:  Added automatically from request for surgery [phone]  . Asymptomatic varicose veins 01/11/2017  . Iron deficiency anemia 01/12/2017   Last Assessment & Plan:  Given script for ferrous sulfate and order for repeat cbc with diff and iron studies to be done in 1 month Will also refer pt back to [facility] for repeat colonoscopy and to dietician for nutritional counseling, per pt request  . Lentigines 01/11/2017  .  Melanocytic nevi of left lower limb, including hip 01/11/2017  . Melanocytic nevi of left upper limb, including shoulder 01/11/2017  . Melanocytic nevi of right lower limb, including hip 01/11/2017  . Melanocytic nevi of right upper limb, including shoulder 01/11/2017  . Melanocytic nevi of trunk 01/11/2017  . Obesity (BMI 30-39.9) 05/21/2017  . Osteoporosis   . 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 Seizure ([place]) 01/25/2017   LAST SEIZURE- 2004   . Sleep apnea   . Unilateral primary osteoarthritis, left knee 12/11/2016    Patient Active Problem List   Diagnosis Date Noted  . Obese 09/08/2017  . S/P left TKA 09/07/2017  . S/P total knee replacement 09/07/2017  . Obesity (BMI 30-39.9) 05/21/2017  . Anemia 05/21/2017  . Adenomatous polyp of sigmoid colon 03/18/2017  . Seizure ([place]) 01/25/2017  . Iron deficiency anemia 01/12/2017  . Neck pain 01/11/2017  . Osteoporosis 01/11/2017  . Lentigines 01/11/2017  . Unilateral primary osteoarthritis, left knee 12/11/2016  . Primary osteoarthritis, right shoulder 12/11/2016    Past Surgical History:  Procedure Laterality Date  . BARIATRIC SURGERY    . ECTOPIC PREGNANCY SURGERY    . TOTAL KNEE ARTHROPLASTY Left 09/07/2017   Procedure: LEFT TOTAL KNEE ARTHROPLASTY;  Surgeon: Paralee Cancel, MD;  Location: [facility];  Service: Orthopedics;  Laterality: Left;  Adductor Block    OB History    No data available       Home Medications    Prior to Admission medications   Medication Sig Start Date End Date  Taking? Authorizing Provider  Calcium-Vitamin D-Vitamin K 834-1962-22 MG-UNT-MCG TABS Take 1 tablet by mouth 2 (two) times daily. Also includes Vitamin C    [provider]  Cholecalciferol (SM VITAMIN D3) 4000 units CAPS Take 4,000 Units by mouth daily.    [provider]  denosumab (PROLIA) 60 MG/ML SOLN injection Inject 60 mg into the skin every 6 (six) months. Administer in upper arm, thigh, or abdomen     [provider]  docusate sodium (COLACE) 100 MG capsule Take 1 capsule (100 mg total) 2 (two) times daily by mouth. 09/07/17   Danae Orleans, PA-C  ferrous sulfate (FERROUSUL) 325 (65 FE) MG tablet Take 1 tablet (325 mg total) 3 (three) times daily with meals by mouth. 09/07/17   Babish, Rodman Key, PA-C  fluticasone (FLONASE) 50 MCG/ACT nasal spray Place 2 sprays into both nostrils daily. Patient taking differently: Place 2 sprays into both nostrils daily as needed for allergies.  06/28/17   Tasia Catchings, Amy V, PA-C  Glucosamine-Chondroit-Vit C-Mn (GLUCOSAMINE CHONDR 1500 COMPLX PO) Take 1 capsule by mouth daily.    [provider]  HYDROcodone-acetaminophen (NORCO) 7.5-325 MG tablet Take 1-2 tablets every 4 (four) hours as needed by mouth for moderate pain or severe pain. 09/07/17   Danae Orleans, PA-C  ketotifen (ZADITOR) 0.025 % ophthalmic solution Place 1 drop into both eyes daily.    [provider]  lamoTRIgine (LAMICTAL) 150 MG tablet Take 150 mg by mouth 2 (two) times daily.    [provider]  levETIRAcetam (KEPPRA) 500 MG tablet Take 500 mg by mouth 2 (two) times daily.    [provider]  methocarbamol (ROBAXIN) 500 MG tablet Take 1 tablet (500 mg total) every 6 (six) hours as needed by mouth for muscle spasms. 09/07/17   Danae Orleans, PA-C  multivitamin-lutein (OCUVITE-LUTEIN) CAPS capsule Take 1 capsule by mouth daily.    [provider]  Omega-3 Fatty Acids (OMEGA 3 PO) Take 1 capsule by mouth daily.    [provider]  polyethylene glycol (MIRALAX / GLYCOLAX) packet Take 17 g 2 (two) times daily by mouth. 09/07/17   Danae Orleans, PA-C  vitamin E 600 UNIT capsule Take 600 Units by mouth daily.    [provider]    Family History No family history on file.  Social History Social History   Tobacco Use  . Smoking status: Never Smoker  . Smokeless tobacco: Never Used  Substance Use Topics  . Alcohol use: No    . Drug use: No     Allergies   Bee venom   Review of Systems Review of Systems  Constitutional: Negative for appetite change, diaphoresis, fatigue, fever and malaise/fatigue.  HENT: Negative for congestion.   Respiratory: Negative for cough, hemoptysis, sputum production, chest tightness, shortness of breath, wheezing and stridor.   Cardiovascular: Positive for chest pain. Negative for palpitations, leg swelling, syncope and near-syncope.  Gastrointestinal: Negative for abdominal pain, blood in stool, diarrhea, nausea and vomiting.  Genitourinary: Negative for flank pain and frequency.  Musculoskeletal: Negative for back pain, neck pain and neck stiffness.  Neurological: Negative for dizziness, weakness, light-headedness and headaches.  Psychiatric/Behavioral: Negative for agitation.  All other systems reviewed and are negative.    Physical Exam Updated Vital Signs BP 127/74 (BP Location: Right Arm)   Pulse 66   Temp 97.7 F (36.5 C) (Oral)   Resp 16   Ht 5\' 6"  (1.676 m)   Wt 99.8 kg (220 lb)   SpO2 100%  BMI 35.51 kg/m   Physical Exam  Constitutional: She is oriented to person, place, and time. She appears well-developed and well-nourished. No distress.  HENT:  Head: Normocephalic.  Mouth/Throat: Oropharynx is clear and moist. No oropharyngeal exudate.  Eyes: Conjunctivae and EOM are normal. Pupils are equal, round, and reactive to light.  Neck: Normal range of motion.  Cardiovascular: Normal rate. Exam reveals no gallop.  No murmur heard. Pulmonary/Chest: Effort normal. No stridor. No tachypnea. No respiratory distress. She has no wheezes. She has no rhonchi. She has no rales. She exhibits tenderness (mild).  Abdominal: Soft. She exhibits no distension. There is no tenderness.  Musculoskeletal: She exhibits no edema or tenderness.  Neurological: She is alert and oriented to person, place, and time. No sensory deficit. She exhibits normal muscle tone.  Skin:  Capillary refill takes less than 2 seconds. No rash noted. She is not diaphoretic. No erythema.  Psychiatric: She has a normal mood and affect.  Nursing note and vitals reviewed.    ED Treatments / Results  Labs (all labs ordered are listed, but only abnormal results are displayed) Labs Reviewed  BASIC METABOLIC PANEL - Abnormal; Notable for the following components:      Result Value   Chloride 100 (*)    All other components within normal limits  D-DIMER, QUANTITATIVE (NOT AT [facility]) - Abnormal; Notable for the following components:   D-Dimer, Quant 0.95 (*)    All other components within normal limits  CBC  PROTIME-INR  I-STAT TROPONIN, ED  I-STAT TROPONIN, ED    EKG  EKG Interpretation  Date/Time:  Thursday November 04 2017 13:23:12 EST Ventricular Rate:  59 PR Interval:    QRS Duration: 85 QT Interval:  455 QTC Calculation: 451 R Axis:   40 Text Interpretation:  Sinus rhythm no prior ECG for comparison.  No STEMI Confirmed by Antony Blackbird [phone]) on 11/04/2017 2:06:51 PM Also confirmed by Antony Blackbird [phone]), editor Philomena Doheny [phone])  on 11/04/2017 3:15:16 PM       Radiology Ct Angio Chest Pe W And/or Wo Contrast  Result Date: 11/04/2017 CLINICAL DATA:  Substernal chest pain. EXAM: CT ANGIOGRAPHY CHEST WITH CONTRAST TECHNIQUE: Multidetector CT imaging of the chest was performed using the standard protocol during bolus administration of intravenous contrast. Multiplanar CT image reconstructions and MIPs were obtained to evaluate the vascular anatomy. CONTRAST:  154mL ISOVUE-370 IOPAMIDOL (ISOVUE-370) INJECTION 76% COMPARISON:  None. FINDINGS: Cardiovascular: Satisfactory opacification of the pulmonary arteries to the segmental level. No evidence of pulmonary embolism. Normal heart size. No pericardial effusion. Calcific atherosclerotic disease of the coronary arteries and less so aorta. Mediastinum/Nodes: No enlarged mediastinal, hilar, or axillary lymph nodes.  Thyroid gland, trachea, and esophagus demonstrate no significant findings. Lungs/Pleura: Lungs are clear. No pleural effusion or pneumothorax. Upper Abdomen: No acute abnormality.  Post bariatric surgery. Musculoskeletal: No chest wall abnormality. No acute or significant osseous findings. Review of the MIP images confirms the above findings. IMPRESSION: No evidence of pulmonary embolus. Calcific atherosclerotic disease of the coronary arteries. Aortic Atherosclerosis (ICD10-I70.0). Electronically Signed   By: Fidela Salisbury M.D.   On: 11/04/2017 18:08    Procedures Procedures (including critical care time)  Medications Ordered in ED Medications  iopamidol (ISOVUE-370) 76 % injection (100 mLs  Contrast Given 11/04/17 1751)     Initial Impression / Assessment and Plan / ED Course  I have reviewed the triage vital signs and the nursing notes.  Pertinent labs & imaging results that were available during  my care of the patient were reviewed by me and considered in my medical decision making (see chart for details).     Summer [name] is a 67 y.o. female with a past medical history significant for anemia, osteoporosis, seizures, and obesity who presents with chest pain.  Patient reports that for the last 3 days she has had intermittent episodes of chest discomfort which she describes as a central chest pressure.  She reports that it radiated to her left shoulder and into the upper part of her left arm.  She reports that it occurred when she was sitting at her computer and was not exertional.  She reports that it was slightly pleuritic at one point.  She describes it as up to a 6 out of 10 in severity.  She reports that this morning she took aspirin and nitroglycerin and the pain completely relieved.  She is currently chest pain-free.  She reports she had no nausea, vomiting, or diaphoresis with the pain.  She describes no shortness of breath, lightheadedness, or palpitations.  She reports that  in November she had a left knee replacement and has had some pain in that leg since but has no history of DVT or PE.  She reports that she has been on her leg and doing rehab without any difficulty.  She reports no radiation of pain to her back, and has no other significant complaints.  She denies any urinary symptoms or GI symptoms.  On exam, lungs are clear.  Chest was slightly tender to palpation but she reports this pain was very different and did not reproduce her discomfort of pressure.  She had no abdominal tenderness.  Legs were nonedematous and nontender.  Patient has symmetric pulses in upper and lower extremities.  No focal neurologic deficits on my assessment.  Initial EKG showed no evidence of ST elevation MI.  Heart score calculated as a 5.  Given patient's pleuritic pain and recent knee surgery, patient will have a d-dimer ordered however, I suspect patient will require admission for high risk chest pain regardless of her testing today.  3:49 PM Initial troponin is negative.  BMP overall reassuring.  CBC shows no anemia and no leukocytosis.  Patient is awaiting d-dimer to determine if she needs a PE study before admission.           Final Clinical Impressions(s) / ED Diagnoses   Final diagnoses:  Precordial pain     Clinical Impression: 1. Precordial pain     Disposition: Awaiting cards eval and recommendations      Tegeler, Gwenyth Allegra, MD 11/04/17 2121

## 2017-11-04 NOTE — ED Notes (Signed)
D-dimer sent. Pt and family updated to delay.

## 2017-11-09 ENCOUNTER — Other Ambulatory Visit: Payer: Self-pay

## 2017-11-09 ENCOUNTER — Ambulatory Visit: Payer: Medicare Other | Admitting: Cardiology

## 2017-11-09 ENCOUNTER — Telehealth (HOSPITAL_COMMUNITY): Payer: Self-pay

## 2017-11-09 DIAGNOSIS — R072 Precordial pain: Secondary | ICD-10-CM

## 2017-11-09 NOTE — Telephone Encounter (Signed)
Encounter complete. 

## 2017-11-10 ENCOUNTER — Ambulatory Visit: Payer: Self-pay | Admitting: Unknown Physician Specialty

## 2017-11-10 ENCOUNTER — Ambulatory Visit (HOSPITAL_COMMUNITY)
Admission: RE | Admit: 2017-11-10 | Discharge: 2017-11-10 | Disposition: A | Discharge status: Home / Self Care | Payer: Medicare Other | Source: Ambulatory Visit | Attending: Cardiology | Admitting: Cardiology

## 2017-11-10 DIAGNOSIS — R072 Precordial pain: Secondary | ICD-10-CM | POA: Insufficient documentation

## 2017-11-10 DIAGNOSIS — R5383 Other fatigue: Secondary | ICD-10-CM | POA: Diagnosis not present

## 2017-11-10 DIAGNOSIS — I2510 Atherosclerotic heart disease of native coronary artery without angina pectoris: Secondary | ICD-10-CM | POA: Insufficient documentation

## 2017-11-10 DIAGNOSIS — R9439 Abnormal result of other cardiovascular function study: Secondary | ICD-10-CM | POA: Insufficient documentation

## 2017-11-10 MED ORDER — REGADENOSON 0.4 MG/5ML IV SOLN
0.4000 mg | Freq: Once | INTRAVENOUS | Status: AC
  Administered 2017-11-10: 0.4 mg via INTRAVENOUS

## 2017-11-10 MED ORDER — TECHNETIUM TC 99M TETROFOSMIN IV KIT
29.4000 | PACK | Freq: Once | INTRAVENOUS | Status: AC | PRN
  Administered 2017-11-10: 29.4 via INTRAVENOUS
  Filled 2017-11-10: qty 30

## 2017-11-11 ENCOUNTER — Ambulatory Visit (HOSPITAL_COMMUNITY)
Admission: RE | Admit: 2017-11-11 | Discharge: 2017-11-11 | Disposition: A | Discharge status: Home / Self Care | Payer: Medicare Other | Source: Ambulatory Visit | Attending: Cardiology | Admitting: Cardiology

## 2017-11-11 LAB — MYOCARDIAL PERFUSION IMAGING
CHL CUP NUCLEAR SRS: 0
CHL CUP RESTING HR STRESS: 72 {beats}/min
LV dias vol: 98 mL (ref 46–106)
LVSYSVOL: 41 mL
NUC STRESS TID: 0.94
Peak HR: 96 {beats}/min
SDS: 0
SSS: 0

## 2017-11-11 MED ORDER — TECHNETIUM TC 99M TETROFOSMIN IV KIT
28.5000 | PACK | Freq: Once | INTRAVENOUS | Status: AC | PRN
  Administered 2017-11-11: 28.5 via INTRAVENOUS

## 2017-11-15 ENCOUNTER — Telehealth: Payer: Self-pay | Admitting: *Deleted

## 2017-11-15 NOTE — Telephone Encounter (Signed)
Patient stated that [facility] was requesting cardiac clearance for the patient to have PT. Her last day of PT is Wednesday. She has not been seen here at the [facility] clinic before and stated that she was told previously that she did not need to be seen so her appointment had been cancelled.  Message has been left with Jerene Pitch at [facility] to call back for further recommendation.

## 2017-11-15 NOTE — Telephone Encounter (Signed)
Call placed to the patient. Message left for her to call back. She came in as a walk in requesting physical therapy clearance from [facility]. She has not been seen as a patient at [facility]. New patient appointment was cancelled. She will need an appointment in order to possibly have clearance.

## 2017-11-19 ENCOUNTER — Ambulatory Visit: Payer: Medicare Other | Admitting: Family Medicine

## 2017-11-19 ENCOUNTER — Encounter: Payer: Self-pay | Admitting: Family Medicine

## 2017-11-19 ENCOUNTER — Telehealth: Payer: Self-pay

## 2017-11-19 VITALS — BP 130/80 | HR 87 | Temp 98.2°F | Wt 229.6 lb

## 2017-11-19 DIAGNOSIS — E782 Mixed hyperlipidemia: Secondary | ICD-10-CM

## 2017-11-19 DIAGNOSIS — E669 Obesity, unspecified: Secondary | ICD-10-CM

## 2017-11-19 DIAGNOSIS — R569 Unspecified convulsions: Secondary | ICD-10-CM | POA: Diagnosis not present

## 2017-11-19 DIAGNOSIS — M79671 Pain in right foot: Secondary | ICD-10-CM | POA: Diagnosis not present

## 2017-11-19 DIAGNOSIS — M79672 Pain in left foot: Secondary | ICD-10-CM

## 2017-11-19 MED ORDER — LEVETIRACETAM 500 MG PO TABS: 500.0000 mg | ORAL_TABLET | Freq: Two times a day (BID) | ORAL | 1 refills | Status: DC

## 2017-11-19 MED ORDER — LAMOTRIGINE 150 MG PO TABS: 150.0000 mg | ORAL_TABLET | Freq: Two times a day (BID) | ORAL | 1 refills | Status: DC

## 2017-11-19 NOTE — Telephone Encounter (Signed)
Encounter closed. Call never returned.

## 2017-11-19 NOTE — Telephone Encounter (Signed)
Per patient care everywhere [place]. She would be well over due for it now. Can you let me know how to get this taken care of.

## 2017-11-21 NOTE — Progress Notes (Signed)
Summer [name] is a 67 y.o. female is here for follow up.  History of Present Illness:   HPI: See Assessment and Plan section for Problem Based Charting of issues discussed today.   Health Maintenance Due  Topic Date Due  . Hepatitis C Screening  02/09/1951  . MAMMOGRAM  09/21/2001  . COLONOSCOPY  09/21/2001   Depression screen PHQ 2/9 05/20/2017  Decreased Interest 0  Down, Depressed, Hopeless 0  PHQ - 2 Score 0   PMHx, SurgHx, SocialHx, FamHx, Medications, and Allergies were reviewed in the Visit Navigator and updated as appropriate.   Patient Active Problem List   Diagnosis Date Noted  . S/P left TKA 09/07/2017  . S/P total knee replacement 09/07/2017  . Obesity (BMI 30-39.9) 05/21/2017  . Anemia 05/21/2017  . Adenomatous polyp of sigmoid colon 03/18/2017  . Seizure ([place]) 01/25/2017  . Iron deficiency anemia 01/12/2017  . Neck pain 01/11/2017  . Osteoporosis 01/11/2017  . Lentigines 01/11/2017  . Unilateral primary osteoarthritis, left knee 12/11/2016  . Primary osteoarthritis, right shoulder 12/11/2016   Social History   Tobacco Use  . Smoking status: Never Smoker  . Smokeless tobacco: Never Used  Substance Use Topics  . Alcohol use: No  . Drug use: No   Current Medications and Allergies:   Current Outpatient Medications:  .  aspirin EC 81 MG tablet, Take 81 mg by mouth daily., Disp: , Rfl:  .  Calcium-Vitamin D-Vitamin K [id number] MG-UNT-MCG TABS, Take 1 tablet by mouth 2 (two) times daily. Also includes Vitamin C, Disp: , Rfl:  .  Cholecalciferol (SM VITAMIN D3) 4000 units CAPS, Take 4,000 Units by mouth daily., Disp: , Rfl:  .  denosumab (PROLIA) 60 MG/ML SOLN injection, Inject 60 mg into the skin every 6 (six) months. Administer in upper arm, thigh, or abdomen, Disp: , Rfl:  .  ferrous sulfate (FERROUSUL) 325 (65 FE) MG tablet, Take 1 tablet (325 mg total) 3 (three) times daily with meals by mouth. (Patient taking differently: Take 325 mg by mouth  daily with breakfast. ), Disp: , Rfl: 3 .  Glucosamine-Chondroit-Vit C-Mn (GLUCOSAMINE CHONDR 1500 COMPLX PO), Take 1 capsule by mouth daily., Disp: , Rfl:  .  HYDROcodone-acetaminophen (NORCO) 7.5-325 MG tablet, Take 1-2 tablets every 4 (four) hours as needed by mouth for moderate pain or severe pain., Disp: 60 tablet, Rfl: 0 .  lamoTRIgine (LAMICTAL) 150 MG tablet, Take 1 tablet (150 mg total) by mouth 2 (two) times daily., Disp: 180 tablet, Rfl: 1 .  levETIRAcetam (KEPPRA) 500 MG tablet, Take 1 tablet (500 mg total) by mouth 2 (two) times daily., Disp: 180 tablet, Rfl: 1 .  methocarbamol (ROBAXIN) 500 MG tablet, Take 1 tablet (500 mg total) every 6 (six) hours as needed by mouth for muscle spasms., Disp: 40 tablet, Rfl: 0 .  multivitamin-lutein (OCUVITE-LUTEIN) CAPS capsule, Take 1 capsule by mouth daily., Disp: , Rfl:  .  Omega-3 Fatty Acids (OMEGA 3 PO), Take 1 capsule by mouth daily., Disp: , Rfl:  .  vitamin E 600 UNIT capsule, Take 600 Units by mouth daily., Disp: , Rfl:    Allergies  Allergen Reactions  . Bee Venom Swelling   Review of Systems   Pertinent items are noted in the HPI. Otherwise, ROS is negative.  Vitals:   Vitals:   11/19/17 1350  BP: 130/80  Pulse: 87  Temp: 98.2 F (36.8 C)  TempSrc: Oral  SpO2: 98%  Weight: 229 lb 9.6 oz (104.1 kg)  Body mass index is 37.06 kg/m.   Physical Exam:   Physical Exam  Constitutional: She is oriented to person, place, and time. She appears well-developed and well-nourished. No distress.  HENT:  Head: Normocephalic and atraumatic.  Right Ear: External ear normal.  Left Ear: External ear normal.  Nose: Nose normal.  Mouth/Throat: Oropharynx is clear and moist.  Eyes: Conjunctivae and EOM are normal. Pupils are equal, round, and reactive to light.  Neck: Normal range of motion. Neck supple. No thyromegaly present.  Cardiovascular: Normal rate, regular rhythm, normal heart sounds and intact distal pulses.    Pulmonary/Chest: Effort normal and breath sounds normal.  Abdominal: Soft. Bowel sounds are normal.  Musculoskeletal: Normal range of motion.  Lymphadenopathy:    She has no cervical adenopathy.  Neurological: She is alert and oriented to person, place, and time.  Skin: Skin is warm and dry. Capillary refill takes less than 2 seconds.  Psychiatric: She has a normal mood and affect. Her behavior is normal.  Nursing note and vitals reviewed.   Assessment and Plan:   Summer [name] was seen today for referral, results and tremors.  Diagnoses and all orders for this visit:  Seizure ([place]) Comments: Stable. Needs medications refills. Okay for referral to Neurology to continue to follow.  Orders: -     lamoTRIgine (LAMICTAL) 150 MG tablet; Take 1 tablet (150 mg total) by mouth 2 (two) times daily. -     levETIRAcetam (KEPPRA) 500 MG tablet; Take 1 tablet (500 mg total) by mouth 2 (two) times daily. -     Ambulatory referral to Neurology  Pain in both feet Comments: Bilateral foot pain, L > R. Mechanical likely. Interested in orthostics. Wants referral to Podiatry. Orders: -     Ambulatory referral to Podiatry  Mixed hyperlipidemia Comments: Due for FLP. Future orders placed.  Orders: -     Lipid panel; Future  Obesity (BMI 30-39.9) Comments: The patient is asked to make an attempt to improve diet and exercise patterns to aid in medical management of this problem.    . Reviewed expectations re: course of current medical issues. . Discussed self-management of symptoms. . Outlined signs and symptoms indicating need for more acute intervention. . Patient verbalized understanding and all questions were answered. Marland Kitchen Health Maintenance issues including appropriate healthy diet, exercise, and smoking avoidance were discussed with patient. . See orders for this visit as documented in the electronic medical record. . Patient received an After Visit Summary.  Briscoe Deutscher, DO [place],  [place] 11/21/2017  Future Appointments  Date Time Provider [place]  08/01/2018  9:00 AM Williemae Area, RN [facility]   Records requested if needed. Time spent with the patient: 25 minutes, of which >50% was spent in obtaining information about her symptoms, reviewing her previous labs, evaluations, and treatments, counseling her about her condition (please see the discussed topics above), and developing a plan to further investigate it; she had a number of questions which I addressed.

## 2017-11-22 NOTE — Telephone Encounter (Signed)
Patient is scheduled 11/26/17

## 2017-11-25 ENCOUNTER — Other Ambulatory Visit: Payer: Medicare Other

## 2017-11-26 ENCOUNTER — Other Ambulatory Visit (INDEPENDENT_AMBULATORY_CARE_PROVIDER_SITE_OTHER): Payer: Medicare Other

## 2017-11-26 ENCOUNTER — Ambulatory Visit (INDEPENDENT_AMBULATORY_CARE_PROVIDER_SITE_OTHER): Payer: Medicare Other | Admitting: *Deleted

## 2017-11-26 ENCOUNTER — Other Ambulatory Visit: Payer: Medicare Other

## 2017-11-26 DIAGNOSIS — E782 Mixed hyperlipidemia: Secondary | ICD-10-CM

## 2017-11-26 DIAGNOSIS — M810 Age-related osteoporosis without current pathological fracture: Secondary | ICD-10-CM

## 2017-11-26 LAB — LIPID PANEL
Cholesterol: 182 mg/dL (ref 0–200)
HDL: 66.2 mg/dL (ref >39.00)
LDL Cholesterol: 101 mg/dL — ABNORMAL HIGH (ref 0–99)
NonHDL: 115.63
Total CHOL/HDL Ratio: 3
Triglycerides: 74 mg/dL (ref 0.0–149.0)
VLDL: 14.8 mg/dL (ref 0.0–40.0)

## 2017-11-26 MED ORDER — DENOSUMAB 60 MG/ML ~~LOC~~ SOLN
60.0000 mg | Freq: Once | SUBCUTANEOUS | Status: AC
  Administered 2017-11-26: 60 mg via SUBCUTANEOUS

## 2017-11-26 NOTE — Progress Notes (Signed)
Pt presented to office for Prolia injection. Pt tolerated well.

## 2017-11-26 NOTE — Progress Notes (Signed)
I have reviewed the patient's encounter and agree with the documentation.  Algis Greenhouse. Jerline Pain, MD 11/26/2017 11:30 AM

## 2017-12-01 ENCOUNTER — Encounter: Payer: Self-pay | Admitting: Podiatry

## 2017-12-01 ENCOUNTER — Other Ambulatory Visit: Payer: Self-pay | Admitting: Podiatry

## 2017-12-01 ENCOUNTER — Ambulatory Visit: Payer: Medicare Other

## 2017-12-01 ENCOUNTER — Ambulatory Visit: Payer: Medicare Other | Admitting: Podiatry

## 2017-12-01 ENCOUNTER — Ambulatory Visit (INDEPENDENT_AMBULATORY_CARE_PROVIDER_SITE_OTHER): Payer: Medicare Other

## 2017-12-01 DIAGNOSIS — M79672 Pain in left foot: Secondary | ICD-10-CM

## 2017-12-01 DIAGNOSIS — M216X9 Other acquired deformities of unspecified foot: Secondary | ICD-10-CM

## 2017-12-01 DIAGNOSIS — M2011 Hallux valgus (acquired), right foot: Secondary | ICD-10-CM

## 2017-12-01 DIAGNOSIS — M2040 Other hammer toe(s) (acquired), unspecified foot: Secondary | ICD-10-CM

## 2017-12-01 DIAGNOSIS — M2010 Hallux valgus (acquired), unspecified foot: Secondary | ICD-10-CM

## 2017-12-01 DIAGNOSIS — M21619 Bunion of unspecified foot: Secondary | ICD-10-CM

## 2017-12-01 NOTE — Patient Instructions (Signed)
Bunion A bunion is a bump on the base of the big toe that forms when the bones of the big toe joint move out of position. Bunions may be small at first, but they often get larger over time. The can make walking painful. What are the causes? A bunion may be caused by:  Wearing narrow or pointed shoes that force the big toe to press against the other toes.  Abnormal foot development that causes the foot to roll inward (pronate).  Changes in the foot that are caused by certain diseases, such as rheumatoid arthritis and polio.  A foot injury.  What increases the risk? The following factors may make you more likely to develop this condition:  Wearing shoes that squeeze the toes together.  Having certain diseases, such as: ? Rheumatoid arthritis. ? Polio. ? Cerebral palsy.  Having family members who have bunions.  Being born with a foot deformity, such as flat feet or low arches.  Doing activities that put a lot of pressure on the feet, such as ballet dancing.  What are the signs or symptoms? The main symptom of a bunion is a noticeable bump on the big toe. Other symptoms may include:  Pain.  Swelling around the big toe.  Redness and inflammation.  Thick or hardened skin on the big toe or between the toes.  Stiffness or loss of motion in the big toe.  Trouble with walking.  How is this diagnosed? A bunion may be diagnosed based on your symptoms, medical history, and activities. You may have tests, such as:  X-rays. These allow your health care provider to check the position of the bones in your foot and look for damage to your joint. They also help your health care provider to determine the severity of your bunion and the best way to treat it.  Joint aspiration. In this test, a sample of fluid is removed from the toe joint. This test, which may be done if you are in a lot of pain, helps to rule out diseases that cause painful swelling of the joints, such as  arthritis.  How is this treated? There is no cure for a bunion, but treatment can help to prevent a bunion from getting worse. Treatment depends on the severity of your symptoms. Your health care provider may recommend:  Wearing shoes that have a wide toe box.  Using bunion pads to cushion the affected area.  Taping your toes together to keep them in a normal position.  Placing a device inside your shoe (orthotics) to help reduce pressure on your toe joint.  Taking medicine to ease pain, inflammation, and swelling.  Applying heat or ice to the affected area.  Doing stretching exercises.  Surgery to remove scar tissue and move the toes back into their normal position. This treatment is rare.  Follow these instructions at home:  Support your toe joint with proper footwear, shoe padding, or taping as told by your health care provider.  Take over-the-counter and prescription medicines only as told by your health care provider.  If directed, apply ice to the injured area: ? Put ice in a plastic bag. ? Place a towel between your skin and the bag. ? Leave the ice on for 20 minutes, 2-3 times per day.  If directed, apply heat to the affected area before you exercise. Use the heat source that your health care provider recommends, such as a moist heat pack or a heating pad. ? Place a towel between your   skin and the heat source. ? Leave the heat on for 20-30 minutes. ? Remove the heat if your skin turns bright red. This is especially important if you are unable to feel pain, heat, or cold. You may have a greater risk of getting burned.  Do exercises as told by your health care provider.  Keep all follow-up visits as told by your health care provider. Contact a health care provider if:  Your symptoms get worse.  Your symptoms do not improve in 2 weeks. Get help right away if:  You have severe pain and trouble with walking. This information is not intended to replace advice given  to you by your health care provider. Make sure you discuss any questions you have with your health care provider. Document Released: 10/12/2005 Document Revised: 03/19/2016 Document Reviewed: 05/12/2015 Elsevier Interactive Patient Education  2018 Elsevier Inc.   Hammer Toe Hammer toe is a change in the shape (a deformity) of your second, third, or fourth toe. The deformity causes the middle joint of your toe to stay bent. This causes pain, especially when you are wearing shoes. Hammer toe starts gradually. At first, the toe can be straightened. Gradually over time, the deformity becomes stiff and permanent. Early treatments to keep the toe straight may relieve pain. As the deformity becomes stiff and permanent, surgery may be needed to straighten the toe. What are the causes? Hammer toe is caused by abnormal bending of the toe joint that is closest to your foot. It happens gradually over time. This pulls on the muscles and connections (tendons) of the toe joint, making them weak and stiff. It is often related to wearing shoes that are too short or narrow and do not let your toes straighten. What increases the risk? You may be at greater risk for hammer toe if you:  Are female.  Are older.  Wear shoes that are too small.  Wear high-heeled shoes that pinch your toes.  Are a ballet dancer.  Have a second toe that is longer than your big toe (first toe).  Injure your foot or toe.  Have arthritis.  Have a family history of hammer toe.  Have a nerve or muscle disorder.  What are the signs or symptoms? The main symptoms of this condition are pain and deformity of the toe. The pain is worse when wearing shoes, walking, or running. Other symptoms may include:  Corns or calluses over the bent part of the toe or between the toes.  Redness and a burning feeling on the toe.  An open sore that forms on the top of the toe.  Not being able to straighten the toe.  How is this  diagnosed? This condition is diagnosed based on your symptoms and a physical exam. During the exam, your health care provider will try to straighten your toe to see how stiff the deformity is. You may also have tests, such as:  A blood test to check for rheumatoid arthritis.  An X-ray to show how severe the deformity is.  How is this treated? Treatment for this condition will depend on how stiff the deformity is. Surgery is often needed. However, sometimes a hammer toe can be straightened without surgery. Treatments that do not involve surgery include:  Taping the toe into a straightened position.  Using pads and cushions to protect the toe (orthotics).  Wearing shoes that provide enough room for the toes.  Doing toe-stretching exercises at home.  Taking an NSAID to reduce pain and   swelling.  If these treatments do not help or the toe cannot be straightened, surgery is the next option. The most common surgeries used to straighten a hammer toe include:  Arthroplasty. In this procedure, part of the joint is removed, and that allows the toe to straighten.  Fusion. In this procedure, cartilage between the two bones of the joint is taken out and the bones are fused together into one longer bone.  Implantation. In this procedure, part of the bone is removed and replaced with an implant to let the toe move again.  Flexor tendon transfer. In this procedure, the tendons that curl the toes down (flexor tendons) are repositioned.  Follow these instructions at home:  Take over-the-counter and prescription medicines only as told by your health care provider.  Do toe straightening and stretching exercises as told by your health care provider.  Keep all follow-up visits as told by your health care provider. This is important. How is this prevented?  Wear shoes that give your toes enough room and do not cause pain.  Do not wear high-heeled shoes. Contact a health care provider if:  Your  pain gets worse.  Your toe becomes red or swollen.  You develop an open sore on your toe. This information is not intended to replace advice given to you by your health care provider. Make sure you discuss any questions you have with your health care provider. Document Released: 10/09/2000 Document Revised: 05/01/2016 Document Reviewed: 02/05/2016 Elsevier Interactive Patient Education  2018 Elsevier Inc.   

## 2017-12-01 NOTE — Progress Notes (Signed)
   Subjective:    Patient ID: Summer [name], female    DOB: 1951-06-12, 67 y.o.   MRN: [id number]  HPI    Review of Systems     Objective:   Physical Exam        Assessment & Plan:

## 2017-12-01 NOTE — Progress Notes (Signed)
Subjective:   Patient ID: Summer [name], female   DOB: 67 y.o.   MRN: [id number]   HPI Patient presents with pain in the left forefoot with structural bunion deformity noted bilateral left over right and digital deformities.  Has had a knee replacement in the last 3 months left and is walking different and was referred by orthopedic surgeon patient does not smoke and does like to be active   Review of Systems  All other systems reviewed and are negative.       Objective:  Physical Exam  Constitutional: She appears well-developed and well-nourished.  Cardiovascular: Intact distal pulses.  Pulmonary/Chest: Effort normal.  Musculoskeletal: Normal range of motion.  Neurological: She is alert.  Skin: Skin is warm.  Nursing note and vitals reviewed.   Significant structural bunion deformity left over right rigid contracture lesser digits left over right with plantar keratotic lesion second metatarsal that is painful when pressed left.  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  I do recommend a soft orthotic with offloading the second metatarsal left along with reduction of pressure on the metatarsal phalangeal joints bilateral.  Patient is scheduled with ped orthotist to go over this and to make a orthotic which will help to reduce her stress.  X-ray indicates structural bunion deformity with digital deformity with moderate lifting of the second and third toes left

## 2017-12-03 ENCOUNTER — Ambulatory Visit (INDEPENDENT_AMBULATORY_CARE_PROVIDER_SITE_OTHER): Payer: Medicare Other | Admitting: Orthotics

## 2017-12-03 DIAGNOSIS — M21619 Bunion of unspecified foot: Secondary | ICD-10-CM

## 2017-12-03 DIAGNOSIS — M2040 Other hammer toe(s) (acquired), unspecified foot: Secondary | ICD-10-CM

## 2017-12-03 NOTE — Progress Notes (Signed)
Patient was seen today for f/o to address 2nd Left capsulitis ([place]); Richy to fab soft device w/ met pad and 2nd offload.  $300 signed ABN

## 2017-12-10 NOTE — Progress Notes (Signed)
[place] Report   Patient Details  Name: Summer [name] MRN: [id number] Date of Birth: 08-19-1951 Age: 67 y.o. PCP: Briscoe Deutscher, DO  Vitals:   12/10/17 1310  BP: (!) 140/96  Pulse: 68  Resp: 18  SpO2: 98%  Weight: 232 lb 6.4 oz (105.4 kg)     [facility] Eval - 12/10/17 1300      Referral    Referring Provider  Dr. [name]    Reason for referral  Obesitity/Overweight;Inactivity;Orthopedic    Program Start Date  12/10/17      Measurement   Waist Circumference  42 inches    Hip Circumference  53.5 inches    Body fat  47.1 percent      Information for Trainer   Goals  "strength, lose weigh: 50lbs"    Current Exercise  N/A    Orthopedic Concerns  "LT knee replaced 09/07/2017, arthritis neck bilat shoulders"    Pertinent Medical History  "epilepsy, last seizure 4-5 yesrs ago"    Restrictions/Precautions  Fall risk      Timed Up and Go (TUGS)   Timed Up and Go  Moderate risk 10-12 seconds 10.71secs   10.71secs     Mobility and Daily Activities   I find it easy to walk up or down two or more flights of stairs.  1    I have no trouble taking out the trash.  2    I do housework such as vacuuming and dusting on my own without difficulty.  4    I can easily lift a gallon of milk (8lbs).  4    I can easily walk a mile.  1    I have no trouble reaching into high cupboards or reaching down to pick up something from the floor.  4    I do not have trouble doing out-door work such as Armed forces logistics/support/administrative officer, raking leaves, or gardening.  1      Mobility and Daily Activities   I feel younger than my age.  4    I feel independent.  4    I feel energetic.  3    I live an active life.   2    I feel strong.  4    I feel healthy.  4    I feel active as other people my age.  1      How fit and strong are you.   Fit and Strong Total Score  39      Past Medical History:  Diagnosis Date  . Adenomatous polyp of sigmoid colon 03/18/2017   Overview:  Added  automatically from request for surgery [phone]  . Asymptomatic varicose veins 01/11/2017  . Iron deficiency anemia 01/12/2017   Last Assessment & Plan:  Given script for ferrous sulfate and order for repeat cbc with diff and iron studies to be done in 1 month Will also refer pt back to [facility] for repeat colonoscopy and to dietician for nutritional counseling, per pt request  . Lentigines 01/11/2017  . Melanocytic nevi of left lower limb, including hip 01/11/2017  . Melanocytic nevi of left upper limb, including shoulder 01/11/2017  . Melanocytic nevi of right lower limb, including hip 01/11/2017  . Melanocytic nevi of right upper limb, including shoulder 01/11/2017  . Melanocytic nevi of trunk 01/11/2017  . Obesity (BMI 30-39.9) 05/21/2017  . Osteoporosis   . 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 Seizure ([place]) 01/25/2017   LAST SEIZURE- 2004   .  Sleep apnea   . Unilateral primary osteoarthritis, left knee 12/11/2016   Past Surgical History:  Procedure Laterality Date  . BARIATRIC SURGERY    . ECTOPIC PREGNANCY SURGERY    . 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  She and her husband will be doing the Wed/Friday 11-noon small group training but will not be in town to start next week.   [name] 12/10/2017, 1:37 PM

## 2017-12-24 DIAGNOSIS — M79673 Pain in unspecified foot: Secondary | ICD-10-CM

## 2017-12-27 NOTE — Progress Notes (Signed)
[facility] PREP Weekly Session   Patient Details  Name: ONNIKA SIEBEL MRN: [id number] Date of Birth: 07-Sep-1951 Age: 67 y.o. PCP: Briscoe Deutscher, DO  Vitals:   12/27/17 0924  Weight: 231 lb (104.8 kg)    [facility] Weekly seesion - 12/27/17 0900      Weekly Session   Topic Discussed  Health habits    Classes attended to date  1 1st class back after TKR.    1st class back after TKR.      Fun things you did this week:"Spent time w/mother" Things you are grateful for:"husband" Nutrition celebrations:"cutting back on night eating" Barriers:"night eating"  [name] 12/27/2017, 9:26 AM

## 2017-12-31 NOTE — Progress Notes (Signed)
[facility] PREP Weekly Session   Patient Details  Name: JULIANA BOLING MRN: [id number] Date of Birth: Jun 05, 1951 Age: 67 y.o. PCP: Briscoe Deutscher, DO  Vitals:   12/27/17 1343  Weight: 230 lb (104.3 kg)    [facility] Weekly seesion - 12/27/17 1344      Weekly Session   Topic Discussed  Restaurant Eating    Minutes exercised this week  105 minutes 40cardio/20strength/8flexibility   40cardio/20strength/31flexibility   Classes attended to date  2      Fun things you did since last meeting:"weekend on grandson duty:)" Things you are grateful for:"resting my feet Monday" Nutrition celebrations:"removing food (peanuts) from bedroom" Barriers:"eating w/o thinking:("   [name] 12/31/2017, 1:44 PM

## 2018-01-05 ENCOUNTER — Other Ambulatory Visit: Payer: Medicare Other | Admitting: Orthotics

## 2018-01-07 NOTE — Progress Notes (Signed)
[facility] PREP Weekly Session   Patient Details  Name: Summer [name] MRN: [id number] Date of Birth: Jun 19, 1951 Age: 67 y.o. PCP: Briscoe Deutscher, DO  Vitals:   01/07/18 1258  Weight: 229 lb (103.9 kg)    [facility] Weekly seesion - 01/07/18 1200      Weekly Session   Topic Discussed  Stress management and problem solving    Minutes exercised this week  95 minutes 25cardio/25strength/35flexibility   25cardio/25strength/29flexibility   Classes attended to date  3      Fun things you did since last meeting:"[employer], Brunch w/friends" Things you are grateful for:"Warm temperatures" Nutrition celebrations:"eat salad before entree" Barriers:"night-time eating"  [name] 01/07/2018, 12:58 PM

## 2018-01-14 ENCOUNTER — Encounter: Payer: Self-pay | Admitting: Neurology

## 2018-01-17 ENCOUNTER — Encounter: Payer: Self-pay | Admitting: Podiatry

## 2018-01-17 NOTE — Progress Notes (Addendum)
[facility] PREP Weekly Session   Patient Details  Name: Summer [name] MRN: [id number] Date of Birth: Apr 22, 1951 Age: 66 y.o. PCP: Briscoe Deutscher, DO  Vitals:   01/14/18 0921  Weight: 232 lb (105.2 kg)    [facility] Weekly seesion - 01/17/18 0900      Weekly Session   Topic Discussed  --    Minutes exercised this week  --    Classes attended to date  --      Fun things you did since last meeting:"Messed up corn beef & cabbage:) REALLY:(" Things you are grateful for:"Visit from grandson" Nutrition celebrations:"Eating awful corned beef and cabbage.  Each day low calories" Barriers:"still night eating"    [name] 01/17/2018, 9:29 AM

## 2018-01-18 ENCOUNTER — Ambulatory Visit: Payer: Medicare Other | Admitting: Neurology

## 2018-01-18 ENCOUNTER — Encounter: Payer: Self-pay | Admitting: Neurology

## 2018-01-18 ENCOUNTER — Telehealth: Payer: Self-pay | Admitting: Neurology

## 2018-01-18 VITALS — BP 132/86 | HR 72 | Ht 66.0 in | Wt 234.0 lb

## 2018-01-18 DIAGNOSIS — R569 Unspecified convulsions: Secondary | ICD-10-CM

## 2018-01-18 DIAGNOSIS — G40209 Localization-related (focal) (partial) symptomatic epilepsy and epileptic syndromes with complex partial seizures, not intractable, without status epilepticus: Secondary | ICD-10-CM | POA: Diagnosis not present

## 2018-01-18 DIAGNOSIS — R209 Unspecified disturbances of skin sensation: Secondary | ICD-10-CM

## 2018-01-18 DIAGNOSIS — R202 Paresthesia of skin: Secondary | ICD-10-CM

## 2018-01-18 NOTE — Telephone Encounter (Signed)
[employer] Medicare auth: [place] Ref # [id number] order sent to GI they will reach out to the patient to schedule.

## 2018-01-18 NOTE — Progress Notes (Signed)
PATIENT: Summer [name] DOB: [birth year]  Chief Complaint  Patient presents with  . Seizures    She is here to discuss having two seizures in 2015. No further events. She is currently taking both Keppra and Lamictal.    . other concerns    Reports the left and right side of face feel differently.  She has noticed the sensitivity is different, along with right-eye dryness.    Marland Kitchen PCP    Briscoe Deutscher, DO     HISTORICAL  Summer [name] is a 68 year old female, seen in refer by her primary care doctor Briscoe Deutscher, for evaluation of seizure, facial paresthesia, initial evaluation was on January 18, 2018.  She had a history of osteoporosis, is taking Prolia 60 mg every 6 months  History of complex partial seizure with secondary generalization  I reviewed previous neurology evaluation from [facility] clinic by Dr.Jehi  First seizure was in the labor room at age 33 after delivery, no warning signs,  Around 2015, she started binge drink for 3-4 years, she had recurrent seizure in February 2015, patient was amnestic of the event, husband got a call from her primary care physician the patient had a seizure during her routine appointment, with tongue biting, was admitted to [facility],    CT, MRI of the brain with and without contrast showed mild supratentorium small vessel disease, EEG showed no acute abnormality, no antiepileptic medication was given, record mentions that was thought to be due to alcohol withdrawal, patient stated that she was drinking heavily, hard liquor on a daily basis, but did stop drinking for weeks however it to the event in February 2015.  Second event July 2015, was at a work picnic, then woke up on the hospital, was witnessed by a physician there that she had a seizure, loss of consciousness with tonic-clonic activity of the extremities that lasted 5 minutes, followed by 30-60 minutes post ictal confusion, Keppra was started at 750 twice a day,  later was increased to 1000 mg twice a day in May 2015, patient complains of feeling sleepy,  Third event was in [place], she was witnessed by her friend that she has transient episode of confusion,  FL, was witneess by frined, she has seizure, no attentive, dong well, tolerating well with medications,   Her medication was later changed to lamotrigine 150 mg twice a day, Keppra 500 mg twice daily, stayed on that regimen since, she is tolerating the medication well, there was no recurrent seizures.   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  Today she also complains of more than 3 years history of constant right facial paresthesia, burning hypersensitivity, mild asymmetry of the face, she noticed more wrinkles in her left lower face while woke up in the morning time.  Laboratory evaluation in January 2019 showed mild elevated LDL 101, negative troponin, INR 1.01, d-dimer was mildly elevated at 0.95, negative troponin, normal BMP, CBC    REVIEW OF SYSTEMS: Full 14 system review of systems performed and notable only for gait abnormality, left knee pain  ALLERGIES: Allergies  Allergen Reactions  . Bee Venom Swelling    HOME MEDICATIONS: Current Outpatient Medications  Medication Sig Dispense Refill  . Acetaminophen (TYLENOL PO) Take by mouth as needed.    . Calcium-Vitamin D-Vitamin K 270 344 8970-90 MG-UNT-MCG TABS Take 1 tablet by mouth  2 (two) times daily. Also includes Vitamin C    . Cholecalciferol (SM VITAMIN D3) 4000 units CAPS Take 4,000 Units by mouth daily.    Marland Kitchen denosumab (PROLIA) 60 MG/ML SOLN injection Inject 60 mg into the skin every 6 (six) months. Administer in upper arm, thigh, or abdomen    . ferrous sulfate (FERROUSUL) 325 (65 FE) MG tablet Take 1 tablet (325 mg total) 3  (three) times daily with meals by mouth. (Patient taking differently: Take 325 mg by mouth daily with breakfast. )  3  . Glucosamine-Chondroit-Vit C-Mn (GLUCOSAMINE CHONDR 1500 COMPLX PO) Take 1 capsule by mouth daily.    Marland Kitchen lamoTRIgine (LAMICTAL) 150 MG tablet Take 1 tablet (150 mg total) by mouth 2 (two) times daily. 180 tablet 1  . levETIRAcetam (KEPPRA) 500 MG tablet Take 1 tablet (500 mg total) by mouth 2 (two) times daily. 180 tablet 1  . multivitamin-lutein (OCUVITE-LUTEIN) CAPS capsule Take 1 capsule by mouth daily.    . Omega-3 Fatty Acids (OMEGA 3 PO) Take 1 capsule by mouth daily.    . vitamin E 600 UNIT capsule Take 600 Units by mouth daily.     No current facility-administered medications for this visit.     PAST MEDICAL HISTORY: Past Medical History:  Diagnosis Date  . Adenomatous polyp of sigmoid colon 03/18/2017   Overview:  Added automatically from request for surgery [id number]  . Alcoholism ([place])    01/18/18 - no use in 5 years  . Asymptomatic varicose veins 01/11/2017  . Iron deficiency anemia 01/12/2017   Last Assessment & Plan:  Given script for ferrous sulfate and order for repeat cbc with diff and iron studies to be done in 1 month Will also refer pt back to [facility] for repeat colonoscopy and to dietician for nutritional counseling, per pt request  . Lentigines 01/11/2017  . Melanocytic nevi of left lower limb, including hip 01/11/2017  . Melanocytic nevi of left upper limb, including shoulder 01/11/2017  . Melanocytic nevi of right lower limb, including hip 01/11/2017  . Melanocytic nevi of right upper limb, including shoulder 01/11/2017  . Melanocytic nevi of trunk 01/11/2017  . Obesity (BMI 30-39.9) 05/21/2017  . Osteoporosis   . Plantar fasciitis   . 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 Seizure ([place]) 01/25/2017   LAST SEIZURE- 2004   . Sleep apnea   . Unilateral primary osteoarthritis, left knee 12/11/2016    PAST SURGICAL HISTORY: Past Surgical History:    Procedure Laterality Date  . BARIATRIC SURGERY    . ECTOPIC PREGNANCY SURGERY    . TOTAL KNEE ARTHROPLASTY Left 09/07/2017   Procedure: LEFT TOTAL KNEE ARTHROPLASTY;  Surgeon: Paralee Cancel, MD;  Location: [facility];  Service: Orthopedics;  Laterality: Left;  Adductor Block    FAMILY HISTORY: Family History  Problem Relation Age of Onset  . Mitral valve prolapse Mother   . Osteoporosis Mother   . Memory loss Mother   . Colon cancer Father   . Alcoholism Father     SOCIAL HISTORY:  Social History   Socioeconomic History  . Marital status: Married    Spouse name: Not on file  . Number of children: 1  . Years of education: college  . Highest education level: Bachelor's degree (e.g., BA, AB, BS)  Occupational History  . Occupation: Retired  Scientific laboratory technician  . Financial resource strain: Not on file  . Food insecurity:    Worry: Not on file    Inability: Not on file  .  Transportation needs:    Medical: Not on file    Non-medical: Not on file  Tobacco Use  . Smoking status: Never Smoker  . Smokeless tobacco: Never Used  Substance and Sexual Activity  . Alcohol use: No  . Drug use: No  . Sexual activity: Never  Lifestyle  . Physical activity:    Days per week: Not on file    Minutes per session: Not on file  . Stress: Not on file  Relationships  . Social connections:    Talks on phone: Not on file    Gets together: Not on file    Attends religious service: Not on file    Active member of club or organization: Not on file    Attends meetings of clubs or organizations: Not on file    Relationship status: Not on file  . Intimate partner violence:    Fear of current or ex partner: Not on file    Emotionally abused: Not on file    Physically abused: Not on file    Forced sexual activity: Not on file  Other Topics Concern  . Not on file  Social History Narrative   Lives at home with husband.   Right-handed.   3 cups caffeine per day.     PHYSICAL EXAM   Vitals:    01/18/18 0829  BP: 132/86  Pulse: 72  Weight: 234 lb (106.1 kg)  Height: 5\' 6"  (1.676 m)    Not recorded      Body mass index is 37.77 kg/m.  PHYSICAL EXAMNIATION:  Gen: NAD, conversant, well nourised, obese, well groomed                     Cardiovascular: Regular rate rhythm, no peripheral edema, warm, nontender. Eyes: Conjunctivae clear without exudates or hemorrhage Neck: Supple, no carotid bruits. Pulmonary: Clear to auscultation bilaterally   NEUROLOGICAL EXAM:  MENTAL STATUS: Speech:    Speech is normal; fluent and spontaneous with normal comprehension.  Cognition:     Orientation to time, place and person     Normal recent and remote memory     Normal Attention span and concentration     Normal Language, naming, repeating,spontaneous speech     Fund of knowledge   CRANIAL NERVES: CN II: Visual fields are full to confrontation. Fundoscopic exam is normal with sharp discs and no vascular changes. Pupils are round equal and briskly reactive to light. CN III, IV, VI: extraocular movement are normal. No ptosis. CN V: Facial sensation is intact to pinprick in all 3 divisions bilaterally. Corneal responses are intact.  CN VII: Face is symmetric with normal eye closure and smile. CN VIII: Hearing is normal to rubbing fingers CN IX, X: Palate elevates symmetrically. Phonation is normal. CN XI: Head turning and shoulder shrug are intact CN XII: Tongue is midline with normal movements and no atrophy.  MOTOR: There is no pronator drift of out-stretched arms. Muscle bulk and tone are normal. Muscle strength is normal.  REFLEXES: Reflexes are 1 and symmetric at the biceps, triceps, knees, and ankles. Plantar responses are flexor.  SENSORY: Intact to light touch, pinprick, positional sensation and vibratory sensation are intact in fingers and toes.  COORDINATION: Rapid alternating movements and fine finger movements are intact. There is no dysmetria on finger-to-nose  and heel-knee-shin.    GAIT/STANCE: Mildly antalgic   DIAGNOSTIC DATA (LABS, IMAGING, TESTING) - I reviewed patient records, labs, notes, testing and imaging myself where available.  ASSESSMENT AND PLAN  Summer [name] is a 66 y.o. female   Complex partial seizure with secondary generalization  Most recent recurrent seizure was in 2016  Now on polypharmacy lamotrigine 150 twice a day, Keppra 500 mg twice a day Reported right hemifacial paresthesia Osteoporosis  Proceed with MRI of the brain with and without contrast  EEG  May consider tapering off Keppra if there is no significant abnormalities on EEG  Marcial Pacas, M.D. Ph.D.  [facility] [address] [place], [place] [address] Ph: [phone] Fax: [phone]  CC: Referring Provider

## 2018-01-19 ENCOUNTER — Ambulatory Visit (INDEPENDENT_AMBULATORY_CARE_PROVIDER_SITE_OTHER): Payer: Medicare Other | Admitting: Orthotics

## 2018-01-19 DIAGNOSIS — M216X9 Other acquired deformities of unspecified foot: Secondary | ICD-10-CM

## 2018-01-19 DIAGNOSIS — M2040 Other hammer toe(s) (acquired), unspecified foot: Secondary | ICD-10-CM

## 2018-01-19 NOTE — Progress Notes (Signed)
Patient came in today to pick up custom made foot orthotics.  The goals were accomplished and the patient reported no dissatisfaction with said orthotics.  Patient was advised of breakin period and how to report any issues. 

## 2018-01-24 NOTE — Progress Notes (Signed)
[facility] [employer] PREP Weekly Session   Patient Details  Name: Summer [name] MRN: [id number] Date of Birth: 1950-12-19 Age: 67 y.o. PCP: Briscoe Deutscher, DO  Vitals:   01/24/18 0959  Weight: 230 lb (104.3 kg)    [facility] [employer] Weekly seesion - 01/24/18 0900      Weekly Session   Topic Discussed  Expectations and non-scale victories    Minutes exercised this week  150 minutes 90cardio/30strength/36flexibility   90cardio/30strength/22flexibility   Classes attended to date  5      Things you are grateful for:"Got the project done:)" Nutrition celebrations for the week:"Salads before dinne.  Adding black beans to chili" Barriers:"night eating"   [name] 01/24/2018, 10:00 AM

## 2018-02-21 ENCOUNTER — Encounter: Payer: Self-pay | Admitting: Neurology

## 2018-02-22 ENCOUNTER — Other Ambulatory Visit: Payer: Medicare Other

## 2018-02-24 ENCOUNTER — Inpatient Hospital Stay: Admission: RE | Admit: 2018-02-24 | Payer: Medicare Other | Source: Ambulatory Visit

## 2018-03-08 ENCOUNTER — Telehealth: Payer: Self-pay | Admitting: Neurology

## 2018-03-08 NOTE — Telephone Encounter (Signed)
Pt called to r/s her EEG, unable to get an appt in before office visit with Dr. Krista Blue on 6/4. Pt aware that either the RN or myself may call to push office visit to after the EEG if Dr. Krista Blue would prefer.

## 2018-03-09 ENCOUNTER — Ambulatory Visit
Admission: RE | Admit: 2018-03-09 | Discharge: 2018-03-09 | Disposition: A | Discharge status: Home / Self Care | Payer: Medicare Other | Source: Ambulatory Visit | Attending: Neurology | Admitting: Neurology

## 2018-03-09 DIAGNOSIS — R202 Paresthesia of skin: Secondary | ICD-10-CM

## 2018-03-09 DIAGNOSIS — R209 Unspecified disturbances of skin sensation: Secondary | ICD-10-CM

## 2018-03-09 DIAGNOSIS — G40209 Localization-related (focal) (partial) symptomatic epilepsy and epileptic syndromes with complex partial seizures, not intractable, without status epilepticus: Secondary | ICD-10-CM

## 2018-03-09 NOTE — Telephone Encounter (Signed)
Called pt was able to r/s her office visit to 9/5 at [place]

## 2018-03-09 NOTE — Telephone Encounter (Signed)
The patient was scheduled today at [facility] to have her MRI done. But the patient had to leave for some reason. In order for them to reschedule with the patient they will need a new MRI order put in. When you get a chance can you put a new order in.

## 2018-03-09 NOTE — Addendum Note (Signed)
Addended by: Marcial Pacas on: 03/09/2018 12:18 PM   Modules accepted: Orders

## 2018-03-09 NOTE — Telephone Encounter (Signed)
MRI brain w/wo order was replaced.

## 2018-03-09 NOTE — Telephone Encounter (Signed)
Thank you :)

## 2018-03-09 NOTE — Telephone Encounter (Signed)
I was going to wait to decide until after she had her MRI today, but it appears she is needing to reschedule that as well. Would you mind rescheduling her OV with Dr. Krista Blue to a date after her EEG, that way we'll have all the results to review at that time, thanks!

## 2018-03-18 ENCOUNTER — Ambulatory Visit
Admission: RE | Admit: 2018-03-18 | Discharge: 2018-03-18 | Disposition: A | Discharge status: Home / Self Care | Payer: Medicare Other | Source: Ambulatory Visit | Attending: Neurology | Admitting: Neurology

## 2018-03-18 DIAGNOSIS — R569 Unspecified convulsions: Secondary | ICD-10-CM | POA: Diagnosis not present

## 2018-03-18 MED ORDER — GADOBENATE DIMEGLUMINE 529 MG/ML IV SOLN
20.0000 mL | Freq: Once | INTRAVENOUS | Status: AC | PRN
  Administered 2018-03-18: 20 mL via INTRAVENOUS

## 2018-03-22 ENCOUNTER — Telehealth: Payer: Self-pay | Admitting: Neurology

## 2018-03-22 NOTE — Telephone Encounter (Signed)
Spoke to patient - she is aware of results and verbalized understanding. 

## 2018-03-22 NOTE — Telephone Encounter (Signed)
Please call patient, MRI of brain showed mild age related change, there is no acute abnormality.  IMPRESSION: This MRI of the brain with and without contrast shows the following: 1.   Some scattered T2/FLAIR hypertense foci in the hemispheres consistent with minimal chronic microvascular ischemic change.  None of these appear to be acute. 2.   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  This is usually a insignificant incidental finding.  However, this can also be seen with elevated intracranial pressure. 3.    There are no acute findings and there is a normal enhancement pattern.

## 2018-03-29 ENCOUNTER — Ambulatory Visit: Payer: Medicare Other | Admitting: Neurology

## 2018-04-04 ENCOUNTER — Encounter: Payer: Self-pay | Admitting: Family Medicine

## 2018-04-06 ENCOUNTER — Encounter

## 2018-04-06 ENCOUNTER — Ambulatory Visit: Payer: Medicare Other | Admitting: Neurology

## 2018-04-06 DIAGNOSIS — R209 Unspecified disturbances of skin sensation: Secondary | ICD-10-CM

## 2018-04-06 DIAGNOSIS — G40209 Localization-related (focal) (partial) symptomatic epilepsy and epileptic syndromes with complex partial seizures, not intractable, without status epilepticus: Secondary | ICD-10-CM

## 2018-04-06 DIAGNOSIS — R202 Paresthesia of skin: Secondary | ICD-10-CM

## 2018-04-08 NOTE — Procedures (Signed)
   HISTORY: 67 year old female, presented with complex partial seizure with secondary generalization  TECHNIQUE:  16 channel EEG was performed based on standard 10-16 international system. One channel was dedicated to EKG, which has demonstrates normal sinus rhythm of beats per minutes.  Upon awakening, the posterior background activity was well-developed, with mixed and alpha range activity, reactive to eye opening and closure.  There was no evidence of epileptiform discharge.  Photic stimulation was performed, which induced a symmetric photic driving.  Hyperventilation was performed, there was no abnormality elicit.  No sleep was achieved.  CONCLUSION: This is a  normal awake EEG.  There is no electrodiagnostic evidence of epileptiform discharge.  Marcial Pacas, M.D. Ph.D.  [facility] [place], [place] [address] Phone: [phone] Fax:      [phone]

## 2018-04-11 ENCOUNTER — Encounter: Payer: Self-pay | Admitting: Family Medicine

## 2018-04-12 ENCOUNTER — Other Ambulatory Visit: Payer: Self-pay

## 2018-04-12 ENCOUNTER — Telehealth: Payer: Self-pay | Admitting: Family Medicine

## 2018-04-12 ENCOUNTER — Ambulatory Visit: Payer: Self-pay

## 2018-04-12 DIAGNOSIS — Z1239 Encounter for other screening for malignant neoplasm of breast: Secondary | ICD-10-CM

## 2018-04-12 NOTE — Telephone Encounter (Signed)
Called patient l/m to let patient know Order has been put in she can call where she had last mammogram to make app.

## 2018-04-12 NOTE — Telephone Encounter (Unsigned)
Copied from [place] [phone]. Topic: Quick Communication - See Telephone Encounter >> Apr 12, 2018 10:22 AM Neva Seat wrote: Pt is wondering if her mammogram can be done now.  Pt needing an order for her mammogram to have that done soon. Please call pt to discuss.

## 2018-04-12 NOTE — Telephone Encounter (Signed)
See note

## 2018-04-12 NOTE — Telephone Encounter (Signed)
Phone call from pt.; reported noting a "light red hue to toilet water, about 3-4 times/ week, over past month.  Stated the stool looks brown with some darker spots throughout.  Denied any blood noted on the stool.  Denied any abdominal pain or rectal pain.  Denied dizziness, fatigue, weakness, nausea or vomiting.  Denied presence of hemorrhoids that she is aware of.  Denied diarrhea or constipation. Denied taking daily ASA, or any blood thinners.  Appt. Scheduled with PCP tomorrow at 7:00 AM.  Care advice given per protocol.  Pt. verb. understanding, and agrees with plan.       Reason for Disposition . MILD rectal bleeding (more than just a few drops or streaks)  Answer Assessment - Initial Assessment Questions 1. APPEARANCE of BLOOD: "What color is it?" "Is it passed separately, on the surface of the stool, or mixed in with the stool?"      "Light red or reddish hue" to toilet water 2. AMOUNT: "How much blood was passed?"     Difficult to know, as mixed with water in toilet; associated with larger  bm 3. FREQUENCY: "How many times has blood been passed with the stools?"      3-4 x / week ; no change in the degree of reddish hugh of toilet water 4. ONSET: "When was the blood first seen in the stools?" (Days or weeks)      Approx. One month ago 5. DIARRHEA: "Is there also some diarrhea?" If so, ask: "How many diarrhea stools were passed in past 24 hours?"      Denied  6. CONSTIPATION: "Do you have constipation?" If so, "How bad is it?"     Denied 7. RECURRENT SYMPTOMS: "Have you had blood in your stools before?" If so, ask: "When was the last time?" and "What happened that time?"      2 years ago, similar symptoms; did cologuard test and had colonoscopy with 2 polyps removed 8. BLOOD THINNERS: "Do you take any blood thinners?" (e.g., Coumadin/warfarin, Pradaxa/dabigatran, aspirin)    Denied any blood thinners 9. OTHER SYMPTOMS: "Do you have any other symptoms?"  (e.g., abdominal pain, vomiting,  dizziness, fever)     Denied dizziness, weakness, or fatigue; denied pain with stool evacuation or abdominal pain; denied nausea or vomiting.  Protocols used: RECTAL BLEEDING-A-AH

## 2018-04-13 ENCOUNTER — Ambulatory Visit (INDEPENDENT_AMBULATORY_CARE_PROVIDER_SITE_OTHER): Payer: Medicare Other

## 2018-04-13 ENCOUNTER — Encounter: Payer: Self-pay | Admitting: Family Medicine

## 2018-04-13 ENCOUNTER — Ambulatory Visit: Payer: Medicare Other | Admitting: Family Medicine

## 2018-04-13 VITALS — BP 108/68 | HR 65 | Temp 98.1°F | Ht 66.0 in | Wt 233.2 lb

## 2018-04-13 DIAGNOSIS — M25551 Pain in right hip: Secondary | ICD-10-CM

## 2018-04-13 DIAGNOSIS — D125 Benign neoplasm of sigmoid colon: Secondary | ICD-10-CM

## 2018-04-13 DIAGNOSIS — D500 Iron deficiency anemia secondary to blood loss (chronic): Secondary | ICD-10-CM | POA: Diagnosis not present

## 2018-04-13 DIAGNOSIS — E669 Obesity, unspecified: Secondary | ICD-10-CM | POA: Diagnosis not present

## 2018-04-13 DIAGNOSIS — K625 Hemorrhage of anus and rectum: Secondary | ICD-10-CM

## 2018-04-13 DIAGNOSIS — Z9189 Other specified personal risk factors, not elsewhere classified: Secondary | ICD-10-CM

## 2018-04-13 DIAGNOSIS — Z1159 Encounter for screening for other viral diseases: Secondary | ICD-10-CM

## 2018-04-13 LAB — CBC WITH DIFFERENTIAL/PLATELET
Basophils Absolute: 0 10*3/uL (ref 0.0–0.1)
Basophils Relative: 0.3 % (ref 0.0–3.0)
Eosinophils Absolute: 0.1 10*3/uL (ref 0.0–0.7)
Eosinophils Relative: 1.9 % (ref 0.0–5.0)
HCT: 43.5 % (ref 36.0–46.0)
Hemoglobin: 14.8 g/dL (ref 12.0–15.0)
Lymphocytes Relative: 41.7 % (ref 12.0–46.0)
Lymphs Abs: 1.9 10*3/uL (ref 0.7–4.0)
MCHC: 33.9 g/dL (ref 30.0–36.0)
MCV: 90.1 fl (ref 78.0–100.0)
Monocytes Absolute: 0.4 10*3/uL (ref 0.1–1.0)
Monocytes Relative: 8.6 % (ref 3.0–12.0)
Neutro Abs: 2.2 10*3/uL (ref 1.4–7.7)
Neutrophils Relative %: 47.5 % (ref 43.0–77.0)
Platelets: 247 10*3/uL (ref 150.0–400.0)
RBC: 4.83 Mil/uL (ref 3.87–5.11)
RDW: 13.5 % (ref 11.5–15.5)
WBC: 4.6 10*3/uL (ref 4.0–10.5)

## 2018-04-13 NOTE — Progress Notes (Signed)
Summer [name] is a 67 y.o. female is here for follow up.  History of Present Illness:   HPI: Phone call from pt; reported noting a "light red hue to toilet water, about 3-4 times/ week, over past month.  Stated the stool looks brown with some darker spots throughout.  Denied any blood noted on the stool.  Denied any abdominal pain or rectal pain.  Denied dizziness, fatigue, weakness, nausea or vomiting.  Denied presence of hemorrhoids that she is aware of.  Denied diarrhea or constipation. Denied taking daily ASA, or any blood thinners.   Right hip pain, with radiation to groin. No injury but known OA. Hard to lay on the right side due to pain.  Health Maintenance Due  Topic Date Due  . Hepatitis C Screening  [birth year]  . MAMMOGRAM  09/21/2001  . COLONOSCOPY  09/21/2001   Depression screen PHQ 2/9 05/20/2017  Decreased Interest 0  Down, Depressed, Hopeless 0  PHQ - 2 Score 0   PMHx, SurgHx, SocialHx, FamHx, Medications, and Allergies were reviewed in the Visit Navigator and updated as appropriate.   Patient Active Problem List   Diagnosis Date Noted  . Partial symptomatic epilepsy with complex partial seizures, not intractable, without status epilepticus ([place]) 01/18/2018  . Facial paresthesia 01/18/2018  . S/P total knee replacement 09/07/2017  . Obesity (BMI 30-39.9) 05/21/2017  . Anemia 05/21/2017  . Adenomatous polyp of sigmoid colon 03/18/2017  . Seizure ([place]) 01/25/2017  . Iron deficiency anemia 01/12/2017  . Neck pain 01/11/2017  . Osteoporosis 01/11/2017  . Lentigines 01/11/2017  . Unilateral primary osteoarthritis, left knee 12/11/2016  . Primary osteoarthritis, right shoulder 12/11/2016   Social History   Tobacco Use  . Smoking status: Never Smoker  . Smokeless tobacco: Never Used  Substance Use Topics  . Alcohol use: No  . Drug use: No   Current Medications and Allergies:   Current Outpatient Medications:  .  Acetaminophen (TYLENOL PO), Take by mouth  as needed., Disp: , Rfl:  .  Calcium-Vitamin D-Vitamin K 240-9735-32 MG-UNT-MCG TABS, Take 1 tablet by mouth 2 (two) times daily. Also includes Vitamin C, Disp: , Rfl:  .  Cholecalciferol (SM VITAMIN D3) 4000 units CAPS, Take 4,000 Units by mouth daily., Disp: , Rfl:  .  denosumab (PROLIA) 60 MG/ML SOLN injection, Inject 60 mg into the skin every 6 (six) months. Administer in upper arm, thigh, or abdomen, Disp: , Rfl:  .  ferrous sulfate (FERROUSUL) 325 (65 FE) MG tablet, Take 1 tablet (325 mg total) 3 (three) times daily with meals by mouth. (Patient taking differently: Take 325 mg by mouth daily with breakfast. ), Disp: , Rfl: 3 .  Glucosamine-Chondroit-Vit C-Mn (GLUCOSAMINE CHONDR 1500 COMPLX PO), Take 1 capsule by mouth daily., Disp: , Rfl:  .  lamoTRIgine (LAMICTAL) 150 MG tablet, Take 1 tablet (150 mg total) by mouth 2 (two) times daily., Disp: 180 tablet, Rfl: 1 .  levETIRAcetam (KEPPRA) 500 MG tablet, Take 1 tablet (500 mg total) by mouth 2 (two) times daily., Disp: 180 tablet, Rfl: 1 .  multivitamin-lutein (OCUVITE-LUTEIN) CAPS capsule, Take 1 capsule by mouth daily., Disp: , Rfl:  .  Omega-3 Fatty Acids (OMEGA 3 PO), Take 1 capsule by mouth daily., Disp: , Rfl:  .  vitamin E 600 UNIT capsule, Take 600 Units by mouth daily., Disp: , Rfl:    Allergies  Allergen Reactions  . Bee Venom Swelling   Review of Systems   Pertinent items are noted  in the HPI. Otherwise, ROS is negative.  Vitals:   Vitals:   04/13/18 0703  BP: 108/68  Pulse: 65  Temp: 98.1 F (36.7 C)  TempSrc: Oral  SpO2: 97%  Weight: 233 lb 3.2 oz (105.8 kg)  Height: 5\' 6"  (1.676 m)     Body mass index is 37.64 kg/m.  Physical Exam:   Physical Exam  Constitutional: She appears well-nourished.  HENT:  Head: Normocephalic and atraumatic.  Eyes: Pupils are equal, round, and reactive to light. EOM are normal.  Neck: Normal range of motion. Neck supple.  Cardiovascular: Normal rate, regular rhythm, normal  heart sounds and intact distal pulses.  Pulmonary/Chest: Effort normal.  Abdominal: Soft.  Genitourinary: Rectal exam shows anal tone abnormal. Rectal exam shows no external hemorrhoid, no internal hemorrhoid, no fissure and no tenderness.  Musculoskeletal:       Right hip: She exhibits decreased range of motion and bony tenderness.  Skin: Skin is warm.  Psychiatric: She has a normal mood and affect. Her behavior is normal.  Nursing note and vitals reviewed.  Assessment and Plan:   Summer [name] was seen today for rectal bleeding.  Diagnoses and all orders for this visit:  BRBPR (bright red blood per rectum) -     Fecal occult blood, imunochemical; Future  Iron deficiency anemia due to chronic blood loss -     CBC with Differential/Platelet -     Comprehensive metabolic panel  Obesity (BMI 30-39.9)  Encounter for hepatitis C virus screening test for high risk patient -     Hepatitis C antibody  Adenomatous polyp of sigmoid colon Comments: x 2 on last colonoscopy in 2017/02/02. Father died of colon cancer.   Right hip pain -     DG HIP UNILAT W OR W/O PELVIS 2-3 VIEWS RIGHT    . Reviewed expectations re: course of current medical issues. . Discussed self-management of symptoms. . Outlined signs and symptoms indicating need for more acute intervention. . Patient verbalized understanding and all questions were answered. Marland Kitchen Health Maintenance issues including appropriate healthy diet, exercise, and smoking avoidance were discussed with patient. . See orders for this visit as documented in the electronic medical record. . Patient received an After Visit Summary.  Briscoe Deutscher, DO [place], [place] 04/13/2018  Future Appointments  Date Time Provider [place]  06/30/2018  4:00 PM Marcial Pacas, MD [facility] None  08/04/2018  9:00 AM Williemae Area, RN [facility]    CMA served as scribe during this visit. History, Physical, and Plan performed by medical provider.  The above documentation has been reviewed and is accurate and complete. Briscoe Deutscher, D.O.

## 2018-04-14 ENCOUNTER — Encounter: Payer: Self-pay | Admitting: Neurology

## 2018-04-14 LAB — COMPREHENSIVE METABOLIC PANEL
AG Ratio: 1.9 (calc) (ref 1.0–2.5)
ALT: 17 U/L (ref 6–29)
AST: 20 U/L (ref 10–35)
Albumin: 4.3 g/dL (ref 3.6–5.1)
Alkaline phosphatase (APISO): 55 U/L (ref 33–130)
BUN: 15 mg/dL (ref 7–25)
CO2: 28 mmol/L (ref 20–32)
Calcium: 9.5 mg/dL (ref 8.6–10.4)
Chloride: 103 mmol/L (ref 98–110)
Creat: 0.63 mg/dL (ref 0.50–0.99)
Globulin: 2.3 g/dL (calc) (ref 1.9–3.7)
Glucose, Bld: 88 mg/dL (ref 65–99)
Potassium: 4.2 mmol/L (ref 3.5–5.3)
Sodium: 140 mmol/L (ref 135–146)
Total Bilirubin: 0.5 mg/dL (ref 0.2–1.2)
Total Protein: 6.6 g/dL (ref 6.1–8.1)

## 2018-04-14 LAB — HEPATITIS C ANTIBODY
Hepatitis C Ab: NONREACTIVE
SIGNAL TO CUT-OFF: 0.01 (ref <1.00)

## 2018-04-17 ENCOUNTER — Other Ambulatory Visit: Payer: Self-pay | Admitting: Family Medicine

## 2018-04-17 DIAGNOSIS — R569 Unspecified convulsions: Secondary | ICD-10-CM

## 2018-04-18 ENCOUNTER — Encounter: Payer: Self-pay | Admitting: Family Medicine

## 2018-04-18 NOTE — Telephone Encounter (Signed)
Please advise on refill.

## 2018-05-06 ENCOUNTER — Ambulatory Visit: Payer: Medicare Other

## 2018-05-07 ENCOUNTER — Other Ambulatory Visit: Payer: Self-pay | Admitting: Family Medicine

## 2018-05-07 DIAGNOSIS — R569 Unspecified convulsions: Secondary | ICD-10-CM

## 2018-05-09 NOTE — Telephone Encounter (Signed)
Please advise on refill.

## 2018-05-23 ENCOUNTER — Ambulatory Visit: Payer: Medicare Other

## 2018-05-23 ENCOUNTER — Ambulatory Visit
Admission: RE | Admit: 2018-05-23 | Discharge: 2018-05-23 | Disposition: A | Discharge status: Home / Self Care | Payer: Medicare Other | Source: Ambulatory Visit | Attending: Family Medicine | Admitting: Family Medicine

## 2018-05-23 DIAGNOSIS — Z1239 Encounter for other screening for malignant neoplasm of breast: Secondary | ICD-10-CM

## 2018-05-25 ENCOUNTER — Other Ambulatory Visit: Payer: Self-pay | Admitting: Family Medicine

## 2018-05-25 DIAGNOSIS — R928 Other abnormal and inconclusive findings on diagnostic imaging of breast: Secondary | ICD-10-CM

## 2018-05-30 ENCOUNTER — Encounter: Payer: Self-pay | Admitting: Family Medicine

## 2018-05-31 ENCOUNTER — Ambulatory Visit
Admission: RE | Admit: 2018-05-31 | Discharge: 2018-05-31 | Disposition: A | Discharge status: Home / Self Care | Payer: Medicare Other | Source: Ambulatory Visit | Attending: Family Medicine | Admitting: Family Medicine

## 2018-05-31 ENCOUNTER — Encounter: Payer: Self-pay | Admitting: Family Medicine

## 2018-05-31 DIAGNOSIS — R928 Other abnormal and inconclusive findings on diagnostic imaging of breast: Secondary | ICD-10-CM

## 2018-06-12 ENCOUNTER — Other Ambulatory Visit: Payer: Self-pay | Admitting: Family Medicine

## 2018-06-12 DIAGNOSIS — R569 Unspecified convulsions: Secondary | ICD-10-CM

## 2018-06-13 ENCOUNTER — Encounter: Payer: Self-pay | Admitting: Family Medicine

## 2018-06-13 ENCOUNTER — Encounter: Payer: Self-pay | Admitting: Surgical

## 2018-06-14 ENCOUNTER — Ambulatory Visit: Payer: Medicare Other

## 2018-06-16 ENCOUNTER — Telehealth: Payer: Self-pay

## 2018-06-16 NOTE — Telephone Encounter (Signed)
Received a Prior Authorization  Approval for denosumab (PROLIA) 60 MG/ML SOLN injection valid from 06/08/18-06/07/2019 (2 visits).

## 2018-06-19 ENCOUNTER — Encounter: Payer: Self-pay | Admitting: Family Medicine

## 2018-06-19 ENCOUNTER — Telehealth: Payer: Medicare Other | Admitting: Physician Assistant

## 2018-06-19 DIAGNOSIS — H60399 Other infective otitis externa, unspecified ear: Secondary | ICD-10-CM

## 2018-06-19 MED ORDER — NEOMYCIN-POLYMYXIN-HC 3.5-10000-1 OT SOLN: OTIC | 0 refills | Status: DC

## 2018-06-19 MED ORDER — AMOXICILLIN-POT CLAVULANATE 500-125 MG PO TABS: 1.0000 | ORAL_TABLET | Freq: Three times a day (TID) | ORAL | 0 refills | Status: DC

## 2018-06-19 NOTE — Progress Notes (Signed)
E Visit for Swimmer's Ear  We are sorry that you are not feeling well. Here is how we plan to help!  Based on what you have shared with me it looks like you have swimmers ear. Swimmer's ear is a redness or swelling, irritation, or infection of your outer ear canal.  These symptoms usually occur within a few days of swimming.  Your ear canal is a tube that goes from the opening of the ear to the eardrum.  When water stays in your ear canal, germs can grow.  This is a painful condition that often happens to children and swimmers of all ages.  It is not contagious and oral antibiotics are not required to treat uncomplicated swimmer's ear.  The usual symptoms include: Itching inside the ear, Redness or a sense of swelling in the ear, Pain when the ear is tugged on when pressure is placed on the ear, Pus draining from the infected ear. and I have prescribed: Neomycin 0.35%, polymyxin B 10,000 units/mL, and hydrocortisone 0,5% otic solution 4 drops in affected ears four times a day until completed  Based on what you have told me you may have a bacterial infection. In addition to the ear drops I have prescribed an oral antibiotic: and Augmentin 625mg  one tablet by mouth twice a day for 10 days  In certain cases swimmer's ear may progress to a more serious bacterial infection of the middle or inner ear.  If you have a fever 102 and up and significantly worsening symptoms, this could indicate a more serious infection moving to the middle/inner and needs face to face evaluation in an office by a provider.  Your symptoms should improve over the next 3 days and should resolve in about 7 days.  HOME CARE:   Wash your hands frequently.  Do not place the tip of the bottle on your ear or touch it with your fingers.  You can take Acetominophen 650 mg every 4-6 hours as needed for pain.  If pain is severe or moderate, you can apply a heating pad (set on low) or hot water bottle (wrapped in a towel) to outer ear for  20 minutes.  This will also increase drainage.  Avoid ear plugs  Do not use Q-tips  After showers, help the water run out by tilting your head to one side.  GET HELP RIGHT AWAY IF:   Fever is over 102.2 degrees.  You develop progressive ear pain or hearing loss.  Ear symptoms persist longer than 3 days after treatment.  MAKE SURE YOU:   Understand these instructions.  Will watch your condition.  Will get help right away if you are not doing well or get worse.  TO PREVENT SWIMMER'S EAR:  Use a bathing cap or custom fitted swim molds to keep your ears dry.  Towel off after swimming to dry your ears.  Tilt your head or pull your earlobes to allow the water to escape your ear canal.  If there is still water in your ears, consider using a hairdryer on the lowest setting.  Thank you for choosing an e-visit. Your e-visit answers were reviewed by a board certified advanced clinical practitioner to complete your personal care plan. Depending upon the condition, your plan could have included both over the counter or prescription medications. Please review your pharmacy choice. Be sure that the pharmacy you have chosen is open so that you can pick up your prescription now.  If there is a problem you may  message your provider in [place] to have the prescription routed to another pharmacy. Your safety is important to [place]. If you have drug allergies check your prescription carefully.  For the next 24 hours, you can use MyChart to ask questions about today's visit, request a non-urgent call back, or ask for a work or school excuse from your e-visit provider. You will get an email in the next two days asking about your experience. I hope that your e-visit has been valuable and will speed your recovery.

## 2018-06-20 ENCOUNTER — Ambulatory Visit: Payer: Self-pay | Admitting: *Deleted

## 2018-06-20 NOTE — Telephone Encounter (Signed)
Pt called with wanting to know if she should make an appointment. She had an ear and tooth pain on yesterday. She did an E-Visit and was prescribed an antibiotic and ear drops. She started the two prescriptions yesterday and today she feels a lot better. She denies having a fever or any other symptoms.  She was advised to continue with the antibiotic and drops and after a couple days if she starts feeling worst to call back and schedule an office visit. Pt voiced understanding.  Will route to [facility] at [place].

## 2018-06-20 NOTE — Telephone Encounter (Signed)
See note

## 2018-06-22 ENCOUNTER — Ambulatory Visit (INDEPENDENT_AMBULATORY_CARE_PROVIDER_SITE_OTHER): Payer: Medicare Other

## 2018-06-22 DIAGNOSIS — M810 Age-related osteoporosis without current pathological fracture: Secondary | ICD-10-CM | POA: Diagnosis not present

## 2018-06-22 MED ORDER — DENOSUMAB 60 MG/ML ~~LOC~~ SOSY
60.0000 mg | PREFILLED_SYRINGE | Freq: Once | SUBCUTANEOUS | Status: AC
  Administered 2018-06-22: 60 mg via SUBCUTANEOUS

## 2018-06-22 NOTE — Progress Notes (Signed)
Per orders of Dr. [name], injection of Prolia  given by Francella Solian. Patient tolerated injection well.  Injection given in left arm

## 2018-06-23 ENCOUNTER — Telehealth: Payer: Self-pay

## 2018-06-23 NOTE — Telephone Encounter (Signed)
Patient came in office for prolia wanted to know when she needed to have another dexa scan done. She was informed that you are out of the office. And we will get back with her when you come back in the office.

## 2018-06-24 NOTE — Telephone Encounter (Signed)
Okay Prolia when due (can ask [facility]) and DEXA q 2 years.

## 2018-06-24 NOTE — Telephone Encounter (Signed)
Called patient back. Let her know that she needs dexa every two years. After reviewing let her know that she would be due for it 02/2019

## 2018-06-25 ENCOUNTER — Other Ambulatory Visit: Payer: Self-pay | Admitting: Physician Assistant

## 2018-06-27 ENCOUNTER — Encounter: Payer: Self-pay | Admitting: Family Medicine

## 2018-06-28 ENCOUNTER — Encounter: Payer: Self-pay | Admitting: Physician Assistant

## 2018-06-28 ENCOUNTER — Ambulatory Visit (INDEPENDENT_AMBULATORY_CARE_PROVIDER_SITE_OTHER): Payer: Medicare Other | Admitting: Physician Assistant

## 2018-06-28 VITALS — BP 124/80 | HR 78 | Temp 98.5°F | Ht 66.0 in | Wt 227.8 lb

## 2018-06-28 DIAGNOSIS — H6123 Impacted cerumen, bilateral: Secondary | ICD-10-CM

## 2018-06-28 DIAGNOSIS — K0889 Other specified disorders of teeth and supporting structures: Secondary | ICD-10-CM

## 2018-06-28 DIAGNOSIS — H60502 Unspecified acute noninfective otitis externa, left ear: Secondary | ICD-10-CM

## 2018-06-28 MED ORDER — CEFDINIR 300 MG PO CAPS: 300.0000 mg | ORAL_CAPSULE | Freq: Two times a day (BID) | ORAL | 0 refills | Status: AC

## 2018-06-28 MED ORDER — PENICILLIN V POTASSIUM 250 MG PO TABS: 250.0000 mg | ORAL_TABLET | Freq: Four times a day (QID) | ORAL | 0 refills | Status: AC

## 2018-06-28 NOTE — Progress Notes (Signed)
Summer [name] is a 67 y.o. female here for a follow up of a pre-existing problem.  History of Present Illness:   Chief Complaint  Patient presents with  . Ear Pain    HPI   Had an E-visit on 06/19/18 and was diagnosed with acute otitis media and externa. Was started on oral Augmentin and given a prescription for polymyxin ear drops. She has been using both faithfully. Took the Augmentin, finished on Sunday. Pain has decreased from 10 to 3. She is still having some intermittent pressure in her ear. She is also having a little bit of L lower tooth pain. Has plans to establish with a dentist later this month. Denies fever or cough, purulent discharge from tooth or gum line.  Past Medical History:  Diagnosis Date  . Adenomatous polyp of sigmoid colon 03/18/2017   Overview:  Added automatically from request for surgery [id number]  . Alcoholism ([place])    01/18/18 - no use in 5 years  . Asymptomatic varicose veins 01/11/2017  . Iron deficiency anemia 01/12/2017   Last Assessment & Plan:  Given script for ferrous sulfate and order for repeat cbc with diff and iron studies to be done in 1 month Will also refer pt back to [facility] for repeat colonoscopy and to dietician for nutritional counseling, per pt request  . Lentigines 01/11/2017  . Melanocytic nevi of left lower limb, including hip 01/11/2017  . Melanocytic nevi of left upper limb, including shoulder 01/11/2017  . Melanocytic nevi of right lower limb, including hip 01/11/2017  . Melanocytic nevi of right upper limb, including shoulder 01/11/2017  . Melanocytic nevi of trunk 01/11/2017  . Obesity (BMI 30-39.9) 05/21/2017  . Osteoporosis   . Plantar fasciitis   . 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 Seizure ([place]) 01/25/2017   LAST SEIZURE- 2004   . Sleep apnea   . Unilateral primary osteoarthritis, left knee 12/11/2016     Social History   Socioeconomic History  . Marital status: Married    Spouse name: Not on file  . Number of  children: 1  . Years of education: college  . Highest education level: Bachelor's degree (e.g., BA, AB, BS)  Occupational History  . Occupation: Retired  Scientific laboratory technician  . Financial resource strain: Not on file  . Food insecurity:    Worry: Not on file    Inability: Not on file  . Transportation needs:    Medical: Not on file    Non-medical: Not on file  Tobacco Use  . Smoking status: Never Smoker  . Smokeless tobacco: Never Used  Substance and Sexual Activity  . Alcohol use: No  . Drug use: No  . Sexual activity: Never  Lifestyle  . Physical activity:    Days per week: Not on file    Minutes per session: Not on file  . Stress: Not on file  Relationships  . Social connections:    Talks on phone: Not on file    Gets together: Not on file    Attends religious service: Not on file    Active member of club or organization: Not on file    Attends meetings of clubs or organizations: Not on file    Relationship status: Not on file  . Intimate partner violence:    Fear of current or ex partner: Not on file    Emotionally abused: Not on file    Physically abused: Not on file    Forced sexual activity: Not on  file  Other Topics Concern  . Not on file  Social History Narrative   Lives at home with husband.   Right-handed.   3 cups caffeine per day.    Past Surgical History:  Procedure Laterality Date  . BARIATRIC SURGERY    . ECTOPIC PREGNANCY SURGERY    . 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 Mitral valve prolapse Mother   . Osteoporosis Mother   . Memory loss Mother   . Colon cancer Father   . Alcoholism Father     Allergies  Allergen Reactions  . Bee Venom Swelling    Current Medications:   Current Outpatient Medications:  .  Acetaminophen (TYLENOL PO),  Take by mouth as needed., Disp: , Rfl:  .  Calcium-Vitamin D-Vitamin K 778-2423-53 MG-UNT-MCG TABS, Take 1 tablet by mouth 2 (two) times daily. Also includes Vitamin C, Disp: , Rfl:  .  cefdinir (OMNICEF) 300 MG capsule, Take 1 capsule (300 mg total) by mouth 2 (two) times daily for 7 days., Disp: 14 capsule, Rfl: 0 .  Cholecalciferol (SM VITAMIN D3) 4000 units CAPS, Take 4,000 Units by mouth daily., Disp: , Rfl:  .  denosumab (PROLIA) 60 MG/ML SOLN injection, Inject 60 mg into the skin every 6 (six) months. Administer in upper arm, thigh, or abdomen, Disp: , Rfl:  .  ferrous sulfate (FERROUSUL) 325 (65 FE) MG tablet, Take 1 tablet (325 mg total) 3 (three) times daily with meals by mouth. (Patient taking differently: Take 325 mg by mouth daily with breakfast. ), Disp: , Rfl: 3 .  Glucosamine-Chondroit-Vit C-Mn (GLUCOSAMINE CHONDR 1500 COMPLX PO), Take 1 capsule by mouth daily., Disp: , Rfl:  .  lamoTRIgine (LAMICTAL) 150 MG tablet, TAKE ONE TABLET BY MOUTH TWICE A DAY, Disp: 180 tablet, Rfl: 0 .  lamoTRIgine (LAMICTAL) 150 MG tablet, TAKE ONE TABLET BY MOUTH TWICE A DAY, Disp: 180 tablet, Rfl: 0 .  multivitamin-lutein (OCUVITE-LUTEIN) CAPS capsule, Take 1 capsule by mouth daily., Disp: , Rfl:  .  neomycin-polymyxin-hydrocortisone (CORTISPORIN) OTIC solution, Apply four drops into the affected ear up to four times daily., Disp: 10 mL, Rfl: 0 .  Omega-3 Fatty Acids (OMEGA 3 PO), Take 1 capsule by mouth daily., Disp: , Rfl:  .  penicillin v potassium (VEETID) 250 MG tablet, Take 1 tablet (250 mg total) by mouth 4 (four) times daily for 7 days., Disp: 28 tablet, Rfl: 0 .  vitamin E 600 UNIT capsule, Take 600 Units by mouth daily., Disp: , Rfl:    Review of Systems:   ROS  Negative unless otherwise specified per HPI.   Vitals:   Vitals:   06/28/18 1305  BP: 124/80  Pulse: 78  Temp: 98.5 F (36.9 C)  TempSrc: Oral  SpO2: 97%  Weight: 227 lb 12.8 oz (103.3 kg)  Height: 5\' 6"  (1.676 m)       Body mass index is 36.77 kg/m.  Physical Exam:   Physical Exam  Constitutional: She appears well-developed. She is cooperative.  Non-toxic appearance. She does not have a sickly appearance. She does not appear ill. No distress.  HENT:  Head: Normocephalic and atraumatic.  Right Ear: External ear normal.  Left Ear: External ear normal.  Nose: Nose normal. Right sinus exhibits no maxillary sinus tenderness and no frontal  sinus tenderness. Left sinus exhibits no maxillary sinus tenderness and no frontal sinus tenderness.  Mouth/Throat: Uvula is midline. No posterior oropharyngeal edema or posterior oropharyngeal erythema.    Bilateral ear with cerumen impaction  Eyes: Conjunctivae and lids are normal.  Neck: Trachea normal.  Cardiovascular: Normal rate, regular rhythm, S1 normal, S2 normal and normal heart sounds.  Pulmonary/Chest: Effort normal and breath sounds normal. She has no decreased breath sounds. She has no wheezes. She has no rhonchi. She has no rales.  Lymphadenopathy:    She has no cervical adenopathy.  Neurological: She is alert.  Skin: Skin is warm, dry and intact.  Psychiatric: She has a normal mood and affect. Her speech is normal and behavior is normal.  Nursing note and vitals reviewed.  Procedure: Cerumen Disimpaction Warm water was applied and gentle ear lavage performed bilaterally. There were no complications and following the disimpaction the tympanic membrane was visible bilaterally. Tympanic membranes are intact following the procedure -- L TM is erythematous  Auditory canals are normal.  The patient reported relief of symptoms after removal of cerumen.   Assessment and Plan:    Dayanna was seen today for ear pain.  Diagnoses and all orders for this visit:  Acute otitis externa of left ear, unspecified type No red flags on exam.  Will initiate Omnicef per orders. Discussed taking medications as prescribed. Reviewed return precautions including worsening  fever, SOB, worsening cough or other concerns. Push fluids and rest. I recommend that patient follow-up if symptoms worsen or persist despite treatment x 7-10 days, sooner if needed.  Tooth pain Suspect gingivitis. I recommended that she see if she can get in with her dentist soon.  Other orders -     cefdinir (OMNICEF) 300 MG capsule; Take 1 capsule (300 mg total) by mouth 2 (two) times daily for 7 days.  . Reviewed expectations re: course of current medical issues. . Discussed self-management of symptoms. . Outlined signs and symptoms indicating need for more acute intervention. . Patient verbalized understanding and all questions were answered. . See orders for this visit as documented in the electronic medical record. . Patient received an After-Visit Summary.   Inda Coke, PA-C

## 2018-06-28 NOTE — Patient Instructions (Addendum)
It was great to see you!  Please see your dentist soon.  May take 600mg  ibuprofen 3 times a day for the next 3 to 4 days to help with your pain.  Start the [employer].  Take care,  Inda Coke PA-C

## 2018-06-30 ENCOUNTER — Ambulatory Visit: Payer: Medicare Other | Admitting: Neurology

## 2018-06-30 ENCOUNTER — Encounter: Payer: Self-pay | Admitting: Neurology

## 2018-06-30 VITALS — BP 122/80 | HR 69 | Ht 66.0 in | Wt 225.0 lb

## 2018-06-30 DIAGNOSIS — R569 Unspecified convulsions: Secondary | ICD-10-CM

## 2018-06-30 MED ORDER — LEVETIRACETAM 500 MG PO TABS: ORAL_TABLET | ORAL | 4 refills | Status: DC

## 2018-06-30 NOTE — Patient Instructions (Signed)
Week Lamotrigine 150mg   Keppra 500mg  tabs  1st week 0.5/0.5 1/2  2nd week 0/0.5    3rd week 0/0

## 2018-06-30 NOTE — Progress Notes (Signed)
PATIENT: Summer [name] DOB: [birth year]  Chief Complaint  Patient presents with  . Seizures    No seizure activity reported.  She would like to discuss her MRI and EEG results.     HISTORICAL  Summer [name] is a 67 year old female, seen in refer by her primary care doctor Briscoe Deutscher, for evaluation of seizure, facial paresthesia, initial evaluation was on January 18, 2018.  She had a history of osteoporosis, is taking Prolia 60 mg every 6 months  History of complex partial seizure with secondary generalization  I reviewed previous neurology evaluation from [facility] clinic by Dr.Jehi  First seizure was in the labor room at age 51 after delivery, no warning signs,  Around 2015, she started binge drink for 3-4 years, she had recurrent seizure in February 2015, patient was amnestic of the event, husband got a call from her primary care physician the patient had a seizure during her routine appointment, with tongue biting, was admitted to [facility],    CT, MRI of the brain with and without contrast showed mild supratentorium small vessel disease, EEG showed no acute abnormality, no antiepileptic medication was given, record mentions that was thought to be due to alcohol withdrawal, patient stated that she was drinking heavily, hard liquor on a daily basis, but did stop drinking for weeks prior to the event in February 2015.  Second event July 2015, was at a work picnic, then woke up on the hospital, was witnessed by a physician there that she had a seizure, loss of consciousness with tonic-clonic activity of the extremities that lasted 5 minutes, followed by 30-60 minutes post ictal confusion, Keppra was started at 750 twice a day, later was increased to 1000 mg twice a day later, patient complains of feeling sleepy,  Third event was in [place], she was witnessed by her friend that she has transient episode of confusion,  Her medication was later changed to  lamotrigine 150 mg twice a day, Keppra 500 mg twice daily, stayed on that regimen since, she is tolerating the medication well, there was no recurrent seizures.   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  Today she also complains of more than 3 years history of constant right facial paresthesia, burning hypersensitivity, mild asymmetry of the face, she noticed more wrinkles in her left lower face while woke up in the morning time.  Laboratory evaluation in January 2019 showed mild elevated LDL 101, negative troponin, INR 1.01, d-dimer was mildly elevated at 0.95, negative troponin, normal BMP, CBC   UPDATE Sept 5 2019: She is doing very well, had no recurrent seizure, has quit drinking. Now taking keppra 500mg  bid and lamotrigine 150mg  bid.   We personally reviewed MRI brain w/wo in May 2019, there are chronic small vessel disease, no acute abnormalities.  EEG was normal in June 2019  REVIEW OF SYSTEMS: Full 14 system review of systems performed and notable only for ear pain, rest of ROS were negative.  ALLERGIES: Allergies  Allergen Reactions  . Bee Venom Swelling    HOME MEDICATIONS: Current Outpatient Medications  Medication Sig Dispense Refill  . Acetaminophen (TYLENOL PO) Take by mouth as needed.    . Calcium-Vitamin D-Vitamin K [id number] MG-UNT-MCG TABS Take 1 tablet by mouth 2 (two) times daily. Also includes Vitamin C    . cefdinir (OMNICEF) 300  MG capsule Take 1 capsule (300 mg total) by mouth 2 (two) times daily for 7 days. 14 capsule 0  . Cholecalciferol (SM VITAMIN D3) 4000 units CAPS Take 4,000 Units by mouth daily.    Marland Kitchen denosumab (PROLIA) 60 MG/ML SOLN injection Inject 60 mg into the skin every 6 (six) months. Administer in upper arm, thigh, or abdomen    . ferrous sulfate  (FERROUSUL) 325 (65 FE) MG tablet Take 1 tablet (325 mg total) 3 (three) times daily with meals by mouth. (Patient taking differently: Take 325 mg by mouth daily with breakfast. )  3  . Glucosamine-Chondroit-Vit C-Mn (GLUCOSAMINE CHONDR 1500 COMPLX PO) Take 1 capsule by mouth daily.    Marland Kitchen lamoTRIgine (LAMICTAL) 150 MG tablet TAKE ONE TABLET BY MOUTH TWICE A DAY 180 tablet 0  . levETIRAcetam (KEPPRA) 500 MG tablet Take 500 mg by mouth 2 (two) times daily.    . multivitamin-lutein (OCUVITE-LUTEIN) CAPS capsule Take 1 capsule by mouth daily.    . Omega-3 Fatty Acids (OMEGA 3 PO) Take 1 capsule by mouth daily.    . penicillin v potassium (VEETID) 250 MG tablet Take 1 tablet (250 mg total) by mouth 4 (four) times daily for 7 days. 28 tablet 0  . vitamin E 600 UNIT capsule Take 600 Units by mouth daily.     No current facility-administered medications for this visit.     PAST MEDICAL HISTORY: Past Medical History:  Diagnosis Date  . Adenomatous polyp of sigmoid colon 03/18/2017   Overview:  Added automatically from request for surgery [id number]  . Alcoholism ([place])    01/18/18 - no use in 5 years  . Asymptomatic varicose veins 01/11/2017  . Iron deficiency anemia 01/12/2017   Last Assessment & Plan:  Given script for ferrous sulfate and order for repeat cbc with diff and iron studies to be done in 1 month Will also refer pt back to [facility] for repeat colonoscopy and to dietician for nutritional counseling, per pt request  . Lentigines 01/11/2017  . Melanocytic nevi of left lower limb, including hip 01/11/2017  . Melanocytic nevi of left upper limb, including shoulder 01/11/2017  . Melanocytic nevi of right lower limb, including hip 01/11/2017  . Melanocytic nevi of right upper limb, including shoulder 01/11/2017  . Melanocytic nevi of trunk 01/11/2017  . Obesity (BMI 30-39.9) 05/21/2017  . Osteoporosis   . Plantar fasciitis   . 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 Seizure ([place]) 01/25/2017   LAST  SEIZURE- 2004   . Sleep apnea   . Unilateral primary osteoarthritis, left knee 12/11/2016    PAST SURGICAL HISTORY: Past Surgical History:  Procedure Laterality Date  . BARIATRIC SURGERY    . ECTOPIC PREGNANCY SURGERY    . TOTAL KNEE ARTHROPLASTY Left 09/07/2017   Procedure: LEFT TOTAL KNEE ARTHROPLASTY;  Surgeon: Paralee Cancel, MD;  Location: [facility];  Service: Orthopedics;  Laterality: Left;  Adductor Block    FAMILY HISTORY: Family History  Problem Relation Age of Onset  . Mitral valve prolapse Mother   . Osteoporosis Mother   . Memory loss Mother   . Colon cancer Father   . Alcoholism Father     SOCIAL HISTORY:  Social History   Socioeconomic History  . Marital status: Married    Spouse name: Not on file  . Number of children: 1  . Years of education: college  . Highest education level: Bachelor's degree (e.g., BA, AB, BS)  Occupational History  . Occupation: Retired  Social Needs  . Financial resource strain: Not on file  . Food insecurity:    Worry: Not on file    Inability: Not on file  . Transportation needs:    Medical: Not on file    Non-medical: Not on file  Tobacco Use  . Smoking status: Never Smoker  . Smokeless tobacco: Never Used  Substance and Sexual Activity  . Alcohol use: No  . Drug use: No  . Sexual activity: Never  Lifestyle  . Physical activity:    Days per week: Not on file    Minutes per session: Not on file  . Stress: Not on file  Relationships  . Social connections:    Talks on phone: Not on file    Gets together: Not on file    Attends religious service: Not on file    Active member of club or organization: Not on file    Attends meetings of clubs or organizations: Not on file    Relationship status: Not on file  . Intimate partner violence:    Fear of current or ex partner: Not on file    Emotionally abused: Not on file    Physically abused: Not on file    Forced sexual activity: Not on file  Other Topics Concern  . Not  on file  Social History Narrative   Lives at home with husband.   Right-handed.   3 cups caffeine per day.     PHYSICAL EXAM   Vitals:   06/30/18 1539  Weight: 225 lb (102.1 kg)  Height: 5\' 6"  (1.676 m)    Not recorded      Body mass index is 36.32 kg/m.  PHYSICAL EXAMNIATION:  Gen: NAD, conversant, well nourised, obese, well groomed                     Cardiovascular: Regular rate rhythm, no peripheral edema, warm, nontender. Eyes: Conjunctivae clear without exudates or hemorrhage Neck: Supple, no carotid bruits. Pulmonary: Clear to auscultation bilaterally   NEUROLOGICAL EXAM:  MENTAL STATUS: Speech:    Speech is normal; fluent and spontaneous with normal comprehension.  Cognition:     Orientation to time, place and person     Normal recent and remote memory     Normal Attention span and concentration     Normal Language, naming, repeating,spontaneous speech     Fund of knowledge   CRANIAL NERVES: CN II: Visual fields are full to confrontation. Fundoscopic exam is normal with sharp discs and no vascular changes. Pupils are round equal and briskly reactive to light. CN III, IV, VI: extraocular movement are normal. No ptosis. CN V: Facial sensation is intact to pinprick in all 3 divisions bilaterally. Corneal responses are intact.  CN VII: Face is symmetric with normal eye closure and smile. CN VIII: Hearing is normal to rubbing fingers CN IX, X: Palate elevates symmetrically. Phonation is normal. CN XI: Head turning and shoulder shrug are intact CN XII: Tongue is midline with normal movements and no atrophy.  MOTOR: There is no pronator drift of out-stretched arms. Muscle bulk and tone are normal. Muscle strength is normal.  REFLEXES: Reflexes are 1 and symmetric at the biceps, triceps, knees, and ankles. Plantar responses are flexor.  SENSORY: Intact to light touch, pinprick, positional sensation and vibratory sensation are intact in fingers and  toes.  COORDINATION: Rapid alternating movements and fine finger movements are intact. There is no dysmetria on finger-to-nose and heel-knee-shin.  GAIT/STANCE: Mildly antalgic   DIAGNOSTIC DATA (LABS, IMAGING, TESTING) - I reviewed patient records, labs, notes, testing and imaging myself where available.   ASSESSMENT AND PLAN  RICKIE GUTIERRES is a 67 y.o. female   Complex partial seizure with secondary generalization  Last seizure was in 2016  Now on polypharmacy lamotrigine 150 twice a day, Keppra 500 mg twice a day  After discussion, we decided to taper off lamotrigine, increase keppra to 500mg /1000mg .  Face to face time was 25 minutes, greater than 50% of the time was spent in counseling and coordination of care with the patient.     Marcial Pacas, M.D. Ph.D.  [facility] [address], [place], [place] [address] Ph: [phone] Fax: [phone]  CC: Referring Provider

## 2018-07-01 ENCOUNTER — Encounter: Payer: Self-pay | Admitting: Physician Assistant

## 2018-07-24 ENCOUNTER — Other Ambulatory Visit: Payer: Self-pay | Admitting: Family Medicine

## 2018-07-24 DIAGNOSIS — R569 Unspecified convulsions: Secondary | ICD-10-CM

## 2018-08-01 ENCOUNTER — Ambulatory Visit: Payer: Medicare Other | Admitting: *Deleted

## 2018-08-03 NOTE — Progress Notes (Deleted)
Subjective:   Summer [name] is a 67 y.o. female who presents for Medicare Annual (Subsequent) preventive examination.  Reports health as Last OV 04/13/2018   Diet Chol/hdl 3; trig 74   Exercise  Health Maintenance Due  Topic Date Due  . COLONOSCOPY  09/21/2001  . INFLUENZA VACCINE  05/26/2018   Osteoporosis Prolia 11/26/2017 and 06/22/2018 Mammogram 04/2018 Dexa 02/2017   Colonoscopy 08/2001;          Objective:     Vitals: There were no vitals taken for this visit.  There is no height or weight on file to calculate BMI.  Advanced Directives 11/04/2017 09/07/2017 09/07/2017 09/01/2017 06/28/2017 04/01/2017  Does Patient Have a Medical Advance Directive? Yes Yes Yes Yes No No  Type of Advance Directive Healthcare Power of [place] - -  Does patient want to make changes to medical advance directive? - No - Patient declined No - Patient declined No - Patient declined - -  Copy of [place] in Chart? Yes No - copy requested No - copy requested - - -    Tobacco Social History   Tobacco Use  Smoking Status Never Smoker  Smokeless Tobacco Never Used     Counseling given: Not Answered   Clinical Intake:     Past Medical History:  Diagnosis Date  . Adenomatous polyp of sigmoid colon 03/18/2017   Overview:  Added automatically from request for surgery [id number]  . Alcoholism ([place])    01/18/18 - no use in 5 years  . Asymptomatic varicose veins 01/11/2017  . Iron deficiency anemia 01/12/2017   Last Assessment & Plan:  Given script for ferrous sulfate and order for repeat cbc with diff and iron studies to be done in 1 month Will also refer pt back to [facility] for repeat colonoscopy and to dietician for nutritional counseling, per pt request  . Lentigines 01/11/2017  . Melanocytic nevi of left lower limb, including hip 01/11/2017  .  Melanocytic nevi of left upper limb, including shoulder 01/11/2017  . Melanocytic nevi of right lower limb, including hip 01/11/2017  . Melanocytic nevi of right upper limb, including shoulder 01/11/2017  . Melanocytic nevi of trunk 01/11/2017  . Obesity (BMI 30-39.9) 05/21/2017  . Osteoporosis   . Plantar fasciitis   . 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 Seizure ([place]) 01/25/2017   LAST SEIZURE- 2004   . Sleep apnea   . Unilateral primary osteoarthritis, left knee 12/11/2016   Past Surgical History:  Procedure Laterality Date  . BARIATRIC SURGERY    . ECTOPIC PREGNANCY SURGERY    . 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 Mitral valve prolapse Mother   . Osteoporosis Mother   . Memory loss Mother   . Colon cancer Father   . Alcoholism Father    Social History   Socioeconomic History  . Marital status: Married    Spouse name: Not on file  . Number of children: 1  . Years of education: college  . Highest education level: Bachelor's degree (e.g., BA, AB, BS)  Occupational History  . Occupation: Retired  Scientific laboratory technician  . Financial resource strain: Not on file  . Food insecurity:  Worry: Not on file    Inability: Not on file  . Transportation needs:    Medical: Not on file    Non-medical: Not on file  Tobacco Use  . Smoking status: Never Smoker  . Smokeless tobacco: Never Used  Substance and Sexual Activity  . Alcohol use: No  . Drug use: No  . Sexual activity: Never  Lifestyle  . Physical activity:    Days per week: Not on file    Minutes per session: Not on file  . Stress: Not on file  Relationships  . Social connections:    Talks on phone: Not on file    Gets together: Not on file    Attends religious service: Not on file    Active member of club or  organization: Not on file    Attends meetings of clubs or organizations: Not on file    Relationship status: Not on file  Other Topics Concern  . Not on file  Social History Narrative   Lives at home with husband.   Right-handed.   3 cups caffeine per day.    Outpatient Encounter Medications as of 08/04/2018  Medication Sig  . Acetaminophen (TYLENOL PO) Take by mouth as needed.  . Calcium-Vitamin D-Vitamin K [phone] MG-UNT-MCG TABS Take 1 tablet by mouth 2 (two) times daily. Also includes Vitamin C  . Cholecalciferol (SM VITAMIN D3) 4000 units CAPS Take 4,000 Units by mouth daily.  Marland Kitchen denosumab (PROLIA) 60 MG/ML SOLN injection Inject 60 mg into the skin every 6 (six) months. Administer in upper arm, thigh, or abdomen  . ferrous sulfate (FERROUSUL) 325 (65 FE) MG tablet Take 1 tablet (325 mg total) 3 (three) times daily with meals by mouth. (Patient taking differently: Take 325 mg by mouth daily with breakfast. )  . Glucosamine-Chondroit-Vit C-Mn (GLUCOSAMINE CHONDR 1500 COMPLX PO) Take 1 capsule by mouth daily.  Marland Kitchen levETIRAcetam (KEPPRA) 500 MG tablet One tab po qam and 2 tabs po qhs.  . multivitamin-lutein (OCUVITE-LUTEIN) CAPS capsule Take 1 capsule by mouth daily.  . Omega-3 Fatty Acids (OMEGA 3 PO) Take 1 capsule by mouth daily.  . vitamin E 600 UNIT capsule Take 600 Units by mouth daily.   No facility-administered encounter medications on file as of 08/04/2018.     Activities of Daily Living In your present state of health, do you have any difficulty performing the following activities: 09/07/2017 09/07/2017  Hearing? N N  Vision? N N  Comment - -  Difficulty concentrating or making decisions? N N  Walking or climbing stairs? Y Y  Dressing or bathing? N N  Doing errands, shopping? N -  Some recent data might be hidden    Patient Care Team: Briscoe Deutscher, DO as PCP - General (Family Medicine)    Assessment:   This is a routine wellness examination for  Summer [name].  Exercise Activities and Dietary recommendations    Goals   None     Fall Risk Fall Risk  05/20/2017  Falls in the past year? No     Depression Screen PHQ 2/9 Scores 05/20/2017  PHQ - 2 Score 0     Cognitive Function     Ad8 score reviewed for issues:  Issues making decisions:  Less interest in hobbies / activities:  Repeats questions, stories (family complaining):  Trouble using ordinary gadgets (microwave, computer, phone):  Forgets the month or year:   Mismanaging finances:   Remembering appts:  Daily problems with thinking and/or memory: Ad8 score is=  Immunization History  Administered Date(s) Administered  . H1N1 09/12/2008  . Influenza Split 06/28/2011, 10/29/2012  . Influenza, High Dose Seasonal PF 01/01/2015, 09/07/2016  . Influenza, Quadrivalent, Recombinant, Inj, Pf 01/01/2015, 09/07/2016  . Influenza,inj,Quad PF,6+ Mos 01/01/2015, 09/07/2016  . Influenza,trivalent, recombinat, inj, PF 06/28/2011, 10/29/2012  . Influenza-Unspecified 01/01/2015, 09/07/2016, 08/30/2017  . Pneumococcal Conjugate-13 10/01/2016  . Pneumococcal Polysaccharide-23 12/20/2013  . Tdap 01/27/2012, 09/08/2016    Screening Tests Health Maintenance  Topic Date Due  . COLONOSCOPY  09/21/2001  . INFLUENZA VACCINE  05/26/2018  . PNA vac Low Risk Adult (2 of 2 - PPSV23) 12/20/2018  . MAMMOGRAM  05/23/2020  . TETANUS/TDAP  09/08/2026  . DEXA SCAN  Completed  . Hepatitis C Screening  Completed         Plan:      PCP Notes ***  Health Maintenance ***  Abnormal Screens  ***  Referrals  ***  Patient concerns; ***  Nurse Concerns; ***  Next PCP apt ***   I have personally reviewed and noted the following in the patient's chart:   . Medical and social history . Use of alcohol, tobacco or illicit drugs  . Current medications and supplements . Functional ability and status . Nutritional status . Physical activity . Advanced  directives . List of other physicians . Hospitalizations, surgeries, and ER visits in previous 12 months . Vitals . Screenings to include cognitive, depression, and falls . Referrals and appointments  In addition, I have reviewed and discussed with patient certain preventive protocols, quality metrics, and best practice recommendations. A written personalized care plan for preventive services as well as general preventive health recommendations were provided to patient.     IRSWN,IOEVO, RN  08/03/2018

## 2018-08-04 ENCOUNTER — Ambulatory Visit: Payer: Medicare Other

## 2018-08-08 ENCOUNTER — Other Ambulatory Visit: Payer: Self-pay | Admitting: Family Medicine

## 2018-08-08 DIAGNOSIS — R569 Unspecified convulsions: Secondary | ICD-10-CM

## 2018-08-08 NOTE — Telephone Encounter (Signed)
This is being taken care of by Neurology.

## 2018-08-18 ENCOUNTER — Ambulatory Visit: Payer: Medicare Other

## 2018-09-07 NOTE — Progress Notes (Signed)
Subjective:   Summer [name] is a 67 y.o. female who presents for an Initial Medicare Annual Wellness Visit.  Reports health as good   Diet Nutritious diet  Currently engaging in the NOON program online   [facility] Silver sneakers or bike Knee replacement last nov Able to bend and play with the 67 yo and has 24 month old grandbaby  There are no preventive care reminders to display for this patient. Had high dose flu vaccine at the pharmacy  Mammogram 04/2018  Dexa 03/15/2017 - osteopenia in the hip  prolia x 2 per year 06/22/2018 was her last dose    Cardiac Risk Factors include: advanced age (>10men, >31 women)(with grands as well )colonoscopy 03/18/2017 Will discuss with Dr. [name] when to refer to GI for fup; as [place]. There is a hx of colon cancer in the family   Just treated for basal cell cancer above the left breast  Dermatology Dr. Merryl Hacker [facility] asso    Objective:    Today's Vitals   09/08/18 1521  BP: 126/80  Pulse: 61  SpO2: 98%  Weight: 222 lb (100.7 kg)  Height: 5\' 6"  (1.676 m)   Body mass index is 35.83 kg/m.  Advanced Directives 09/08/2018 11/04/2017 09/07/2017 09/07/2017 09/01/2017 06/28/2017 04/01/2017  Does Patient Have a Medical Advance Directive? Yes Yes Yes Yes Yes No No  Type of Advance Directive - [place] - -  Does patient want to make changes to medical advance directive? - - No - Patient declined No - Patient declined No - Patient declined - -  Copy of [place] in Chart? - Yes No - copy requested No - copy requested - - -    Current Medications (verified) Outpatient Encounter Medications as of 09/08/2018  Medication Sig  . Acetaminophen (TYLENOL PO) Take by mouth as needed.  . Calcium-Vitamin D-Vitamin K [phone]  MG-UNT-MCG TABS Take 1 tablet by mouth 2 (two) times daily. Also includes Vitamin C  . Cholecalciferol (SM VITAMIN D3) 4000 units CAPS Take 4,000 Units by mouth daily.  Marland Kitchen denosumab (PROLIA) 60 MG/ML SOLN injection Inject 60 mg into the skin every 6 (six) months. Administer in upper arm, thigh, or abdomen  . ferrous sulfate (FERROUSUL) 325 (65 FE) MG tablet Take 1 tablet (325 mg total) 3 (three) times daily with meals by mouth. (Patient taking differently: Take 325 mg by mouth daily with breakfast. )  . Glucosamine-Chondroit-Vit C-Mn (GLUCOSAMINE CHONDR 1500 COMPLX PO) Take 1 capsule by mouth daily.  Marland Kitchen lamoTRIgine (LAMICTAL) 100 MG tablet Take 100 mg by mouth daily.  Marland Kitchen levETIRAcetam (KEPPRA) 500 MG tablet One tab po qam and 2 tabs po qhs.  . multivitamin-lutein (OCUVITE-LUTEIN) CAPS capsule Take 1 capsule by mouth daily.  . Omega-3 Fatty Acids (OMEGA 3 PO) Take 1 capsule by mouth daily.  . vitamin E 600 UNIT capsule Take 600 Units by mouth daily.   No facility-administered encounter medications on file as of 09/08/2018.     Allergies (verified) Bee venom   History: Past Medical History:  Diagnosis Date  . Adenomatous polyp of sigmoid colon 03/18/2017   Overview:  Added automatically from request for surgery [phone]  . Alcoholism ([place])    01/18/18 - no use in 5 years  . Asymptomatic varicose veins 01/11/2017  . Iron  deficiency anemia 01/12/2017   Last Assessment & Plan:  Given script for ferrous sulfate and order for repeat cbc with diff and iron studies to be done in 1 month Will also refer pt back to [facility] for repeat colonoscopy and to dietician for nutritional counseling, per pt request  . Lentigines 01/11/2017  . Melanocytic nevi of left lower limb, including hip 01/11/2017  . Melanocytic nevi of left upper limb, including shoulder 01/11/2017  . Melanocytic nevi of right lower limb, including hip 01/11/2017  . Melanocytic nevi of right upper limb, including shoulder 01/11/2017  . Melanocytic  nevi of trunk 01/11/2017  . Obesity (BMI 30-39.9) 05/21/2017  . Osteoporosis   . Plantar fasciitis   . 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 Seizure ([place]) 01/25/2017   LAST SEIZURE- 2004   . Sleep apnea   . Unilateral primary osteoarthritis, left knee 12/11/2016   Past Surgical History:  Procedure Laterality Date  . BARIATRIC SURGERY    . ECTOPIC PREGNANCY SURGERY    . 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 Mitral valve prolapse Mother   . Osteoporosis Mother   . Memory loss Mother   . Colon cancer Father   . Alcoholism Father    Social History   Socioeconomic History  . Marital status: Married    Spouse name: Not on file  . Number of children: 1  . Years of education: college  . Highest education level: Bachelor's degree (e.g., BA, AB, BS)  Occupational History  . Occupation: Retired  Scientific laboratory technician  . Financial resource strain: Not on file  . Food insecurity:    Worry: Not on file    Inability: Not on file  . Transportation needs:    Medical: Not on file    Non-medical: Not on file  Tobacco Use  . Smoking status: Never Smoker  . Smokeless tobacco: Never Used  Substance and Sexual Activity  . Alcohol use: No  . Drug use: No  . Sexual activity: Never  Lifestyle  . Physical activity:    Days per week: Not on file    Minutes per session: Not on file  . Stress: Not on file  Relationships  . Social connections:    Talks on phone: Not on file    Gets together: Not on file    Attends religious service: Not on file    Active member of club or organization: Not on file    Attends meetings of clubs or organizations: Not on file    Relationship status: Not on file  Other Topics Concern  . Not on file  Social History Narrative   Lives at home with husband.     Right-handed.   3 cups caffeine per day.    Tobacco Counseling Counseling given: Not Answered   Clinical Intake:     Activities of Daily Living In your present state of health, do you have any difficulty performing the following activities: 09/08/2018  Hearing? N  Vision? N  Difficulty concentrating or making decisions? N  Walking or climbing stairs? N  Dressing or bathing? N  Doing errands, shopping? N  Preparing Food and eating ? N  Using the Toilet? N  In the past six months, have you accidently leaked urine? N  Do you have problems with  loss of bowel control? N  Managing your Medications? N  Managing your Finances? N  Housekeeping or managing your Housekeeping? N  Some recent data might be hidden     Immunizations and Health Maintenance Immunization History  Administered Date(s) Administered  . H1N1 09/12/2008  . Influenza Split 06/28/2011, 10/29/2012  . Influenza, High Dose Seasonal PF 01/01/2015, 09/07/2016, 08/02/2018  . Influenza, Quadrivalent, Recombinant, Inj, Pf 01/01/2015, 09/07/2016  . Influenza,inj,Quad PF,6+ Mos 01/01/2015, 09/07/2016  . Influenza,trivalent, recombinat, inj, PF 06/28/2011, 10/29/2012  . Influenza-Unspecified 01/01/2015, 09/07/2016, 08/30/2017  . Pneumococcal Conjugate-13 10/01/2016, 08/23/2018  . Pneumococcal Polysaccharide-23 12/20/2013  . Tdap 01/27/2012, 09/08/2016, 08/17/2018   There are no preventive care reminders to display for this patient.  Patient Care Team: Briscoe Deutscher, DO as PCP - General (Family Medicine) [facility] as Consulting Physician (Dermatology)  Indicate any recent Medical Services you may have received from other than [facility] providers in the past year (date may be approximate).     Assessment:   This is a routine wellness examination for [facility].  Hearing/Vision screen Hearing Screening Comments: No hearing issues Vision Screening Comments: Vision none noted  Dietary issues and  exercise activities discussed: Current Exercise Habits: Home exercise routine;Structured exercise class, Type of exercise: strength training/weights;Other - see comments, Time (Minutes): 60, Frequency (Times/Week): 3, Weekly Exercise (Minutes/Week): 180  Goals    . Patient Stated     To spend time with Grandchildren!!!      Depression Screen PHQ 2/9 Scores 09/08/2018 05/20/2017  PHQ - 2 Score 0 0    Negative, enjoying grand children  Fall Risk Fall Risk  09/08/2018 05/20/2017  Falls in the past year? 0 No      Cognitive Function: MMSE - Mini Mental State Exam 09/08/2018  Not completed: (No Data)        Screening Tests Health Maintenance  Topic Date Due  . PNA vac Low Risk Adult (2 of 2 - PPSV23) 08/24/2019  . MAMMOGRAM  05/23/2020  . COLONOSCOPY  03/19/2027  . TETANUS/TDAP  08/17/2028  . INFLUENZA VACCINE  Completed  . DEXA SCAN  Completed  . Hepatitis C Screening  Completed          Plan:      PCP Notes   Health Maintenance To note; brought in form from [facility] today with vaccines. She has had 3 tdap in 6 years but was concerned about grand babies She has had 2 prevnars and 2 high dose flu vaccines recorded. Educated regarding the need for PSV 23 2020 Also educated regarding shingrix. Would advise to check with the office prior to taking more vaccines with the exception of the shingrix  Had high dose flu vaccine at the pharmacy  Mammogram 04/2018  Dexa 03/15/2017 - osteopenia in the hip  prolia x 2 per year 06/22/2018 was her last dose     colonoscopy 03/18/2017 Will discuss with Dr. [name] when to refer to GI for fup; as [place] one [place]. There is a hx of colon cancer in the family   Just treated for basal cell cancer above the left breast  Dermatology Dr. Merryl Hacker [facility] asso   Abnormal Screens  none  Referrals  none  Patient concerns; Right shoulder is sore; will make apt with Dr. [name] to fup if this does  not resolve.  Nurse Concerns; As noted   Next PCP apt [facility]       I have personally reviewed and noted the following in  the patient's chart:   . Medical and social history . Use of alcohol, tobacco or illicit drugs  . Current medications and supplements . Functional ability and status . Nutritional status . Physical activity . Advanced directives . List of other physicians . Hospitalizations, surgeries, and ER visits in previous 12 months . Vitals . Screenings to include cognitive, depression, and falls . Referrals and appointments  In addition, I have reviewed and discussed with patient certain preventive protocols, quality metrics, and best practice recommendations. A written personalized care plan for preventive services as well as general preventive health recommendations were provided to patient.     Wynetta Fines, RN   09/08/2018

## 2018-09-08 ENCOUNTER — Ambulatory Visit (INDEPENDENT_AMBULATORY_CARE_PROVIDER_SITE_OTHER): Payer: Medicare Other

## 2018-09-08 VITALS — BP 126/80 | HR 61 | Ht 66.0 in | Wt 222.0 lb

## 2018-09-08 DIAGNOSIS — Z0000 Encounter for general adult medical examination without abnormal findings: Secondary | ICD-10-CM | POA: Diagnosis not present

## 2018-09-08 NOTE — Patient Instructions (Addendum)
Summer [name] , Thank you for taking time to come for your Medicare Wellness Visit. I appreciate your ongoing commitment to your health goals. Please review the following plan we discussed and let me know if I can assist you in the future.   Can discuss GI Referral timing with Dr. [name].   Keep using your NOON!  Shingrix is a vaccine for the prevention of Shingles in Adults 50 and older.  If you are on Medicare, the shingrix is covered under your Part D plan, so you will take both of the vaccines in the series at your pharmacy. Please check with your benefits regarding applicable copays or out of pocket expenses.  The Shingrix is given in 2 vaccines approx 8 weeks apart. You must receive the 2nd dose prior to  06/22/2018 was your last dose of Prolia  It is good to have vision check annually but last one was 2018   These are the goals we discussed: Goals    . Patient Stated     To spend time with Grandchildren!!!       This is a list of the screening recommended for you and due dates:  Health Maintenance  Topic Date Due  . Flu Shot  05/26/2018  . Pneumonia vaccines (2 of 2 - PPSV23) 12/20/2018  . Mammogram  05/23/2020  . Tetanus Vaccine  09/08/2026  . Colon Cancer Screening  03/19/2027  . DEXA scan (bone density measurement)  Completed  .  Hepatitis C: One time screening is recommended by Center for Disease Control  (CDC) for  adults born from [birth year] through 1965.   Completed      Fall Prevention in the Home Falls can cause injuries. They can happen to people of all ages. There are many things you can do to make your home safe and to help prevent falls. What can I do on the outside of my home?  Regularly fix the edges of walkways and driveways and fix any cracks.  Remove anything that might make you trip as you walk through a door, such as a raised step or threshold.  Trim any bushes or trees on the path to your home.  Use bright outdoor lighting.  Clear any walking  paths of anything that might make someone trip, such as rocks or tools.  Regularly check to see if handrails are loose or broken. Make sure that both sides of any steps have handrails.  Any raised decks and porches should have guardrails on the edges.  Have any leaves, snow, or ice cleared regularly.  Use sand or salt on walking paths during winter.  Clean up any spills in your garage right away. This includes oil or grease spills. What can I do in the bathroom?  Use night lights.  Install grab bars by the toilet and in the tub and shower. Do not use towel bars as grab bars.  Use non-skid mats or decals in the tub or shower.  If you need to sit down in the shower, use a plastic, non-slip stool.  Keep the floor dry. Clean up any water that spills on the floor as soon as it happens.  Remove soap buildup in the tub or shower regularly.  Attach bath mats securely with double-sided non-slip rug tape.  Do not have throw rugs and other things on the floor that can make you trip. What can I do in the bedroom?  Use night lights.  Make sure that you have a light by your  bed that is easy to reach.  Do not use any sheets or blankets that are too big for your bed. They should not hang down onto the floor.  Have a firm chair that has side arms. You can use this for support while you get dressed.  Do not have throw rugs and other things on the floor that can make you trip. What can I do in the kitchen?  Clean up any spills right away.  Avoid walking on wet floors.  Keep items that you use a lot in easy-to-reach places.  If you need to reach something above you, use a strong step stool that has a grab bar.  Keep electrical cords out of the way.  Do not use floor polish or wax that makes floors slippery. If you must use wax, use non-skid floor wax.  Do not have throw rugs and other things on the floor that can make you trip. What can I do with my stairs?  Do not leave any items  on the stairs.  Make sure that there are handrails on both sides of the stairs and use them. Fix handrails that are broken or loose. Make sure that handrails are as long as the stairways.  Check any carpeting to make sure that it is firmly attached to the stairs. Fix any carpet that is loose or worn.  Avoid having throw rugs at the top or bottom of the stairs. If you do have throw rugs, attach them to the floor with carpet tape.  Make sure that you have a light switch at the top of the stairs and the bottom of the stairs. If you do not have them, ask someone to add them for you. What else can I do to help prevent falls?  Wear shoes that: ? Do not have high heels. ? Have rubber bottoms. ? Are comfortable and fit you well. ? Are closed at the toe. Do not wear sandals.  If you use a stepladder: ? Make sure that it is fully opened. Do not climb a closed stepladder. ? Make sure that both sides of the stepladder are locked into place. ? Ask someone to hold it for you, if possible.  Clearly mark and make sure that you can see: ? Any grab bars or handrails. ? First and last steps. ? Where the edge of each step is.  Use tools that help you move around (mobility aids) if they are needed. These include: ? Canes. ? Walkers. ? Scooters. ? Crutches.  Turn on the lights when you go into a dark area. Replace any light bulbs as soon as they burn out.  Set up your furniture so you have a clear path. Avoid moving your furniture around.  If any of your floors are uneven, fix them.  If there are any pets around you, be aware of where they are.  Review your medicines with your doctor. Some medicines can make you feel dizzy. This can increase your chance of falling. Ask your doctor what other things that you can do to help prevent falls. This information is not intended to replace advice given to you by your health care provider. Make sure you discuss any questions you have with your health care  provider. Document Released: 08/08/2009 Document Revised: 03/19/2016 Document Reviewed: 11/16/2014 Elsevier Interactive Patient Education  2018 Bouse Maintenance, Female Adopting a healthy lifestyle and getting preventive care can go a long way to promote health and wellness. Talk with your  health care provider about what schedule of regular examinations is right for you. This is a good chance for you to check in with your provider about disease prevention and staying healthy. In between checkups, there are plenty of things you can do on your own. Experts have done a lot of research about which lifestyle changes and preventive measures are most likely to keep you healthy. Ask your health care provider for more information. Weight and diet Eat a healthy diet  Be sure to include plenty of vegetables, fruits, low-fat dairy products, and lean protein.  Do not eat a lot of foods high in solid fats, added sugars, or salt.  Get regular exercise. This is one of the most important things you can do for your health. ? Most adults should exercise for at least 150 minutes each week. The exercise should increase your heart rate and make you sweat (moderate-intensity exercise). ? Most adults should also do strengthening exercises at least twice a week. This is in addition to the moderate-intensity exercise.  Maintain a healthy weight  Body mass index (BMI) is a measurement that can be used to identify possible weight problems. It estimates body fat based on height and weight. Your health care provider can help determine your BMI and help you achieve or maintain a healthy weight.  For females 70 years of age and older: ? A BMI below 18.5 is considered underweight. ? A BMI of 18.5 to 24.9 is normal. ? A BMI of 25 to 29.9 is considered overweight. ? A BMI of 30 and above is considered obese.  Watch levels of cholesterol and blood lipids  You should start having your blood tested for  lipids and cholesterol at 67 years of age, then have this test every 5 years.  You may need to have your cholesterol levels checked more often if: ? Your lipid or cholesterol levels are high. ? You are older than 67 years of age. ? You are at high risk for heart disease.  Cancer screening Lung Cancer  Lung cancer screening is recommended for adults 60-71 years old who are at high risk for lung cancer because of a history of smoking.  A yearly low-dose CT scan of the lungs is recommended for people who: ? Currently smoke. ? Have quit within the past 15 years. ? Have at least a 30-pack-year history of smoking. A pack year is smoking an average of one pack of cigarettes a day for 1 year.  Yearly screening should continue until it has been 15 years since you quit.  Yearly screening should stop if you develop a health problem that would prevent you from having lung cancer treatment.  Breast Cancer  Practice breast self-awareness. This means understanding how your breasts normally appear and feel.  It also means doing regular breast self-exams. Let your health care provider know about any changes, no matter how small.  If you are in your 20s or 30s, you should have a clinical breast exam (CBE) by a health care provider every 1-3 years as part of a regular health exam.  If you are 56 or older, have a CBE every year. Also consider having a breast X-ray (mammogram) every year.  If you have a family history of breast cancer, talk to your health care provider about genetic screening.  If you are at high risk for breast cancer, talk to your health care provider about having an MRI and a mammogram every year.  Breast cancer gene (BRCA) assessment  is recommended for women who have family members with BRCA-related cancers. BRCA-related cancers include: ? Breast. ? Ovarian. ? Tubal. ? Peritoneal cancers.  Results of the assessment will determine the need for genetic counseling and BRCA1 and  BRCA2 testing.  Cervical Cancer Your health care provider may recommend that you be screened regularly for cancer of the pelvic organs (ovaries, uterus, and vagina). This screening involves a pelvic examination, including checking for microscopic changes to the surface of your cervix (Pap test). You may be encouraged to have this screening done every 3 years, beginning at age 46.  For women ages 72-65, health care providers may recommend pelvic exams and Pap testing every 3 years, or they may recommend the Pap and pelvic exam, combined with testing for human papilloma virus (HPV), every 5 years. Some types of HPV increase your risk of cervical cancer. Testing for HPV may also be done on women of any age with unclear Pap test results.  Other health care providers may not recommend any screening for nonpregnant women who are considered low risk for pelvic cancer and who do not have symptoms. Ask your health care provider if a screening pelvic exam is right for you.  If you have had past treatment for cervical cancer or a condition that could lead to cancer, you need Pap tests and screening for cancer for at least 20 years after your treatment. If Pap tests have been discontinued, your risk factors (such as having a new sexual partner) need to be reassessed to determine if screening should resume. Some women have medical problems that increase the chance of getting cervical cancer. In these cases, your health care provider may recommend more frequent screening and Pap tests.  Colorectal Cancer  This type of cancer can be detected and often prevented.  Routine colorectal cancer screening usually begins at 67 years of age and continues through 67 years of age.  Your health care provider may recommend screening at an earlier age if you have risk factors for colon cancer.  Your health care provider may also recommend using home test kits to check for hidden blood in the stool.  A small camera at the  end of a tube can be used to examine your colon directly (sigmoidoscopy or colonoscopy). This is done to check for the earliest forms of colorectal cancer.  Routine screening usually begins at age 57.  Direct examination of the colon should be repeated every 5-10 years through 67 years of age. However, you may need to be screened more often if early forms of precancerous polyps or small growths are found.  Skin Cancer  Check your skin from head to toe regularly.  Tell your health care provider about any new moles or changes in moles, especially if there is a change in a mole's shape or color.  Also tell your health care provider if you have a mole that is larger than the size of a pencil eraser.  Always use sunscreen. Apply sunscreen liberally and repeatedly throughout the day.  Protect yourself by wearing long sleeves, pants, a wide-brimmed hat, and sunglasses whenever you are outside.  Heart disease, diabetes, and high blood pressure  High blood pressure causes heart disease and increases the risk of stroke. High blood pressure is more likely to develop in: ? People who have blood pressure in the high end of the normal range (130-139/85-89 mm Hg). ? People who are overweight or obese. ? People who are African American.  If you are 18-39  years of age, have your blood pressure checked every 3-5 years. If you are 5 years of age or older, have your blood pressure checked every year. You should have your blood pressure measured twice-once when you are at a hospital or clinic, and once when you are not at a hospital or clinic. Record the average of the two measurements. To check your blood pressure when you are not at a hospital or clinic, you can use: ? An automated blood pressure machine at a pharmacy. ? A home blood pressure monitor.  If you are between 74 years and 61 years old, ask your health care provider if you should take aspirin to prevent strokes.  Have regular diabetes  screenings. This involves taking a blood sample to check your fasting blood sugar level. ? If you are at a normal weight and have a low risk for diabetes, have this test once every three years after 67 years of age. ? If you are overweight and have a high risk for diabetes, consider being tested at a younger age or more often. Preventing infection Hepatitis B  If you have a higher risk for hepatitis B, you should be screened for this virus. You are considered at high risk for hepatitis B if: ? You were born in a country where hepatitis B is common. Ask your health care provider which countries are considered high risk. ? Your parents were born in a high-risk country, and you have not been immunized against hepatitis B (hepatitis B vaccine). ? You have HIV or AIDS. ? You use needles to inject street drugs. ? You live with someone who has hepatitis B. ? You have had sex with someone who has hepatitis B. ? You get hemodialysis treatment. ? You take certain medicines for conditions, including cancer, organ transplantation, and autoimmune conditions.  Hepatitis C  Blood testing is recommended for: ? Everyone born from [birth year] through 1965. ? Anyone with known risk factors for hepatitis C.  Sexually transmitted infections (STIs)  You should be screened for sexually transmitted infections (STIs) including gonorrhea and chlamydia if: ? You are sexually active and are younger than 67 years of age. ? You are older than 67 years of age and your health care provider tells you that you are at risk for this type of infection. ? Your sexual activity has changed since you were last screened and you are at an increased risk for chlamydia or gonorrhea. Ask your health care provider if you are at risk.  If you do not have HIV, but are at risk, it may be recommended that you take a prescription medicine daily to prevent HIV infection. This is called pre-exposure prophylaxis (PrEP). You are considered at risk  if: ? You are sexually active and do not regularly use condoms or know the HIV status of your partner(s). ? You take drugs by injection. ? You are sexually active with a partner who has HIV.  Talk with your health care provider about whether you are at high risk of being infected with HIV. If you choose to begin PrEP, you should first be tested for HIV. You should then be tested every 3 months for as long as you are taking PrEP. Pregnancy  If you are premenopausal and you may become pregnant, ask your health care provider about preconception counseling.  If you may become pregnant, take 400 to 800 micrograms (mcg) of folic acid every day.  If you want to prevent pregnancy, talk to your health care  provider about birth control (contraception). Osteoporosis and menopause  Osteoporosis is a disease in which the bones lose minerals and strength with aging. This can result in serious bone fractures. Your risk for osteoporosis can be identified using a bone density scan.  If you are 53 years of age or older, or if you are at risk for osteoporosis and fractures, ask your health care provider if you should be screened.  Ask your health care provider whether you should take a calcium or vitamin D supplement to lower your risk for osteoporosis.  Menopause may have certain physical symptoms and risks.  Hormone replacement therapy may reduce some of these symptoms and risks. Talk to your health care provider about whether hormone replacement therapy is right for you. Follow these instructions at home:  Schedule regular health, dental, and eye exams.  Stay current with your immunizations.  Do not use any tobacco products including cigarettes, chewing tobacco, or electronic cigarettes.  If you are pregnant, do not drink alcohol.  If you are breastfeeding, limit how much and how often you drink alcohol.  Limit alcohol intake to no more than 1 drink per day for nonpregnant women. One drink  equals 12 ounces of beer, 5 ounces of wine, or 1 ounces of hard liquor.  Do not use street drugs.  Do not share needles.  Ask your health care provider for help if you need support or information about quitting drugs.  Tell your health care provider if you often feel depressed.  Tell your health care provider if you have ever been abused or do not feel safe at home. This information is not intended to replace advice given to you by your health care provider. Make sure you discuss any questions you have with your health care provider. Document Released: 04/27/2011 Document Revised: 03/19/2016 Document Reviewed: 07/16/2015 Elsevier Interactive Patient Education  [employer].

## 2018-09-08 NOTE — Progress Notes (Signed)
I have reviewed and agree with note, evaluation, plan.   I appreciate the advise for her to run future immunizations by Dr. [name] and advise to follow up with Dr. [name] if shoulder doesn't improve further.   Garret Reddish, MD

## 2018-09-21 ENCOUNTER — Telehealth: Payer: Medicare Other | Admitting: Family

## 2018-09-21 DIAGNOSIS — H9202 Otalgia, left ear: Secondary | ICD-10-CM

## 2018-09-21 MED ORDER — NEOMYCIN-POLYMYXIN-HC 3.5-10000-1 OT SOLN: 4.0000 [drp] | Freq: Four times a day (QID) | OTIC | 0 refills | Status: DC

## 2018-09-21 NOTE — Addendum Note (Signed)
Addended by: Dutch Quint B on: 09/21/2018 08:52 PM   Modules accepted: Orders

## 2018-09-21 NOTE — Progress Notes (Signed)
E Visit for Swimmer's Ear  We are sorry that you are not feeling well. Here is how we plan to help!  I have prescribed: Neomycin 0.35%, polymyxin B 10,000 units/mL, and hydrocortisone 0,5% otic solution 4 drops in affected ears four times a day until completed    In certain cases swimmer's ear may progress to a more serious bacterial infection of the middle or inner ear.  If you have a fever 102 and up and significantly worsening symptoms, this could indicate a more serious infection moving to the middle/inner and needs face to face evaluation in an office by a provider.  Your symptoms should improve over the next 3 days and should resolve in about 7 days.  HOME CARE:   Wash your hands frequently.  Do not place the tip of the bottle on your ear or touch it with your fingers.  You can take Acetominophen 650 mg every 4-6 hours as needed for pain.  If pain is severe or moderate, you can apply a heating pad (set on low) or hot water bottle (wrapped in a towel) to outer ear for 20 minutes.  This will also increase drainage.  Avoid ear plugs  Do not use Q-tips  After showers, help the water run out by tilting your head to one side.  GET HELP RIGHT AWAY IF:   Fever is over 102.2 degrees.  You develop progressive ear pain or hearing loss.  Ear symptoms persist longer than 3 days after treatment.  MAKE SURE YOU:   Understand these instructions.  Will watch your condition.  Will get help right away if you are not doing well or get worse.  TO PREVENT SWIMMER'S EAR:  Use a bathing cap or custom fitted swim molds to keep your ears dry.  Towel off after swimming to dry your ears.  Tilt your head or pull your earlobes to allow the water to escape your ear canal.  If there is still water in your ears, consider using a hairdryer on the lowest setting.  Thank you for choosing an e-visit. Your e-visit answers were reviewed by a board certified advanced clinical practitioner to  complete your personal care plan. Depending upon the condition, your plan could have included both over the counter or prescription medications. Please review your pharmacy choice. Be sure that the pharmacy you have chosen is open so that you can pick up your prescription now.  If there is a problem you may message your provider in MyChart to have the prescription routed to another pharmacy. Your safety is important to us. If you have drug allergies check your prescription carefully.  For the next 24 hours, you can use MyChart to ask questions about today's visit, request a non-urgent call back, or ask for a work or school excuse from your e-visit provider. You will get an email in the next two days asking about your experience. I hope that your e-visit has been valuable and will speed your recovery.      

## 2018-09-23 ENCOUNTER — Ambulatory Visit: Payer: Medicare Other | Admitting: Family

## 2018-09-23 ENCOUNTER — Encounter: Payer: Self-pay | Admitting: Family

## 2018-09-23 VITALS — BP 124/70 | HR 68 | Temp 97.6°F | Ht 66.0 in | Wt 222.0 lb

## 2018-09-23 DIAGNOSIS — H6692 Otitis media, unspecified, left ear: Secondary | ICD-10-CM

## 2018-09-23 DIAGNOSIS — H6093 Unspecified otitis externa, bilateral: Secondary | ICD-10-CM | POA: Diagnosis not present

## 2018-09-23 MED ORDER — AMOXICILLIN 500 MG PO CAPS: 500.0000 mg | ORAL_CAPSULE | Freq: Three times a day (TID) | ORAL | 0 refills | Status: DC

## 2018-09-23 NOTE — Progress Notes (Signed)
Subjective:    Patient ID: Summer [name], female    DOB: 07/27/1951, 67 y.o.   MRN: [id number]  HPI  Patient is a 67yr old female who presents today with chief complaint of bilateral ear pain. She did an e-visit on 09/21/18. She was treated with cortisporin otic solution.   Reports that she had some lower tooth pain. Then left ear felt clogged.  Started drops on Wednesday night. R ear became clogged and a bit sore.  Reports that her ears are not as sore.    Tooth pain is improved.   Review of Systems See HPI  Past Medical History:  Diagnosis Date  . Adenomatous polyp of sigmoid colon 03/18/2017   Overview:  Added automatically from request for surgery [phone]  . Alcoholism ([place])    01/18/18 - no use in 5 years  . Asymptomatic varicose veins 01/11/2017  . Iron deficiency anemia 01/12/2017   Last Assessment & Plan:  Given script for ferrous sulfate and order for repeat cbc with diff and iron studies to be done in 1 month Will also refer pt back to [facility] for repeat colonoscopy and to dietician for nutritional counseling, per pt request  . Lentigines 01/11/2017  . Melanocytic nevi of left lower limb, including hip 01/11/2017  . Melanocytic nevi of left upper limb, including shoulder 01/11/2017  . Melanocytic nevi of right lower limb, including hip 01/11/2017  . Melanocytic nevi of right upper limb, including shoulder 01/11/2017  . Melanocytic nevi of trunk 01/11/2017  . Obesity (BMI 30-39.9) 05/21/2017  . Osteoporosis   . Plantar fasciitis   . 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 Seizure ([place]) 01/25/2017   LAST SEIZURE- 2004   . Sleep apnea   . Unilateral primary osteoarthritis, left knee 12/11/2016     Social History   Socioeconomic History  . Marital status: Married    Spouse name: Not on file  . Number of children: 1  . Years of education: college  . Highest education level: Bachelor's degree (e.g., BA, AB, BS)  Occupational History  . Occupation: Retired  Photographer  . Financial resource strain: Not on file  . Food insecurity:    Worry: Not on file    Inability: Not on file  . Transportation needs:    Medical: Not on file    Non-medical: Not on file  Tobacco Use  . Smoking status: Never Smoker  . Smokeless tobacco: Never Used  Substance and Sexual Activity  . Alcohol use: No  . Drug use: No  . Sexual activity: Never  Lifestyle  . Physical activity:    Days per week: Not on file    Minutes per session: Not on file  . Stress: Not on file  Relationships  . Social connections:    Talks on phone: Not on file    Gets together: Not on file    Attends religious service: Not on file    Active member of club or organization: Not on file    Attends meetings of clubs or organizations: Not on file    Relationship status: Not on file  . Intimate partner violence:    Fear of current or ex partner: Not on file    Emotionally abused: Not on file    Physically abused: Not on file    Forced sexual activity: Not on file  Other Topics Concern  . Not on file  Social History Narrative   Lives at home with husband.  Right-handed.   3 cups caffeine per day.    Past Surgical History:  Procedure Laterality Date  . BARIATRIC SURGERY    . ECTOPIC PREGNANCY SURGERY    . 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 Mitral valve prolapse Mother   . Osteoporosis Mother   . Memory loss Mother   . Colon cancer Father   . Alcoholism Father     Allergies  Allergen Reactions  . Bee Venom Swelling    Current Outpatient Medications on File Prior to Visit  Medication Sig Dispense Refill  . Acetaminophen (TYLENOL PO) Take by mouth as needed.    . Calcium-Vitamin D-Vitamin K [id number] MG-UNT-MCG TABS Take 1 tablet by mouth 2 (two) times daily. Also  includes Vitamin C    . Cholecalciferol (SM VITAMIN D3) 4000 units CAPS Take 4,000 Units by mouth daily.    Marland Kitchen denosumab (PROLIA) 60 MG/ML SOLN injection Inject 60 mg into the skin every 6 (six) months. Administer in upper arm, thigh, or abdomen    . ferrous sulfate (FERROUSUL) 325 (65 FE) MG tablet Take 1 tablet (325 mg total) 3 (three) times daily with meals by mouth. (Patient taking differently: Take 325 mg by mouth daily with breakfast. )  3  . Glucosamine-Chondroit-Vit C-Mn (GLUCOSAMINE CHONDR 1500 COMPLX PO) Take 1 capsule by mouth daily.    Marland Kitchen lamoTRIgine (LAMICTAL) 150 MG tablet Take 1 tablet by mouth 2 (two) times daily.    Marland Kitchen levETIRAcetam (KEPPRA) 500 MG tablet One tab po qam and 2 tabs po qhs. 270 tablet 4  . multivitamin-lutein (OCUVITE-LUTEIN) CAPS capsule Take 1 capsule by mouth daily.    Marland Kitchen neomycin-polymyxin-hydrocortisone (CORTISPORIN) OTIC solution Place 4 drops into the left ear 4 (four) times daily. 10 mL 0  . Omega-3 Fatty Acids (OMEGA 3 PO) Take 1 capsule by mouth daily.    . vitamin E 600 UNIT capsule Take 600 Units by mouth daily.     No current facility-administered medications on file prior to visit.     BP 124/70   Pulse 68   Temp 97.6 F (36.4 C) (Oral)   Ht 5\' 6"  (1.676 m)   Wt 222 lb (100.7 kg)   SpO2 99%   BMI 35.83 kg/m       Objective:   Physical Exam  Constitutional: She appears well-developed and well-nourished.  HENT:  Head: Normocephalic and atraumatic.  Mouth/Throat: No oropharyngeal exudate, posterior oropharyngeal edema or posterior oropharyngeal erythema.  L TM is erythematous, retracted, mild erythema of ear canal.  R TM appears retracted without erythema Right ear canal is moderately erthematous   Cardiovascular: Normal rate, regular rhythm and normal heart sounds.  No murmur heard. Pulmonary/Chest: Effort normal and breath sounds normal. No respiratory distress. She has no wheezes.  Psychiatric: She has a normal mood and affect. Her  behavior is normal. Judgment and thought content normal.          Assessment & Plan:  Left otitis media- start amoxicillin.    Bilateral Otitis Externa- continue cortisporin otic drops.  Pt is advised to call if symptoms worsen or if symptoms are not improved in 3-4 days.

## 2018-09-23 NOTE — Patient Instructions (Signed)
Continue cortisporin otic drops.   Start amoxicillin 3x daily for 10 days for left ear infection. Call if symptoms worsen or if not improved in 3 days.

## 2018-10-06 ENCOUNTER — Ambulatory Visit: Payer: Medicare Other | Admitting: Family Medicine

## 2018-10-06 ENCOUNTER — Encounter: Payer: Self-pay | Admitting: Family Medicine

## 2018-10-06 VITALS — BP 126/74 | HR 74 | Temp 98.6°F | Ht 66.0 in | Wt 223.2 lb

## 2018-10-06 DIAGNOSIS — H6983 Other specified disorders of Eustachian tube, bilateral: Secondary | ICD-10-CM | POA: Diagnosis not present

## 2018-10-06 DIAGNOSIS — H6093 Unspecified otitis externa, bilateral: Secondary | ICD-10-CM

## 2018-10-06 DIAGNOSIS — L84 Corns and callosities: Secondary | ICD-10-CM | POA: Diagnosis not present

## 2018-10-06 NOTE — Progress Notes (Signed)
   Subjective:  Summer Summer [name] is a 67 y.o. female who presents today for same-day appointment with a chief complaint of foot Sore.   HPI:  Foot Sore, Acute problem Symptoms started 1 to 2 weeks ago.  Located on bottom of left foot.  Patient notes that she had a callus to the area and has been using a manual pedicure file to the area.  Over the last 1 to 2 weeks has had some soreness to the area.  No redness.  No drainage.  No bleeding.  She is tried putting some topical antibiotic ointment to the area which has not helped.  Summer [name] is worse with walking.  No other obvious alleviating or aggravating factors.  Bilateral ear Summer [name] Patient was diagnosed with bilateral ear infection 2 weeks ago and was started on a course of oral antibiotics and topical drops.  Symptoms have improved however she still has some irritation in her ear canals bilaterally.  Hearing is normal.  ROS: Per HPI  PMH: She reports that she has never smoked. She has never used smokeless tobacco. She reports that she does not drink alcohol or use drugs.  Objective:  Physical Exam: BP 126/74   Pulse 74   Temp 98.6 F (37 C) (Oral)   Ht 5\' 6"  (1.676 m)   Wt 223 lb 3.2 oz (101.2 kg)   SpO2 97%   BMI 36.03 kg/m   Gen: NAD, resting comfortably HEENT: Bilateral EAC with mild erythema and irritation.  No purulence or drainage.  TMs with clear effusion however no erythema. MSK -Left foot: Callus on volar aspect fifth MTP joint.  No surrounding erythema.  This area is tender to palpation.  No drainage.  No fluctuance.  -Feet: Bilateral first MTP bunions noted.  Assessment/Plan:  Foot callus No red flags.  No signs of infection.  Likely has some metatarsalgia and local irritation to the area due to her bunion and also transverse arch.  She has custom orthotics in place.  Recommended padding the area of callus with an over-the-counter callus pad.  She will follow-up with her podiatrist to see if she needs to have any  adjustments made to her custom orthotic.  Discussed reasons return to care.  Otitis externa She will continue using her Cortisporin drops.  She has some eustachian tube dysfunction as well.  Recommend she use over-the-counter antihistamine such as Claritin/Zyrtec in addition of intranasal steroid such as fluticasone.  Discussed reasons to return to care.  Follow-up as needed.  Summer Summer [name]. Summer Pain, MD 10/06/2018 12:10 PM

## 2018-10-06 NOTE — Patient Instructions (Signed)
It was very nice to see you today!  You have an irritated callus. There are no signs of infection. Please try callus pads. See your podiatrist soon to see if your inserts need to be adjusted.  Please continue using your ear drops.  You can use Debrox to help prevent wax buildup.  Please try using an oral antihistamine such as Claritin, Zyrtec, or Allegra help with the pressure behind your ears.  You can also use a over-the-counter nasal spray such as Flonase or Nasacort.  Please let me know or let Dr. [name] know if your symptoms worsen or do not improve in the next several days.  Take care, Dr Jerline Pain

## 2018-12-07 ENCOUNTER — Telehealth: Payer: Self-pay

## 2018-12-07 NOTE — Telephone Encounter (Signed)
Called patient to schedule Prolia injection. Left a voicemail message asking for a return phone call

## 2018-12-08 ENCOUNTER — Other Ambulatory Visit: Payer: Self-pay | Admitting: Family Medicine

## 2019-01-30 ENCOUNTER — Other Ambulatory Visit: Payer: Self-pay | Admitting: Family Medicine

## 2019-01-30 NOTE — Telephone Encounter (Signed)
Scheduled virtual visit for tomorrow at 10:00 AM. Refill pending in visit.

## 2019-01-30 NOTE — Progress Notes (Signed)
Virtual Visit via Video   I connected with Summer [name] on 01/31/19 at 10:00 AM EDT by a video enabled telemedicine application and verified that I am speaking with the correct person using two identifiers. Location patient: Home Location provider: [facility], Office Persons participating in the virtual visit: KHAMILA BASSINGER, Briscoe Deutscher, DO Lonell Grandchild, CMA acting as scribe for Dr. Briscoe Deutscher.   I discussed the limitations of evaluation and management by telemedicine and the availability of in person appointments. The patient expressed understanding and agreed to proceed.  Subjective:   HPI:   Left leg swelling: more at the end of day. She had knee replacement in 2013. She noticed shortly after that. Wants to know what options she has to help with that. The swelling is normally above and to side of knee. She has noticed that she has had some below knee hat has started recently.   Reviewed all precautions and expectations with prevention of Covid-19. She is staying home as much as she can. Only leaving to get groceries. They are still doing the [employer].    ROS: See pertinent positives and negatives per HPI.  Patient Active Problem List   Diagnosis Date Noted  . Partial symptomatic epilepsy with complex partial seizures, not intractable, without status epilepticus ([place]) 01/18/2018  . Facial paresthesia 01/18/2018  . S/P total knee replacement 09/07/2017  . Obesity (BMI 30-39.9) 05/21/2017  . Anemia 05/21/2017  . Adenomatous polyp of sigmoid colon 03/18/2017  . Seizure ([place]) 01/25/2017  . Iron deficiency anemia 01/12/2017  . Neck pain 01/11/2017  . Osteoporosis 01/11/2017  . Lentigines 01/11/2017  . Unilateral primary osteoarthritis, left knee 12/11/2016  . Primary osteoarthritis, right shoulder 12/11/2016    Social History   Tobacco Use  . Smoking status: Never Smoker  . Smokeless tobacco: Never Used  Substance Use Topics  . Alcohol use:  No    Current Outpatient Medications:  .  Acetaminophen (TYLENOL PO), Take by mouth as needed., Disp: , Rfl:  .  Calcium-Vitamin D-Vitamin K [id number] MG-UNT-MCG TABS, Take 1 tablet by mouth 2 (two) times daily. Also includes Vitamin C, Disp: , Rfl:  .  Cholecalciferol (SM VITAMIN D3) 4000 units CAPS, Take 4,000 Units by mouth daily., Disp: , Rfl:  .  denosumab (PROLIA) 60 MG/ML SOLN injection, Inject 60 mg into the skin every 6 (six) months. Administer in upper arm, thigh, or abdomen, Disp: , Rfl:  .  ferrous sulfate (FERROUSUL) 325 (65 FE) MG tablet, Take 1 tablet (325 mg total) 3 (three) times daily with meals by mouth. (Patient taking differently: Take 325 mg by mouth daily with breakfast. ), Disp: , Rfl: 3 .  Glucosamine-Chondroit-Vit C-Mn (GLUCOSAMINE CHONDR 1500 COMPLX PO), Take 1 capsule by mouth daily., Disp: , Rfl:  .  levETIRAcetam (KEPPRA) 500 MG tablet, One tab po qam and 2 tabs po qhs. (Patient taking differently: 1 tab po qam and 1 tabs po qhs.), Disp: 270 tablet, Rfl: 4 .  Omega-3 Fatty Acids (OMEGA 3 PO), Take 1 capsule by mouth daily., Disp: , Rfl:  .  vitamin E 600 UNIT capsule, Take 600 Units by mouth daily., Disp: , Rfl:  .  lamoTRIgine (LAMICTAL) 150 MG tablet, Take 1 tablet (150 mg total) by mouth 2 (two) times daily., Disp: 180 tablet, Rfl: 1  Allergies  Allergen Reactions  . Bee Venom Swelling    Objective:   VITALS: Per patient if applicable, see vitals. GENERAL: Alert, appears well and  in no acute distress. HEENT: Atraumatic, conjunctiva clear, no obvious abnormalities on inspection of external nose and ears. NECK: Normal movements of the head and neck. CARDIOPULMONARY: No increased WOB. Speaking in clear sentences. I:E ratio WNL.  MS: Moves all visible extremities without noticeable abnormality. PSYCH: Pleasant and cooperative, well-groomed. Speech normal rate and rhythm. Affect is appropriate. Insight and judgement are appropriate. Attention is focused,  linear, and appropriate.  NEURO: CN grossly intact. Oriented as arrived to appointment on time with no prompting. Moves both UE equally.  SKIN: No obvious lesions, wounds, erythema, or cyanosis noted on face or hands.  Assessment and Plan:   Marene was seen today for follow-up.  Diagnoses and all orders for this visit:  Partial symptomatic epilepsy with complex partial seizures, not intractable, without status epilepticus ([facility]) -     lamoTRIgine (LAMICTAL) 150 MG tablet; Take 1 tablet (150 mg total) by mouth 2 (two) times daily.  Status post total knee replacement, unspecified laterality Comments: Discussed exercise and precautions.   Iron deficiency anemia, unspecified iron deficiency anemia type Comments: Continuing iron supplements.     . Reviewed expectations re: course of current medical issues. . Discussed self-management of symptoms. . Outlined signs and symptoms indicating need for more acute intervention. . Patient verbalized understanding and all questions were answered. Marland Kitchen Health Maintenance issues including appropriate healthy diet, exercise, and smoking avoidance were discussed with patient. . See orders for this visit as documented in the electronic medical record.  Briscoe Deutscher, DO 01/31/2019

## 2019-01-30 NOTE — Telephone Encounter (Signed)
Last OV 04/13/2018 Last refill 12/08/2018 #180/0 Next OV not scheduled

## 2019-01-31 ENCOUNTER — Encounter: Payer: Self-pay | Admitting: Family Medicine

## 2019-01-31 ENCOUNTER — Other Ambulatory Visit: Payer: Self-pay

## 2019-01-31 ENCOUNTER — Ambulatory Visit (INDEPENDENT_AMBULATORY_CARE_PROVIDER_SITE_OTHER): Payer: Medicare Other | Admitting: Family Medicine

## 2019-01-31 DIAGNOSIS — D509 Iron deficiency anemia, unspecified: Secondary | ICD-10-CM | POA: Diagnosis not present

## 2019-01-31 DIAGNOSIS — G40209 Localization-related (focal) (partial) symptomatic epilepsy and epileptic syndromes with complex partial seizures, not intractable, without status epilepticus: Secondary | ICD-10-CM | POA: Diagnosis not present

## 2019-01-31 DIAGNOSIS — Z96659 Presence of unspecified artificial knee joint: Secondary | ICD-10-CM | POA: Diagnosis not present

## 2019-01-31 MED ORDER — LAMOTRIGINE 150 MG PO TABS: 150.0000 mg | ORAL_TABLET | Freq: Two times a day (BID) | ORAL | 1 refills | Status: DC

## 2019-01-31 NOTE — Patient Instructions (Addendum)
[email or web address]  Increase daily walks to help with knee

## 2019-02-08 ENCOUNTER — Encounter: Payer: Self-pay | Admitting: Family Medicine

## 2019-03-06 ENCOUNTER — Telehealth: Payer: Self-pay | Admitting: Neurology

## 2019-03-06 NOTE — Telephone Encounter (Signed)
I tried to call the patient to reschedule her appointment to a sooner date. I left a message. If she calls back, try to move her appointment to the week of 03/06/2019.

## 2019-03-13 ENCOUNTER — Telehealth: Payer: Self-pay

## 2019-03-13 NOTE — Telephone Encounter (Signed)
Unable to get in contact with the patient to r/s her appt and to convert it into a doxy.me visit with Judson Roch due to her being out of the office 5/25-5/29. I left a voicemail asking the patient to call me back. Office number was provided.  If patient calls back please r/s her appt and covert it into a doxy.me visit with Judson Roch.

## 2019-03-16 NOTE — Telephone Encounter (Signed)
Spoke with the patient and we were able to r/s her office visit with Judson Roch and convert it into doxy.me visit. E-mail has been confirmed. Patient has given verbal consent to file her insurance and to do a doxy.me visit.   E-mail: [email or web address]

## 2019-03-21 ENCOUNTER — Ambulatory Visit: Payer: Medicare Other | Admitting: Adult Health

## 2019-03-21 ENCOUNTER — Ambulatory Visit: Payer: Medicare Other | Admitting: Neurology

## 2019-03-26 NOTE — Progress Notes (Deleted)
Virtual Visit via Video Note  I connected with Summer [name] on 03/26/19 at  2:45 PM EDT by a video enabled telemedicine application and verified that I am speaking with the correct person using two identifiers.  Location: Patient: *** Provider: ***   I discussed the limitations of evaluation and management by telemedicine and the availability of in person appointments. The patient expressed understanding and agreed to proceed.  History of Present Illness: Summer [name] is a 68 year old female, seen in refer by her primary care doctor Briscoe Deutscher, for evaluation of seizure, facial paresthesia, initial evaluation was on January 18, 2018.  She had a history of osteoporosis, is taking Prolia 60 mg every 6 months  History of complex partial seizure with secondary generalization  I reviewed previous neurology evaluation from [facility] clinic by Dr.Jehi  First seizure was in the labor room at age 80 after delivery, no warning signs,  Around 2015, she started binge drink for 3-4 years, she had recurrent seizure in February 2015, patient was amnestic of the event, husband got a call from her primary care physician the patient had a seizure during her routine appointment, with tongue biting, was admitted to [facility],    CT, MRI of the brain with and without contrast showed mild supratentorium small vessel disease, EEG showed no acute abnormality, no antiepileptic medication was given, record mentions that was thought to be due to alcohol withdrawal, patient stated that she was drinking heavily, hard liquor on a daily basis, but did stop drinking for weeks prior to the event in February 2015.  Second event July 2015, was at a work picnic, then woke up on the hospital, was witnessed by a physician there that she had a seizure, loss of consciousness with tonic-clonic activity of the extremities that lasted 5 minutes, followed by 30-60 minutes post ictal confusion,  Keppra was started at 750 twice a day, later was increased to 1000 mg twice a day later, patient complains of feeling sleepy,  Third event was in [place], she was witnessed by her friend that she has transient episode of confusion,  Her medication was later changed to lamotrigine 150 mg twice a day, Keppra 500 mg twice daily, stayed on that regimen since, she is tolerating the medication well, there was no recurrent seizures.   November 13, 2014 [facility] clinic epilepsy center multihour EEG showed intermittent bilateral independent temporal slowing., support a diagnosis of bilateral temporal cortical dysfunction, no epileptiform discharge or seizure activity noted.  Today she also complains of more than 3 years history of constant right facial paresthesia, burning hypersensitivity, mild asymmetry of the face, she noticed more wrinkles in her left lower face while woke up in the morning time.  Laboratory evaluation in January 2019 showed mild elevated LDL 101, negative troponin, INR 1.01, d-dimer was mildly elevated at 0.95, negative troponin, normal BMP, CBC   UPDATE Sept 5 2019: She is doing very well, had no recurrent seizure, has quit drinking. Now taking keppra 500mg  bid and lamotrigine 150mg  bid.   We personally reviewed MRI brain w/wo in May 2019, there are chronic small vessel disease, no acute abnormalities.  EEG was normal in June 2019  Update March 27, 9455 SS: 68 year old female with history of complex partial seizures, last seizure was in 2016, at last visit she was tapered off lamotrigine, increase Keppra to 500 mg in the morning, 1000 mg at bedtime   Observations/Objective:   Assessment and Plan: 1. Complex partial  seizure with secondary generalization   Last seizure was in 2016, regimen simplified to Keppra 500 mg in the morning, 1000 mg at bedtime.   Follow Up Instructions:    I discussed the assessment and treatment plan with the patient. The patient was  provided an opportunity to ask questions and all were answered. The patient agreed with the plan and demonstrated an understanding of the instructions.   The patient was advised to call back or seek an in-person evaluation if the symptoms worsen or if the condition fails to improve as anticipated.  I provided *** minutes of non-face-to-face time during this encounter.   Suzzanne Cloud, NP

## 2019-03-27 ENCOUNTER — Other Ambulatory Visit: Payer: Self-pay

## 2019-03-27 ENCOUNTER — Encounter: Payer: Self-pay | Admitting: Neurology

## 2019-03-27 ENCOUNTER — Ambulatory Visit: Payer: Medicare Other | Admitting: Neurology

## 2019-05-01 ENCOUNTER — Ambulatory Visit (INDEPENDENT_AMBULATORY_CARE_PROVIDER_SITE_OTHER): Payer: Medicare Other | Admitting: Physician Assistant

## 2019-05-01 ENCOUNTER — Encounter: Payer: Self-pay | Admitting: Physician Assistant

## 2019-05-01 ENCOUNTER — Other Ambulatory Visit: Payer: Self-pay | Admitting: Family Medicine

## 2019-05-01 ENCOUNTER — Other Ambulatory Visit: Payer: Self-pay | Admitting: *Deleted

## 2019-05-01 ENCOUNTER — Other Ambulatory Visit: Payer: Self-pay

## 2019-05-01 ENCOUNTER — Encounter: Payer: Self-pay | Admitting: Family Medicine

## 2019-05-01 ENCOUNTER — Telehealth: Payer: Self-pay | Admitting: Physician Assistant

## 2019-05-01 DIAGNOSIS — Z1231 Encounter for screening mammogram for malignant neoplasm of breast: Secondary | ICD-10-CM

## 2019-05-01 DIAGNOSIS — H539 Unspecified visual disturbance: Secondary | ICD-10-CM | POA: Diagnosis not present

## 2019-05-01 MED ORDER — EPINEPHRINE 0.3 MG/0.3ML IJ SOAJ: 0.3000 mg | INTRAMUSCULAR | 1 refills | Status: AC | PRN

## 2019-05-01 NOTE — Telephone Encounter (Signed)
Patient is scheduled to discuss left eye irritation today at 9:40am. She will possibly be unavailable for the call 15-20 min before her appointment time, patient has requested a call prior to the normal screening call.

## 2019-05-01 NOTE — Progress Notes (Signed)
Summer [name] is a 68 y.o. female here for a new problem.  History of Present Illness:   Chief Complaint  Patient presents with  . Eye Problem    HPI  Eye problem Pt c/o left eye irritation, since Thursday. Pt said there is a black spot in her left eye. She was moving boxes with her son and thinks something went in her eye. Has been flushing her eye out with saline and using eye drops. Denies pain or redness in left eye. She does not wear contacts. She wears glasses prn. Does not have eye doctor locally.  Denies: changes in vision, changes in peripheral/central vision, increase in headaches, changes in seizure control, appearance of "cobwebs" or worsening dark spot, sensation of "curtain" closing over eye, prior history of retina issues   Past Medical History:  Diagnosis Date  . Adenomatous polyp of sigmoid colon 03/18/2017   Overview:  Added automatically from request for surgery [phone]  . Alcoholism ([place])    01/18/18 - no use in 5 years  . Asymptomatic varicose veins 01/11/2017  . Iron deficiency anemia 01/12/2017   Last Assessment & Plan:  Given script for ferrous sulfate and order for repeat cbc with diff and iron studies to be done in 1 month Will also refer pt back to [facility] for repeat colonoscopy and to dietician for nutritional counseling, per pt request  . Lentigines 01/11/2017  . Melanocytic nevi of left lower limb, including hip 01/11/2017  . Melanocytic nevi of left upper limb, including shoulder 01/11/2017  . Melanocytic nevi of right lower limb, including hip 01/11/2017  . Melanocytic nevi of right upper limb, including shoulder 01/11/2017  . Melanocytic nevi of trunk 01/11/2017  . Obesity (BMI 30-39.9) 05/21/2017  . Osteoporosis   . Plantar fasciitis   . 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 Seizure ([place]) 01/25/2017   LAST SEIZURE- 2004   . Sleep apnea   . Unilateral primary osteoarthritis, left knee 12/11/2016     Social History   Socioeconomic History  .  Marital status: Married    Spouse name: Not on file  . Number of children: 1  . Years of education: college  . Highest education level: Bachelor's degree (e.g., BA, AB, BS)  Occupational History  . Occupation: Retired  Scientific laboratory technician  . Financial resource strain: Not on file  . Food insecurity    Worry: Not on file    Inability: Not on file  . Transportation needs    Medical: Not on file    Non-medical: Not on file  Tobacco Use  . Smoking status: Never Smoker  . Smokeless tobacco: Never Used  Substance and Sexual Activity  . Alcohol use: No  . Drug use: No  . Sexual activity: Never  Lifestyle  . Physical activity    Days per week: Not on file    Minutes per session: Not on file  . Stress: Not on file  Relationships  . Social Herbalist on phone: Not on file    Gets together: Not on file    Attends religious service: Not on file    Active member of club or organization: Not on file    Attends meetings of clubs or organizations: Not on file    Relationship status: Not on file  . Intimate partner violence    Fear of current or ex partner: Not on file    Emotionally abused: Not on file    Physically abused: Not on  file    Forced sexual activity: Not on file  Other Topics Concern  . Not on file  Social History Narrative   Lives at home with husband.   Right-handed.   3 cups caffeine per day.    Past Surgical History:  Procedure Laterality Date  . BARIATRIC SURGERY    . ECTOPIC PREGNANCY SURGERY    . 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 Mitral valve prolapse Mother   . Osteoporosis Mother   . Memory loss Mother   . Colon cancer Father   . Alcoholism Father     Allergies  Allergen Reactions  . Bee Venom Swelling    Current Medications:    Current Outpatient Medications:  .  Acetaminophen (TYLENOL PO), Take by mouth as needed., Disp: , Rfl:  .  Calcium-Vitamin D-Vitamin K 595-6387-56 MG-UNT-MCG TABS, Take 1 tablet by mouth 2 (two) times daily. Also includes Vitamin C, Disp: , Rfl:  .  Cholecalciferol (SM VITAMIN D3) 4000 units CAPS, Take 4,000 Units by mouth daily., Disp: , Rfl:  .  denosumab (PROLIA) 60 MG/ML SOLN injection, Inject 60 mg into the skin every 6 (six) months. Administer in upper arm, thigh, or abdomen, Disp: , Rfl:  .  ferrous sulfate (FERROUSUL) 325 (65 FE) MG tablet, Take 1 tablet (325 mg total) 3 (three) times daily with meals by mouth. (Patient taking differently: Take 325 mg by mouth daily with breakfast. ), Disp: , Rfl: 3 .  Glucosamine-Chondroit-Vit C-Mn (GLUCOSAMINE CHONDR 1500 COMPLX PO), Take 1 capsule by mouth daily., Disp: , Rfl:  .  lamoTRIgine (LAMICTAL) 150 MG tablet, Take 1 tablet (150 mg total) by mouth 2 (two) times daily., Disp: 180 tablet, Rfl: 1 .  levETIRAcetam (KEPPRA) 500 MG tablet, One tab po qam and 2 tabs po qhs. (Patient taking differently: 1 tab po qam and 1 tabs po qhs.), Disp: 270 tablet, Rfl: 4 .  Omega-3 Fatty Acids (OMEGA 3 PO), Take 1 capsule by mouth daily., Disp: , Rfl:  .  vitamin E 600 UNIT capsule, Take 600 Units by mouth daily., Disp: , Rfl:  .  EPINEPHrine 0.3 mg/0.3 mL IJ SOAJ injection, Inject 0.3 mLs (0.3 mg total) into the muscle as needed for anaphylaxis., Disp: 2 each, Rfl: 1   Review of Systems:   ROS  Negative unless otherwise specified per HPI.  Vitals:   There were no vitals filed for this visit.   There is no height or weight on file to calculate BMI.  Physical Exam:   Physical Exam Constitutional:      Appearance: She is well-developed.  HENT:     Head: Normocephalic and atraumatic.  Eyes:     General: Lids are normal. Vision grossly intact.     Extraocular Movements: Extraocular movements intact.     Conjunctiva/sclera: Conjunctivae normal.      Pupils:     Left eye: No fluorescein uptake.  Neck:     Musculoskeletal: Normal range of motion and neck supple.  Pulmonary:     Effort: Pulmonary effort is normal.  Musculoskeletal: Normal range of motion.  Skin:    General: Skin is warm and dry.  Neurological:     Mental Status: She is alert and oriented to person, place, and time.  Psychiatric:  Behavior: Behavior normal.        Thought Content: Thought content normal.        Judgment: Judgment normal.      Assessment and Plan:   Marriana was seen today for eye problem.  Diagnoses and all orders for this visit:  Vision abnormalities -     Ambulatory referral to Ophthalmology   Unclear etiology. Fluorescein uptake without significant findings on my exam. Due to lack of improvement of symptoms since onset, will sent to optho for formal evaluation. Reviewed worsening precautions with patient. Appointment with [facility]'s office tomorrow at 9:15am -- appreciate coordination of care.  . Reviewed expectations re: course of current medical issues. . Discussed self-management of symptoms. . Outlined signs and symptoms indicating need for more acute intervention. . Patient verbalized understanding and all questions were answered. . See orders for this visit as documented in the electronic medical record. . Patient received an After-Visit Summary.   Inda Coke, PA-C

## 2019-05-01 NOTE — Patient Instructions (Signed)
It was great to see you!  Please go see Dr. Katy Fitch as advised. Keep [place] posted on what he says!  Take care,  Inda Coke PA-C

## 2019-06-08 ENCOUNTER — Other Ambulatory Visit: Payer: Self-pay

## 2019-06-08 ENCOUNTER — Ambulatory Visit
Admission: RE | Admit: 2019-06-08 | Discharge: 2019-06-08 | Disposition: A | Discharge status: Home / Self Care | Payer: Medicare Other | Source: Ambulatory Visit | Attending: Family Medicine | Admitting: Family Medicine

## 2019-06-08 DIAGNOSIS — Z1231 Encounter for screening mammogram for malignant neoplasm of breast: Secondary | ICD-10-CM

## 2019-06-20 ENCOUNTER — Ambulatory Visit: Payer: Medicare Other | Admitting: Family Medicine

## 2019-06-20 NOTE — Progress Notes (Deleted)
Virtual Visit via Video   Due to the COVID-19 pandemic, this visit was completed with telemedicine (audio/video) technology to reduce patient and provider exposure as well as to preserve personal protective equipment.   I connected with Summer [name] by a video enabled telemedicine application and verified that I am speaking with the correct person using two identifiers. Location patient: Home Location provider: [facility], Office Persons participating in the virtual visit: AYLISSA LOHN, Briscoe Deutscher, DO   I discussed the limitations of evaluation and management by telemedicine and the availability of in person appointments. The patient expressed understanding and agreed to proceed.  Care Team   Patient Care Team: Briscoe Deutscher, DO as PCP - General (Family Medicine) [facility] as Consulting Physician (Dermatology)  Subjective:   HPI:   ROS   Patient Active Problem List   Diagnosis Date Noted  . Partial symptomatic epilepsy with complex partial seizures, not intractable, without status epilepticus ([place]) 01/18/2018  . Facial paresthesia 01/18/2018  . S/P total knee replacement 09/07/2017  . Obesity (BMI 30-39.9) 05/21/2017  . Anemia 05/21/2017  . Adenomatous polyp of sigmoid colon 03/18/2017  . Seizure ([place]) 01/25/2017  . Iron deficiency anemia 01/12/2017  . Neck pain 01/11/2017  . Osteoporosis 01/11/2017  . Lentigines 01/11/2017  . Unilateral primary osteoarthritis, left knee 12/11/2016  . Primary osteoarthritis, right shoulder 12/11/2016    Social History   Tobacco Use  . Smoking status: Never Smoker  . Smokeless tobacco: Never Used  Substance Use Topics  . Alcohol use: No    Current Outpatient Medications:  .  Acetaminophen (TYLENOL PO), Take by mouth as needed., Disp: , Rfl:  .  Calcium-Vitamin D-Vitamin K [id number] MG-UNT-MCG TABS, Take 1 tablet by mouth 2 (two) times daily. Also includes Vitamin C, Disp: , Rfl:  .  Cholecalciferol  (SM VITAMIN D3) 4000 units CAPS, Take 4,000 Units by mouth daily., Disp: , Rfl:  .  denosumab (PROLIA) 60 MG/ML SOLN injection, Inject 60 mg into the skin every 6 (six) months. Administer in upper arm, thigh, or abdomen, Disp: , Rfl:  .  EPINEPHrine 0.3 mg/0.3 mL IJ SOAJ injection, Inject 0.3 mLs (0.3 mg total) into the muscle as needed for anaphylaxis., Disp: 2 each, Rfl: 1 .  ferrous sulfate (FERROUSUL) 325 (65 FE) MG tablet, Take 1 tablet (325 mg total) 3 (three) times daily with meals by mouth. (Patient taking differently: Take 325 mg by mouth daily with breakfast. ), Disp: , Rfl: 3 .  Glucosamine-Chondroit-Vit C-Mn (GLUCOSAMINE CHONDR 1500 COMPLX PO), Take 1 capsule by mouth daily., Disp: , Rfl:  .  lamoTRIgine (LAMICTAL) 150 MG tablet, Take 1 tablet (150 mg total) by mouth 2 (two) times daily., Disp: 180 tablet, Rfl: 1 .  levETIRAcetam (KEPPRA) 500 MG tablet, One tab po qam and 2 tabs po qhs. (Patient taking differently: 1 tab po qam and 1 tabs po qhs.), Disp: 270 tablet, Rfl: 4 .  Omega-3 Fatty Acids (OMEGA 3 PO), Take 1 capsule by mouth daily., Disp: , Rfl:  .  vitamin E 600 UNIT capsule, Take 600 Units by mouth daily., Disp: , Rfl:   Allergies  Allergen Reactions  . Bee Venom Swelling    Objective:   VITALS: Per patient if applicable, see vitals. GENERAL: Alert, appears well and in no acute distress. HEENT: Atraumatic, conjunctiva clear, no obvious abnormalities on inspection of external nose and ears. NECK: Normal movements of the head and neck. CARDIOPULMONARY: No increased WOB. Speaking in clear sentences.  I:E ratio WNL.  MS: Moves all visible extremities without noticeable abnormality. PSYCH: Pleasant and cooperative, well-groomed. Speech normal rate and rhythm. Affect is appropriate. Insight and judgement are appropriate. Attention is focused, linear, and appropriate.  NEURO: CN grossly intact. Oriented as arrived to appointment on time with no prompting. Moves both UE equally.    SKIN: No obvious lesions, wounds, erythema, or cyanosis noted on face or hands.  Depression screen [facility] 2/9 09/08/2018 05/20/2017  Decreased Interest 0 0  Down, Depressed, Hopeless 0 0  PHQ - 2 Score 0 0    Assessment and Plan:   There are no diagnoses linked to this encounter.  Marland Kitchen COVID-19 Education: The signs and symptoms of COVID-19 were discussed with the patient and how to seek care for testing if needed. The importance of social distancing was discussed today. . Reviewed expectations re: course of current medical issues. . Discussed self-management of symptoms. . Outlined signs and symptoms indicating need for more acute intervention. . Patient verbalized understanding and all questions were answered. Marland Kitchen Health Maintenance issues including appropriate healthy diet, exercise, and smoking avoidance were discussed with patient. . See orders for this visit as documented in the electronic medical record.  Briscoe Deutscher, DO  Records requested if needed. Time spent: *** minutes, of which >50% was spent in obtaining information about her symptoms, reviewing her previous labs, evaluations, and treatments, counseling her about her condition (please see the discussed topics above), and developing a plan to further investigate it; she had a number of questions which I addressed.

## 2019-07-06 NOTE — Patient Instructions (Signed)
Health Maintenance Due  Topic Date Due  . INFLUENZA VACCINE  05/27/2019    Depression screen [facility] 2/9 09/08/2018 05/20/2017  Decreased Interest 0 0  Down, Depressed, Hopeless 0 0  PHQ - 2 Score 0 0

## 2019-07-06 NOTE — Progress Notes (Signed)
Virtual Visit via Video   Due to the COVID-19 pandemic, this visit was completed with telemedicine (audio/video) technology to reduce patient and provider exposure as well as to preserve personal protective equipment.   I connected with Summer [name] by a video enabled telemedicine application and verified that I am speaking with the correct person using two identifiers. Location patient: Home Location provider:  [facility], Office Persons participating in the virtual visit: KREE NEUJAHR, Briscoe Deutscher, DO   I discussed the limitations of evaluation and management by telemedicine and the availability of in person appointments. The patient expressed understanding and agreed to proceed.  Care Team   Patient Care Team: Briscoe Deutscher, DO as PCP - General (Family Medicine) [facility] as Consulting Physician (Dermatology)  Subjective:   HPI: Right shoulder pain x2 years.  Decreased range of motion, abduction.  Pain has worsened since she is now helping to take care of her 21 and 71-year-old grandchildren.  She has never had this evaluated.  She does go to [facility] for a history of [facility] replacement.  Patient wants to discuss weight management.  Her weight is around 220 pounds right now.  Previously, she was exercising regularly at the [facility].  She is using the app [employer].  Still exercising but less.  Interested in meeting with dietitian.  Patient Active Problem List   Diagnosis Date Noted  . Mixed hyperlipidemia 07/07/2019  . Vitamin D deficiency 07/07/2019  . Partial symptomatic epilepsy with complex partial seizures, not intractable, without status epilepticus ([place]) 01/18/2018  . Facial paresthesia 01/18/2018  . S/P total knee replacement 09/07/2017  . Obesity (BMI 30-39.9) 05/21/2017  . Anemia 05/21/2017  . Adenomatous polyp of sigmoid colon 03/18/2017  . Seizure ([place]) 01/25/2017  . Iron deficiency anemia 01/12/2017  . Neck pain 01/11/2017  . Osteoporosis  01/11/2017  . Lentigines 01/11/2017  . Unilateral primary osteoarthritis, left knee 12/11/2016  . Primary osteoarthritis, right shoulder 12/11/2016    Social History   Tobacco Use  . Smoking status: Never Smoker  . Smokeless tobacco: Never Used  Substance Use Topics  . Alcohol use: No   Current Outpatient Medications:  .  Acetaminophen (TYLENOL PO), Take by mouth as needed., Disp: , Rfl:  .  Calcium-Vitamin D-Vitamin K [id number] MG-UNT-MCG TABS, Take 1 tablet by mouth 2 (two) times daily. Also includes Vitamin C, Disp: , Rfl:  .  Cholecalciferol (SM VITAMIN D3) 4000 units CAPS, Take 4,000 Units by mouth daily., Disp: , Rfl:  .  denosumab (PROLIA) 60 MG/ML SOLN injection, Inject 60 mg into the skin every 6 (six) months. Administer in upper arm, thigh, or abdomen, Disp: , Rfl:  .  EPINEPHrine 0.3 mg/0.3 mL IJ SOAJ injection, Inject 0.3 mLs (0.3 mg total) into the muscle as needed for anaphylaxis., Disp: 2 each, Rfl: 1 .  ferrous sulfate (FERROUSUL) 325 (65 FE) MG tablet, Take 1 tablet (325 mg total) 3 (three) times daily with meals by mouth. (Patient taking differently: Take 325 mg by mouth daily with breakfast. ), Disp: , Rfl: 3 .  Glucosamine-Chondroit-Vit C-Mn (GLUCOSAMINE CHONDR 1500 COMPLX PO), Take 1 capsule by mouth daily., Disp: , Rfl:  .  lamoTRIgine (LAMICTAL) 150 MG tablet, Take 1 tablet (150 mg total) by mouth 2 (two) times daily., Disp: 180 tablet, Rfl: 1 .  levETIRAcetam (KEPPRA) 500 MG tablet, One tab po qam and 2 tabs po qhs. (Patient taking differently: 1 tab po qam and 1 tabs po qhs.), Disp: 270 tablet, Rfl:  4 .  Omega-3 Fatty Acids (OMEGA 3 PO), Take 1 capsule by mouth daily., Disp: , Rfl:  .  vitamin E 600 UNIT capsule, Take 600 Units by mouth daily., Disp: , Rfl:   Allergies  Allergen Reactions  . Bee Venom Swelling   Objective:   VITALS: Per patient if applicable, see vitals. GENERAL: Alert, appears well and in no acute distress. HEENT: Atraumatic, conjunctiva  clear, no obvious abnormalities on inspection of external nose and ears. NECK: Normal movements of the head and neck. CARDIOPULMONARY: No increased WOB. Speaking in clear sentences. I:E ratio WNL.  MS: Right arm decreased abduction. PSYCH: Pleasant and cooperative, well-groomed. Speech normal rate and rhythm. Affect is appropriate. Insight and judgement are appropriate. Attention is focused, linear, and appropriate.  NEURO: CN grossly intact. Oriented as arrived to appointment on time with no prompting. Moves both UE equally.  SKIN: No obvious lesions, wounds, erythema, or cyanosis noted on face or hands.  Depression screen [name] 2/9 09/08/2018 05/20/2017  Decreased Interest 0 0  Down, Depressed, Hopeless 0 0  PHQ - 2 Score 0 0   Assessment and Plan:   Diagnoses and all orders for this visit:  Chronic right shoulder pain Comments: With aspects of frozen shoulder at this point.  Will refer to orthopedics for further evaluation and treatment. Orders: -     Ambulatory referral to Orthopedic Surgery  Vitamin D deficiency -     VITAMIN D 25 Hydroxy (Vit-D Deficiency, Fractures); Future  Iron deficiency anemia due to chronic blood loss -     CBC with Differential/Platelet; Future  Obesity (BMI 30-39.9) Comments: Will set up with Inda Coke for nutrition visit.  Discussed Angel fish chronic pain water class at the [facility].  Osteoporosis without current pathological fracture, unspecified osteoporosis type Comments: Due for Prolia injection.  We will go ahead and set up.  Mixed hyperlipidemia -     Comprehensive metabolic panel; Future -     Lipid panel; Future  Medication management -     TSH; Future   . COVID-19 Education: The signs and symptoms of COVID-19 were discussed with the patient and how to seek care for testing if needed. The importance of social distancing was discussed today. . Reviewed expectations re: course of current medical issues. . Discussed self-management  of symptoms. . Outlined signs and symptoms indicating need for more acute intervention. . Patient verbalized understanding and all questions were answered. Marland Kitchen Health Maintenance issues including appropriate healthy diet, exercise, and smoking avoidance were discussed with patient. . See orders for this visit as documented in the electronic medical record.  Briscoe Deutscher, DO  Records requested if needed. Time spent: 25 minutes, of which >50% was spent in obtaining information about her symptoms, reviewing her previous labs, evaluations, and treatments, counseling her about her condition (please see the discussed topics above), and developing a plan to further investigate it; she had a number of questions which I addressed.

## 2019-07-07 ENCOUNTER — Ambulatory Visit (INDEPENDENT_AMBULATORY_CARE_PROVIDER_SITE_OTHER): Payer: Medicare Other | Admitting: Family Medicine

## 2019-07-07 ENCOUNTER — Encounter: Payer: Self-pay | Admitting: Family Medicine

## 2019-07-07 DIAGNOSIS — M25511 Pain in right shoulder: Secondary | ICD-10-CM | POA: Diagnosis not present

## 2019-07-07 DIAGNOSIS — Z79899 Other long term (current) drug therapy: Secondary | ICD-10-CM

## 2019-07-07 DIAGNOSIS — D500 Iron deficiency anemia secondary to blood loss (chronic): Secondary | ICD-10-CM

## 2019-07-07 DIAGNOSIS — E785 Hyperlipidemia, unspecified: Secondary | ICD-10-CM | POA: Insufficient documentation

## 2019-07-07 DIAGNOSIS — M810 Age-related osteoporosis without current pathological fracture: Secondary | ICD-10-CM

## 2019-07-07 DIAGNOSIS — E559 Vitamin D deficiency, unspecified: Secondary | ICD-10-CM | POA: Diagnosis not present

## 2019-07-07 DIAGNOSIS — E782 Mixed hyperlipidemia: Secondary | ICD-10-CM

## 2019-07-07 DIAGNOSIS — E669 Obesity, unspecified: Secondary | ICD-10-CM

## 2019-07-07 DIAGNOSIS — G8929 Other chronic pain: Secondary | ICD-10-CM

## 2019-07-14 ENCOUNTER — Telehealth: Payer: Self-pay

## 2019-07-14 NOTE — Telephone Encounter (Signed)
Pt ok to be scheduled for Prolia?  Copied from [place] [id number]. Topic: Appointment Scheduling - Scheduling Inquiry for Clinic >> Jul 14, 2019  2:30 PM Erick Blinks wrote: Reason for CRM: Pt called requesting to be scheduled for Prolia injection. Please advise  Best contact: [phone]

## 2019-07-17 NOTE — Telephone Encounter (Signed)
Checking benefits

## 2019-07-19 NOTE — Telephone Encounter (Signed)
Yes, ok to schedule Prolia injection.  Thanks!

## 2019-07-21 ENCOUNTER — Ambulatory Visit (INDEPENDENT_AMBULATORY_CARE_PROVIDER_SITE_OTHER): Payer: Medicare Other

## 2019-07-21 ENCOUNTER — Other Ambulatory Visit: Payer: Self-pay

## 2019-07-21 DIAGNOSIS — Z23 Encounter for immunization: Secondary | ICD-10-CM | POA: Diagnosis not present

## 2019-07-21 DIAGNOSIS — M810 Age-related osteoporosis without current pathological fracture: Secondary | ICD-10-CM | POA: Diagnosis not present

## 2019-07-21 MED ORDER — DENOSUMAB 60 MG/ML ~~LOC~~ SOSY
60.0000 mg | PREFILLED_SYRINGE | Freq: Once | SUBCUTANEOUS | Status: AC
  Administered 2019-07-21: 60 mg via SUBCUTANEOUS

## 2019-07-21 NOTE — Progress Notes (Addendum)
Per orders of Dr. [name], injection of Prolia injection given by Piercen Covino L Leane Loring in left arm subcutaneous. Patient tolerated injection well. }

## 2019-07-21 NOTE — Patient Instructions (Signed)
Health Maintenance Due  Topic Date Due  . INFLUENZA VACCINE  05/27/2019    Depression screen [facility] 2/9 09/08/2018 05/20/2017  Decreased Interest 0 0  Down, Depressed, Hopeless 0 0  PHQ - 2 Score 0 0

## 2019-07-21 NOTE — Addendum Note (Signed)
Addended by: Darral Dash on: 07/21/2019 03:34 PM   Modules accepted: Level of Service

## 2019-07-23 ENCOUNTER — Other Ambulatory Visit: Payer: Self-pay | Admitting: Neurology

## 2019-08-11 ENCOUNTER — Encounter: Payer: Self-pay | Admitting: Family Medicine

## 2019-08-18 ENCOUNTER — Other Ambulatory Visit (INDEPENDENT_AMBULATORY_CARE_PROVIDER_SITE_OTHER): Payer: Medicare Other

## 2019-08-18 ENCOUNTER — Other Ambulatory Visit: Payer: Self-pay

## 2019-08-18 DIAGNOSIS — E782 Mixed hyperlipidemia: Secondary | ICD-10-CM | POA: Diagnosis not present

## 2019-08-18 DIAGNOSIS — D500 Iron deficiency anemia secondary to blood loss (chronic): Secondary | ICD-10-CM

## 2019-08-18 DIAGNOSIS — E559 Vitamin D deficiency, unspecified: Secondary | ICD-10-CM

## 2019-08-18 DIAGNOSIS — Z79899 Other long term (current) drug therapy: Secondary | ICD-10-CM | POA: Diagnosis not present

## 2019-08-18 LAB — COMPREHENSIVE METABOLIC PANEL
ALT: 19 U/L (ref 0–35)
AST: 21 U/L (ref 0–37)
Albumin: 4.4 g/dL (ref 3.5–5.2)
Alkaline Phosphatase: 86 U/L (ref 39–117)
BUN: 16 mg/dL (ref 6–23)
CO2: 29 mEq/L (ref 19–32)
Calcium: 8.4 mg/dL (ref 8.4–10.5)
Chloride: 103 mEq/L (ref 96–112)
Creatinine, Ser: 0.56 mg/dL (ref 0.40–1.20)
GFR: 107.69 mL/min (ref >60.00)
Glucose, Bld: 91 mg/dL (ref 70–99)
Potassium: 4.3 mEq/L (ref 3.5–5.1)
Sodium: 139 mEq/L (ref 135–145)
Total Bilirubin: 0.4 mg/dL (ref 0.2–1.2)
Total Protein: 6.5 g/dL (ref 6.0–8.3)

## 2019-08-18 LAB — CBC WITH DIFFERENTIAL/PLATELET
Basophils Absolute: 0 10*3/uL (ref 0.0–0.1)
Basophils Relative: 0.3 % (ref 0.0–3.0)
Eosinophils Absolute: 0.1 10*3/uL (ref 0.0–0.7)
Eosinophils Relative: 2.6 % (ref 0.0–5.0)
HCT: 43.1 % (ref 36.0–46.0)
Hemoglobin: 14.2 g/dL (ref 12.0–15.0)
Lymphocytes Relative: 42.1 % (ref 12.0–46.0)
Lymphs Abs: 2.1 10*3/uL (ref 0.7–4.0)
MCHC: 33.1 g/dL (ref 30.0–36.0)
MCV: 93.7 fl (ref 78.0–100.0)
Monocytes Absolute: 0.4 10*3/uL (ref 0.1–1.0)
Monocytes Relative: 8.8 % (ref 3.0–12.0)
Neutro Abs: 2.3 10*3/uL (ref 1.4–7.7)
Neutrophils Relative %: 46.2 % (ref 43.0–77.0)
Platelets: 230 10*3/uL (ref 150.0–400.0)
RBC: 4.6 Mil/uL (ref 3.87–5.11)
RDW: 13.4 % (ref 11.5–15.5)
WBC: 5.1 10*3/uL (ref 4.0–10.5)

## 2019-08-18 LAB — LIPID PANEL
Cholesterol: 200 mg/dL (ref 0–200)
HDL: 63.7 mg/dL (ref >39.00)
LDL Cholesterol: 126 mg/dL — ABNORMAL HIGH (ref 0–99)
NonHDL: 135.83
Total CHOL/HDL Ratio: 3
Triglycerides: 49 mg/dL (ref 0.0–149.0)
VLDL: 9.8 mg/dL (ref 0.0–40.0)

## 2019-08-18 LAB — TSH: TSH: 3.04 u[IU]/mL (ref 0.35–4.50)

## 2019-08-18 LAB — VITAMIN D 25 HYDROXY (VIT D DEFICIENCY, FRACTURES): VITD: 29.86 ng/mL — ABNORMAL LOW (ref 30.00–100.00)

## 2019-08-22 ENCOUNTER — Encounter: Payer: Self-pay | Admitting: Family Medicine

## 2019-08-23 NOTE — Telephone Encounter (Signed)
Please call and schedule. See message.

## 2019-08-25 ENCOUNTER — Telehealth: Payer: Self-pay | Admitting: Family Medicine

## 2019-08-25 NOTE — Telephone Encounter (Signed)
Summer [name] please call pt and schedule with new provider she is wanting Dr. Rogers Blocker and then ask [facility] about referral. Thanks

## 2019-08-25 NOTE — Telephone Encounter (Signed)
See note

## 2019-08-25 NOTE — Telephone Encounter (Signed)
Lmom for her to call back to schedule a TOC to Dr Rogers Blocker.

## 2019-08-25 NOTE — Telephone Encounter (Signed)
Patient is calling to request referral to be placed to [facility] for her shoulder. Discuss with Dr. [name] previously. Please advise CB- [phone]

## 2019-09-11 ENCOUNTER — Other Ambulatory Visit: Payer: Self-pay | Admitting: Family Medicine

## 2019-09-11 DIAGNOSIS — G40209 Localization-related (focal) (partial) symptomatic epilepsy and epileptic syndromes with complex partial seizures, not intractable, without status epilepticus: Secondary | ICD-10-CM

## 2019-09-12 NOTE — Telephone Encounter (Signed)
pls see message and advise.  Thanks

## 2019-09-12 NOTE — Telephone Encounter (Signed)
See note

## 2019-09-12 NOTE — Telephone Encounter (Signed)
Pt will see dr Rogers Blocker in jan 2021

## 2019-09-13 ENCOUNTER — Telehealth (INDEPENDENT_AMBULATORY_CARE_PROVIDER_SITE_OTHER): Payer: Self-pay | Admitting: Family Medicine

## 2019-09-13 NOTE — Telephone Encounter (Signed)
Copied from [place] [id number]. Topic: Quick Communication - Rx Refill/Question >> Sep 13, 2019  2:01 PM Leward Quan A wrote: Medication: lamoTRIgine (LAMICTAL) 150 MG tablet   Per patient this is the third call and need it because she only have 3 tabs left   Has the patient contacted their pharmacy? Yes.   (Agent: If no, request that the patient contact the pharmacy for the refill.) (Agent: If yes, when and what did the pharmacy advise?)  Preferred Pharmacy (with phone number or street name): [place], [place] [phone] (Phone   Agent: Please be advised that RX refills may take up to 3 business days. We ask that you follow-up with your pharmacy.

## 2019-09-13 NOTE — Telephone Encounter (Signed)
I already refilled this... Orma Flaming, MD [place]

## 2019-09-13 NOTE — Telephone Encounter (Signed)
Spoke to patient and advised that refill had already been sent in this morning per Dr. Rogers Blocker.  Patient verbalized understanding.

## 2019-09-13 NOTE — Telephone Encounter (Signed)
Please advise on refilling one time, patient has an appointment to transfer care in January to Dr. Rogers Blocker.   Copied from [place] [id number]. Topic: General - Other >> Sep 13, 2019  2:10 PM Leward Quan A wrote: Reason for CRM: Patient called just a bit abrasive because she was not able to get her refill of her medication lamoTRIgine (LAMICTAL) 150 MG tablet  from the pharmacy. She states that she only have 3 pills left and need the Rx sent in to the pharmacy today and want a call back at Ph# [phone] (

## 2019-09-13 NOTE — Telephone Encounter (Signed)
See note

## 2019-09-14 ENCOUNTER — Ambulatory Visit: Payer: Medicare Other

## 2019-09-14 ENCOUNTER — Other Ambulatory Visit: Payer: Self-pay

## 2019-09-14 ENCOUNTER — Ambulatory Visit (INDEPENDENT_AMBULATORY_CARE_PROVIDER_SITE_OTHER): Payer: Medicare Other

## 2019-09-14 VITALS — BP 138/76 | Temp 97.3°F | Ht 66.0 in | Wt 227.0 lb

## 2019-09-14 DIAGNOSIS — Z0000 Encounter for general adult medical examination without abnormal findings: Secondary | ICD-10-CM | POA: Diagnosis not present

## 2019-09-14 DIAGNOSIS — Z23 Encounter for immunization: Secondary | ICD-10-CM | POA: Diagnosis not present

## 2019-09-14 NOTE — Patient Instructions (Signed)
[name] , Thank you for taking time to come for your Medicare Wellness Visit. I appreciate your ongoing commitment to your health goals. Please review the following plan we discussed and let me know if I can assist you in the future.   Screening recommendations/referrals: Colorectal Screening: up to date; last 03/18/17; repeat 5 years Mammogram: up to date; last 06/08/19 Bone Density: up to date; last 03/15/17  Vision and Dental Exams: Recommended annual ophthalmology exams for early detection of glaucoma and other disorders of the eye Recommended annual dental exams for proper oral hygiene  Vaccinations: Influenza vaccine: completed 07/21/19 Pneumococcal vaccine: up to date; Pneumovax 23 today Tdap vaccine: up to date; last 08/17/18  Shingles vaccine: Please call your insurance company to determine your out of pocket expense for the Shingrix vaccine. You may receive this vaccine at your local pharmacy.  Advanced directives: Please bring a copy of your POA (Power of Attorney) and/or Living Will to your next appointment.  Goals: Recommend to drink at least 6-8 8oz glasses of water per day and consume a balanced diet rich in fresh fruits and vegetables.    Next appointment: Please schedule your Annual Wellness Visit with your Nurse Health Advisor in one year.  Preventive Care 19 Years and Older, Female Preventive care refers to lifestyle choices and visits with your health care provider that can promote health and wellness. What does preventive care include?  A yearly physical exam. This is also called an annual well check.  Dental exams once or twice a year.  Routine eye exams. Ask your health care provider how often you should have your eyes checked.  Personal lifestyle choices, including:  Daily care of your teeth and gums.  Regular physical activity.  Eating a healthy diet.  Avoiding tobacco and drug use.  Limiting alcohol use.  Practicing safe sex.  Taking low-dose  aspirin every day if recommended by your health care provider.  Taking vitamin and mineral supplements as recommended by your health care provider. What happens during an annual well check? The services and screenings done by your health care provider during your annual well check will depend on your age, overall health, lifestyle risk factors, and family history of disease. Counseling  Your health care provider may ask you questions about your:  Alcohol use.  Tobacco use.  Drug use.  Emotional well-being.  Home and relationship well-being.  Sexual activity.  Eating habits.  History of falls.  Memory and ability to understand (cognition).  Work and work Statistician.  Reproductive health. Screening  You may have the following tests or measurements:  Height, weight, and BMI.  Blood pressure.  Lipid and cholesterol levels. These may be checked every 5 years, or more frequently if you are over 78 years old.  Skin check.  Lung cancer screening. You may have this screening every year starting at age 67 if you have a 30-pack-year history of smoking and currently smoke or have quit within the past 15 years.  Fecal occult blood test (FOBT) of the stool. You may have this test every year starting at age 51.  Flexible sigmoidoscopy or colonoscopy. You may have a sigmoidoscopy every 5 years or a colonoscopy every 10 years starting at age 40.  Hepatitis C blood test.  Hepatitis B blood test.  Sexually transmitted disease (STD) testing.  Diabetes screening. This is done by checking your blood sugar (glucose) after you have not eaten for a while (fasting). You may have this done every 1-3 years.  Bone density scan. This is done to screen for osteoporosis. You may have this done starting at age 42.  Mammogram. This may be done every 1-2 years. Talk to your health care provider about how often you should have regular mammograms. Talk with your health care provider about your  test results, treatment options, and if necessary, the need for more tests. Vaccines  Your health care provider may recommend certain vaccines, such as:  Influenza vaccine. This is recommended every year.  Tetanus, diphtheria, and acellular pertussis (Tdap, Td) vaccine. You may need a Td booster every 10 years.  Zoster vaccine. You may need this after age 71.  Pneumococcal 13-valent conjugate (PCV13) vaccine. One dose is recommended after age 22.  Pneumococcal polysaccharide (PPSV23) vaccine. One dose is recommended after age 66. Talk to your health care provider about which screenings and vaccines you need and how often you need them. This information is not intended to replace advice given to you by your health care provider. Make sure you discuss any questions you have with your health care provider. Document Released: 11/08/2015 Document Revised: 07/01/2016 Document Reviewed: 08/13/2015 Elsevier Interactive Patient Education  2017 [place] Prevention in the Home Falls can cause injuries. They can happen to people of all ages. There are many things you can do to make your home safe and to help prevent falls. What can I do on the outside of my home?  Regularly fix the edges of walkways and driveways and fix any cracks.  Remove anything that might make you trip as you walk through a door, such as a raised step or threshold.  Trim any bushes or trees on the path to your home.  Use bright outdoor lighting.  Clear any walking paths of anything that might make someone trip, such as rocks or tools.  Regularly check to see if handrails are loose or broken. Make sure that both sides of any steps have handrails.  Any raised decks and porches should have guardrails on the edges.  Have any leaves, snow, or ice cleared regularly.  Use sand or salt on walking paths during winter.  Clean up any spills in your garage right away. This includes oil or grease spills. What can I  do in the bathroom?  Use night lights.  Install grab bars by the toilet and in the tub and shower. Do not use towel bars as grab bars.  Use non-skid mats or decals in the tub or shower.  If you need to sit down in the shower, use a plastic, non-slip stool.  Keep the floor dry. Clean up any water that spills on the floor as soon as it happens.  Remove soap buildup in the tub or shower regularly.  Attach bath mats securely with double-sided non-slip rug tape.  Do not have throw rugs and other things on the floor that can make you trip. What can I do in the bedroom?  Use night lights.  Make sure that you have a light by your bed that is easy to reach.  Do not use any sheets or blankets that are too big for your bed. They should not hang down onto the floor.  Have a firm chair that has side arms. You can use this for support while you get dressed.  Do not have throw rugs and other things on the floor that can make you trip. What can I do in the kitchen?  Clean up any spills right away.  Avoid walking  on wet floors.  Keep items that you use a lot in easy-to-reach places.  If you need to reach something above you, use a strong step stool that has a grab bar.  Keep electrical cords out of the way.  Do not use floor polish or wax that makes floors slippery. If you must use wax, use non-skid floor wax.  Do not have throw rugs and other things on the floor that can make you trip. What can I do with my stairs?  Do not leave any items on the stairs.  Make sure that there are handrails on both sides of the stairs and use them. Fix handrails that are broken or loose. Make sure that handrails are as long as the stairways.  Check any carpeting to make sure that it is firmly attached to the stairs. Fix any carpet that is loose or worn.  Avoid having throw rugs at the top or bottom of the stairs. If you do have throw rugs, attach them to the floor with carpet tape.  Make sure that  you have a light switch at the top of the stairs and the bottom of the stairs. If you do not have them, ask someone to add them for you. What else can I do to help prevent falls?  Wear shoes that:  Do not have high heels.  Have rubber bottoms.  Are comfortable and fit you well.  Are closed at the toe. Do not wear sandals.  If you use a stepladder:  Make sure that it is fully opened. Do not climb a closed stepladder.  Make sure that both sides of the stepladder are locked into place.  Ask someone to hold it for you, if possible.  Clearly mark and make sure that you can see:  Any grab bars or handrails.  First and last steps.  Where the edge of each step is.  Use tools that help you move around (mobility aids) if they are needed. These include:  Canes.  Walkers.  Scooters.  Crutches.  Turn on the lights when you go into a dark area. Replace any light bulbs as soon as they burn out.  Set up your furniture so you have a clear path. Avoid moving your furniture around.  If any of your floors are uneven, fix them.  If there are any pets around you, be aware of where they are.  Review your medicines with your doctor. Some medicines can make you feel dizzy. This can increase your chance of falling. Ask your doctor what other things that you can do to help prevent falls. This information is not intended to replace advice given to you by your health care provider. Make sure you discuss any questions you have with your health care provider. Document Released: 08/08/2009 Document Revised: 03/19/2016 Document Reviewed: 11/16/2014 Elsevier Interactive Patient Education  2017 [employer].

## 2019-09-14 NOTE — Progress Notes (Signed)
I have reviewed the documentation from the recent AWV done by Courtney Slade; I agree with the documentation and will follow up on any recommendations or abnormal findings as suggested. Tycho Cheramie, MD [facility]    

## 2019-09-14 NOTE — Progress Notes (Signed)
Subjective:   Summer [name] is a 68 y.o. female who presents for Medicare Annual (Subsequent) preventive examination.  Review of Systems:   Cardiac Risk Factors include: advanced age (>14men, >42 women);dyslipidemia;sedentary lifestyle    Objective:     Vitals: BP 138/76   Temp (!) 97.3 F (36.3 C)   Ht 5\' 6"  (1.676 m)   Wt 227 lb (103 kg)   BMI 36.64 kg/m   Body mass index is 36.64 kg/m.  Advanced Directives 09/14/2019 09/08/2018 11/04/2017 09/07/2017 09/07/2017 09/01/2017 06/28/2017  Does Patient Have a Medical Advance Directive? Yes Yes Yes Yes Yes Yes No  Type of Advance Directive Living will;Healthcare Power of [place] -  Does patient want to make changes to medical advance directive? No - Patient declined - - No - Patient declined No - Patient declined No - Patient declined -  Copy of [place] in Chart? No - copy requested - Yes No - copy requested No - copy requested - -    Tobacco Social History   Tobacco Use  Smoking Status Never Smoker  Smokeless Tobacco Never Used     Counseling given: Not Answered   Clinical Intake:  Pre-visit preparation completed: Yes  Pain : No/denies pain(intermittent right shoulder pain- seeing ortho)  Diabetes: No  How often do you need to have someone help you when you read instructions, pamphlets, or other written materials from your doctor or pharmacy?: 1 - Never  Interpreter Needed?: No  Information entered by :: Denman George LPN  Past Medical History:  Diagnosis Date  . Adenomatous polyp of sigmoid colon 03/18/2017   Overview:  Added automatically from request for surgery [id number]  . Alcoholism ([place])    01/18/18 - no use in 5 years  . Asymptomatic varicose veins 01/11/2017  . Iron deficiency anemia 01/12/2017   Last Assessment & Plan:  Given  script for ferrous sulfate and order for repeat cbc with diff and iron studies to be done in 1 month Will also refer pt back to [facility] for repeat colonoscopy and to dietician for nutritional counseling, per pt request  . Lentigines 01/11/2017  . Melanocytic nevi of left lower limb, including hip 01/11/2017  . Melanocytic nevi of left upper limb, including shoulder 01/11/2017  . Melanocytic nevi of right lower limb, including hip 01/11/2017  . Melanocytic nevi of right upper limb, including shoulder 01/11/2017  . Melanocytic nevi of trunk 01/11/2017  . Obesity (BMI 30-39.9) 05/21/2017  . Osteoporosis   . Plantar fasciitis   . 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 Seizure ([place]) 01/25/2017   LAST SEIZURE- 2004   . Sleep apnea   . Unilateral primary osteoarthritis, left knee 12/11/2016   Past Surgical History:  Procedure Laterality Date  . BARIATRIC SURGERY    . ECTOPIC PREGNANCY SURGERY    . 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 Mitral valve prolapse Mother   . Osteoporosis Mother   . Memory loss Mother   . Colon cancer Father   . Alcoholism Father    Social History   Socioeconomic History  . Marital status: Married    Spouse name: Not on file  .  Number of children: 1  . Years of education: college  . Highest education level: Bachelor's degree (e.g., BA, AB, BS)  Occupational History  . Occupation: Retired  Scientific laboratory technician  . Financial resource strain: Not on file  . Food insecurity    Worry: Not on file    Inability: Not on file  . Transportation needs    Medical: Not on file    Non-medical: Not on file  Tobacco Use  . Smoking status: Never Smoker  . Smokeless tobacco: Never Used  Substance and Sexual Activity  . Alcohol use: No  . Drug use: No  . Sexual activity:  Never  Lifestyle  . Physical activity    Days per week: Not on file    Minutes per session: Not on file  . Stress: Not on file  Relationships  . Social Herbalist on phone: Not on file    Gets together: Not on file    Attends religious service: Not on file    Active member of club or organization: Not on file    Attends meetings of clubs or organizations: Not on file    Relationship status: Not on file  Other Topics Concern  . Not on file  Social History Narrative   Lives at home with husband.   Right-handed.   3 cups caffeine per day.   Cares for 2 grandchildren 3 days per week     Outpatient Encounter Medications as of 09/14/2019  Medication Sig  . Acetaminophen (TYLENOL PO) Take by mouth as needed.  . Calcium-Vitamin D-Vitamin K [phone] MG-UNT-MCG TABS Take 1 tablet by mouth 2 (two) times daily. Also includes Vitamin C  . Cholecalciferol (SM VITAMIN D3) 4000 units CAPS Take 4,000 Units by mouth daily.  Marland Kitchen denosumab (PROLIA) 60 MG/ML SOLN injection Inject 60 mg into the skin every 6 (six) months. Administer in upper arm, thigh, or abdomen  . EPINEPHrine 0.3 mg/0.3 mL IJ SOAJ injection Inject 0.3 mLs (0.3 mg total) into the muscle as needed for anaphylaxis.  . ferrous sulfate (FERROUSUL) 325 (65 FE) MG tablet Take 1 tablet (325 mg total) 3 (three) times daily with meals by mouth. (Patient taking differently: Take 325 mg by mouth daily with breakfast. )  . Glucosamine-Chondroit-Vit C-Mn (GLUCOSAMINE CHONDR 1500 COMPLX PO) Take 1 capsule by mouth daily.  Marland Kitchen lamoTRIgine (LAMICTAL) 150 MG tablet TAKE ONE TABLET BY MOUTH TWICE A DAY  . levETIRAcetam (KEPPRA) 500 MG tablet TAKE ONE TABLET BY MOUTH EVERY MORNING AND TAKE TWO TABLETS BY MOUTH EVERY NIGHT AT BEDTIME  . Omega-3 Fatty Acids (OMEGA 3 PO) Take 1 capsule by mouth daily.  . vitamin E 600 UNIT capsule Take 600 Units by mouth daily.   No facility-administered encounter medications on file as of 09/14/2019.      Activities of Daily Living In your present state of health, do you have any difficulty performing the following activities: 09/14/2019  Hearing? N  Vision? N  Difficulty concentrating or making decisions? N  Walking or climbing stairs? N  Dressing or bathing? N  Doing errands, shopping? N  Preparing Food and eating ? N  Using the Toilet? N  In the past six months, have you accidently leaked urine? N  Do you have problems with loss of bowel control? N  Managing your Medications? N  Managing your Finances? N  Housekeeping or managing your Housekeeping? N  Some recent data might be hidden    Patient Care Team: Briscoe Deutscher,  DO as PCP - General (Family Medicine) [facility] as Consulting Physician (Dermatology) Warden Fillers, MD as Consulting Physician (Ophthalmology) [facility] (Specialist) [facility] as Consulting Physician (Specialist)    Assessment:   This is a routine wellness examination for [facility].  Exercise Activities and Dietary recommendations Current Exercise Habits: The patient does not participate in regular exercise at present  Goals    . Patient Stated     To spend time with Grandchildren!!!       Fall Risk Fall Risk  09/14/2019 09/08/2018 05/20/2017  Falls in the past year? 0 0 No  Injury with Fall? 0 - -  Follow up Falls evaluation completed;Education provided;Falls prevention discussed - -   Is the patient's home free of loose throw rugs in walkways, pet beds, electrical cords, etc?   yes      Grab bars in the bathroom? yes      Handrails on the stairs?   yes      Adequate lighting?   yes  Timed Get Up and Go performed: completed and within normal timeframe; no gait abnormalities noted   Depression Screen PHQ 2/9 Scores 09/14/2019 09/08/2018 05/20/2017  PHQ - 2 Score 0 0 0     Cognitive Function- no cognitive concerns at this time Cognitive Testing  Alert? Yes         Normal Appearance? Yes  Oriented to person? Yes            Place? Yes  Time? Yes  Recall of three objects? Yes  Can perform simple calculations? Yes  Displays appropriate judgment? Yes  Can read the correct time from a watch face? Yes   MMSE - Mini Mental State Exam 09/08/2018  Not completed: (No Data)        Immunization History  Administered Date(s) Administered  . Fluad Quad(high Dose 65+) 07/21/2019  . H1N1 09/12/2008  . Influenza Split 06/28/2011, 10/29/2012  . Influenza, High Dose Seasonal PF 01/01/2015, 09/07/2016, 08/02/2018  . Influenza, Quadrivalent, Recombinant, Inj, Pf 01/01/2015, 09/07/2016  . Influenza,inj,Quad PF,6+ Mos 01/01/2015, 09/07/2016  . Influenza,trivalent, recombinat, inj, PF 06/28/2011, 10/29/2012  . Influenza-Unspecified 01/01/2015, 09/07/2016, 08/30/2017  . Pneumococcal Conjugate-13 10/01/2016, 08/23/2018  . Pneumococcal Polysaccharide-23 12/20/2013, 09/14/2019  . Tdap 01/27/2012, 09/08/2016, 08/17/2018    Qualifies for Shingles Vaccine?Discussed and patient will check with pharmacy for coverage.  Patient education handout provided    Screening Tests Health Maintenance  Topic Date Due  . PNA vac Low Risk Adult (2 of 2 - PPSV23) 08/24/2019  . MAMMOGRAM  06/07/2021  . COLONOSCOPY  03/19/2027  . TETANUS/TDAP  08/17/2028  . INFLUENZA VACCINE  Completed  . DEXA SCAN  Completed  . Hepatitis C Screening  Completed    Cancer Screenings: Lung: Low Dose CT Chest recommended if Age 105-80 years, 30 pack-year currently smoking OR have quit w/in 15years. Patient does not qualify. Breast:  Up to date on Mammogram? Yes   Up to date of Bone Density/Dexa? Yes Colorectal: colonoscopy 03/18/17 with Dr. Sigurd Sos   Plan:  I have personally reviewed and addressed the Medicare Annual Wellness questionnaire and have noted the following in the patient's chart:  A. Medical and social history B. Use of alcohol, tobacco or illicit drugs  C. Current medications and supplements D. Functional ability and status E.   Nutritional status F.  Physical activity G. Advance directives H. List of other physicians I.  Hospitalizations, surgeries, and ER visits in previous 12 months J.  [place] such as  hearing and vision if needed, cognitive and depression L. Referrals, records requested, and appointments- none   In addition, I have reviewed and discussed with patient certain preventive protocols, quality metrics, and best practice recommendations. A written personalized care plan for preventive services as well as general preventive health recommendations were provided to patient.   Signed,  Denman George, LPN  Nurse Health Advisor   Nurse Notes: Patient brought in results of last colonoscopy pathology for review.  Would like to discuss referral to GI at upcoming [facility] appointment

## 2019-09-18 ENCOUNTER — Telehealth: Payer: Self-pay | Admitting: Family Medicine

## 2019-09-18 NOTE — Telephone Encounter (Signed)
Spoke to patient and advised her of notes per Dr. Rogers Blocker.  Patient verbalized understanding and will contact us back in early May to request referral to GI for colonoscopy.

## 2019-09-18 NOTE — Telephone Encounter (Signed)
Please let her know I got her colonoscopy results.   2 questions. 1) was this her first colonoscopy she ever had? If not, was the first one normal and this was the 10 year repeat?   2) her dad had colon cancer   3) since her hx of pre cancerous polyps and family history I would suggest we repeat/send to Gi at 3 years. So in may, we need to refer her. If she didn't have family history, we could go longer due to small size of polyps.   Orma Flaming, MD [place]

## 2019-09-18 NOTE — Telephone Encounter (Signed)
Left vm message for patient requesting a c/b.

## 2019-10-25 ENCOUNTER — Other Ambulatory Visit: Payer: Self-pay | Admitting: Neurology

## 2019-10-25 MED ORDER — LEVETIRACETAM 500 MG PO TABS: ORAL_TABLET | ORAL | 0 refills | Status: DC

## 2019-10-25 NOTE — Telephone Encounter (Signed)
Called the patient because we received a refill request for her Keppra. Patient has not been seen since 2019. I have informed her that in order to continue to refill medications we must see her in a visit. I was able to schedule the pt for a mychart VV for 11/13/2019 at 2:15 pm with Judson Roch, NP. Pt requested a 90 day supply be called in and I advised this time I will send a 30 day supply to make sure that she is able to keep the upcoming apt and then next refill can be corrected for 90 day supply. Pt verbalized understanding.

## 2019-11-07 ENCOUNTER — Encounter: Payer: Self-pay | Admitting: Family Medicine

## 2019-11-08 ENCOUNTER — Encounter: Payer: Self-pay | Admitting: Family Medicine

## 2019-11-09 NOTE — Telephone Encounter (Signed)
Okay to change to virtual?

## 2019-11-10 ENCOUNTER — Ambulatory Visit (INDEPENDENT_AMBULATORY_CARE_PROVIDER_SITE_OTHER): Payer: Medicare Other | Admitting: Family Medicine

## 2019-11-10 ENCOUNTER — Encounter: Payer: Self-pay | Admitting: Family Medicine

## 2019-11-10 VITALS — Temp 97.7°F | Ht 66.0 in | Wt 227.0 lb

## 2019-11-10 DIAGNOSIS — E782 Mixed hyperlipidemia: Secondary | ICD-10-CM

## 2019-11-10 DIAGNOSIS — G40209 Localization-related (focal) (partial) symptomatic epilepsy and epileptic syndromes with complex partial seizures, not intractable, without status epilepticus: Secondary | ICD-10-CM

## 2019-11-10 DIAGNOSIS — M810 Age-related osteoporosis without current pathological fracture: Secondary | ICD-10-CM

## 2019-11-10 DIAGNOSIS — E559 Vitamin D deficiency, unspecified: Secondary | ICD-10-CM

## 2019-11-10 DIAGNOSIS — M79671 Pain in right foot: Secondary | ICD-10-CM

## 2019-11-10 DIAGNOSIS — M79672 Pain in left foot: Secondary | ICD-10-CM

## 2019-11-10 NOTE — Telephone Encounter (Signed)
Called and lvm about changing appt to virtual

## 2019-11-10 NOTE — Progress Notes (Signed)
Patient: Summer [name] MRN: [id number] DOB: 06/23/1951 PCP: Orma Flaming, MD     I connected with Summer [name] on 11/10/19 at 9:38am by a video enabled telemedicine application and verified that I am speaking with the correct person using two identifiers.  Location patient: Home Location provider: [facility], Office Persons participating in this virtual visit: Casaundra Robie and Dr. Rogers Blocker   I discussed the limitations of evaluation and management by telemedicine and the availability of in person appointments. The patient expressed understanding and agreed to proceed.   Subjective:  Chief Complaint  Patient presents with  . Transitions Of Care  . Medication Refill  . Seizures  . Osteoporosis  . Hyperlipidemia  . vitamin D deficiency    HPI: The patient is a 69 y.o. female who presents today for transition of care from Dr. Juleen [name]. She has a past medical history of partial symptomatic epilepsy with complex partial seizures, OA, osteoporosis on prolia injections, hyperlipidemia and vitamin D deficiency.   Partial symptomatic epilepsy with complex partial seizures: she is followed by neurology, but I do fill her medication. She is due for neurology appointment before further refills of her keppra. Her last seizure was years ago (7-8 years) and she is well controlled on current medication. Possibly starting to wean off one of her medication.   Osteoporosis: she is currently on prolia. Last DEXA in 2018 and actually looks more like osteopenia. On calcium and vitamin D. No hx of recent fractures.   Hyperlipidemia: LDL was 126 on last check. She is on no medication. No family history of heart disease or stroke. Remote smoking history in 1976 in college and no diabetes. Not currently exercising.   The 10-year ASCVD risk score Summer Bussing DC Jr., et al., 2013) is: 8.5%  Vit. D deficiency: last checked in 07/2019. She is on daily replacement. She thinks she is on 1000IU or more of  this daily.   Bilateral foot pain: has been going on x 2years. Arch issues, etc. Did not like foot doctor she saw. ([facility]). Would like referral to another group.   Review of Systems  Constitutional: Negative for fatigue.  Eyes: Positive for visual disturbance.  Respiratory: Negative for shortness of breath.   Cardiovascular: Negative for chest pain, palpitations and leg swelling.  Gastrointestinal: Negative for abdominal pain, diarrhea, nausea and vomiting.  Musculoskeletal: Positive for arthralgias.       C/o pain and swelling in left knee  Skin: Negative for rash.  Neurological: Negative for dizziness, tremors, seizures, weakness and headaches.  Psychiatric/Behavioral: Positive for sleep disturbance.    Allergies Patient is allergic to bee venom.  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), Alcoholism ([place]), Asymptomatic varicose veins (01/11/2017), Iron deficiency anemia (01/12/2017), Lentigines (01/11/2017), Melanocytic nevi of left lower limb, including hip (01/11/2017), Melanocytic nevi of left upper limb, including shoulder (01/11/2017), Melanocytic nevi of right lower limb, including hip (01/11/2017), Melanocytic nevi of right upper limb, including shoulder (01/11/2017), Melanocytic nevi of trunk (01/11/2017), Obesity (BMI 30-39.9) (05/21/2017), Osteoporosis, Plantar fasciitis, Primary osteoarthritis, right shoulder (12/11/2016), Seizure ([place]) (01/25/2017), Sleep apnea, and Unilateral primary osteoarthritis, 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  Family History Pateint's family history includes Alcoholism in her father; Colon cancer in her father; Memory loss in her mother; Mitral valve prolapse in her mother; Osteoporosis in her  mother.  Social History Patient  reports that  she has never smoked. She has never used smokeless tobacco. She reports that she does not drink alcohol or use drugs.    Objective: Vitals:   11/10/19 0923  Temp: 97.7 F (36.5 C)  TempSrc: Temporal  Weight: 227 lb (103 kg)  Height: 5\' 6"  (1.676 m)    Body mass index is 36.64 kg/m.  Physical Exam Vitals reviewed.  Constitutional:      Appearance: Normal appearance.  HENT:     Head: Normocephalic and atraumatic.  Pulmonary:     Effort: Pulmonary effort is normal.  Neurological:     General: No focal deficit present.     Mental Status: She is alert and oriented to person, place, and time.  Psychiatric:        Mood and Affect: Mood normal.        Behavior: Behavior normal.       Assessment/plan: 1. Partial symptomatic epilepsy with complex partial seizures, not intractable, without status epilepticus ([place]) Has f/u with neurology soon. May wean off one medication. I will continue to fill for her.   2. Mixed hyperlipidemia Labs in October of this year. Reviewed her labs from 10/20. ascvd risk is 8.5%. close enough to give her time to work on diet/exercise and we can see where she is at this year.   3. Vitamin D deficiency Continue daily replacement. Recheck at next visit.   4. Age-related osteoporosis without current pathological fracture I wonder if she has only osteopenia. Have her DEXA from 2018 which I reviewed from another physician/location and it did not meet criteria, but unsure before this time. On prolia with no dexa sine starting therapy. This is ordered and will f/u on BMD from 2018. Continue calcium/vit. D and weight bearing exercises.  - DG Bone Density; Future  5. Bilateral foot pain Will place referral for her. Long history of this. She is going to look for possible ortho foot doctor and will let me know to place a referral.    No follow-ups on file.  Records requested if needed. Time spent with patient: 25 minutes, of which >50% was spent in obtaining  information about her symptoms, reviweing her previous labs, evaluations, and treatments, counseling her about her conditions (please see discussed topics above), and developing a plan to further investigate it; she had a number of questions which I addressed.    Orma Flaming, MD [place]  11/10/2019

## 2019-11-13 ENCOUNTER — Encounter: Payer: Self-pay | Admitting: Neurology

## 2019-11-13 ENCOUNTER — Telehealth (INDEPENDENT_AMBULATORY_CARE_PROVIDER_SITE_OTHER): Payer: Medicare Other | Admitting: Neurology

## 2019-11-13 ENCOUNTER — Encounter: Payer: Self-pay | Admitting: Family Medicine

## 2019-11-13 DIAGNOSIS — G40209 Localization-related (focal) (partial) symptomatic epilepsy and epileptic syndromes with complex partial seizures, not intractable, without status epilepticus: Secondary | ICD-10-CM | POA: Diagnosis not present

## 2019-11-13 NOTE — Progress Notes (Signed)
Virtual Visit via Video Note  I connected with Summer [name] on 11/13/19 at  2:15 PM EST by a video enabled telemedicine application and verified that I am speaking with the correct person using two identifiers.  Location: Patient: at her home Provider: in the office    I discussed the limitations of evaluation and management by telemedicine and the availability of in person appointments. The patient expressed understanding and agreed to proceed.  History of Present Illness: Summer [name] is a 69 year old female, seen in refer by her primary care doctor Briscoe Deutscher, for evaluation of seizure, facial paresthesia, initial evaluation was on January 18, 2018.  She had a history of osteoporosis, is taking Prolia 60 mg every 6 months  History of complex partial seizure with secondary generalization  I reviewed previous neurology evaluation from [facility] clinic by Dr.Jehi  First seizure was in the labor room at age 75 after delivery, no warning signs,  Around 2015, she started binge drink for 3-4 years, she had recurrent seizure in February 2015, patient was amnestic of the event, husband got a call from her primary care physician the patient had a seizure during her routine appointment, with tongue biting, was admitted to [facility],    CT, MRI of the brain with and without contrast showed mild supratentorium small vessel disease, EEG showed no acute abnormality, no antiepileptic medication was given, record mentions that was thought to be due to alcohol withdrawal, patient stated that she was drinking heavily, hard liquor on a daily basis, but did stop drinking for weeks prior to the event in February 2015.  Second event July 2015, was at a work picnic, then woke up on the hospital, was witnessed by a physician there that she had a seizure, loss of consciousness with tonic-clonic activity of the extremities that lasted 5 minutes, followed by 30-60 minutes post  ictal confusion, Keppra was started at 750 twice a day, later was increased to 1000 mg twice a day later, patient complains of feeling sleepy,  Third event was in [place], she was witnessed by her friend that she has transient episode of confusion,  Her medication was later changed to lamotrigine 150 mg twice a day, Keppra 500 mg twice daily, stayed on that regimen since, she is tolerating the medication well, there was no recurrent seizures.   November 13, 2014 [facility] clinic epilepsy center multihour EEG showed intermittent bilateral independent temporal slowing., support a diagnosis of bilateral temporal cortical dysfunction, no epileptiform discharge or seizure activity noted.  Today she also complains of more than 3 years history of constant right facial paresthesia, burning hypersensitivity, mild asymmetry of the face, she noticed more wrinkles in her left lower face while woke up in the morning time.  Laboratory evaluation in January 2019 showed mild elevated LDL 101, negative troponin, INR 1.01, d-dimer was mildly elevated at 0.95, negative troponin, normal BMP, CBC   UPDATE Sept 5 2019: She is doing very well, had no recurrent seizure, has quit drinking. Now taking keppra 500mg  bid and lamotrigine 150mg  bid.   We personally reviewed MRI brain w/wo in May 2019, there are chronic small vessel disease, no acute abnormalities.  EEG was normal in June 2019  Update November 13, 2019 SS: Since last seen, she remained on both Lamictal and Keppra, she was nervous to come off the Lamictal quickly, after speaking with [facility] clinic she decided to remain on dual therapy.  She has continued to do well, has  not had recurrent seizure.  She is open to coming off the Lamictal, if done slowly.  She has continued right-sided facial paresthesia, pressure to right face, some asymmetry on the right x 3 years.  Overall, has been doing well, has her grandson with her today.    Observations/Objective: Via virtual visit, is alert and oriented, speech clear and concise, follows commands, excellent historian, facial symmetry noted, no arm drift, gait and balance are intact  Assessment and Plan: 1.  Complex partial seizure with secondary generalization -Last seizure was in 2016 -Has remained on dual therapy with Keppra 500 mg twice a day, Lamictal 150 mg twice a day -When last saw Dr. Krista Blue 06/30/2018, decided to taper off Lamictal, remain on Keppra at higher dose -She is open to switching to Keppra alone, we discussed, with switch to a higher dose of Keppra 500/1000 mg x 2 weeks, then start to come off Lamictal, she will consider and let me know  -She wishes to keep all her refills through her primary doctor, Dr. Rogers Blocker, we are of course available to assist as needed -We will follow-up in 1 year or sooner if needed  Follow Up Instructions: 1 year 11/16/2020 11:15   I discussed the assessment and treatment plan with the patient. The patient was provided an opportunity to ask questions and all were answered. The patient agreed with the plan and demonstrated an understanding of the instructions.   The patient was advised to call back or seek an in-person evaluation if the symptoms worsen or if the condition fails to improve as anticipated.  I provided 15 minutes of non-face-to-face time during this encounter.  Evangeline Dakin, DNP  [facility] [address] [place], [place] [address] [phone]

## 2019-11-21 ENCOUNTER — Encounter: Payer: Self-pay | Admitting: Family Medicine

## 2019-11-23 ENCOUNTER — Other Ambulatory Visit: Payer: Self-pay | Admitting: Neurology

## 2019-11-24 ENCOUNTER — Other Ambulatory Visit: Payer: Self-pay | Admitting: Family Medicine

## 2019-11-24 ENCOUNTER — Ambulatory Visit (INDEPENDENT_AMBULATORY_CARE_PROVIDER_SITE_OTHER): Payer: Medicare Other | Admitting: Physician Assistant

## 2019-11-24 ENCOUNTER — Other Ambulatory Visit: Payer: Self-pay

## 2019-11-24 ENCOUNTER — Encounter: Payer: Self-pay | Admitting: Physician Assistant

## 2019-11-24 VITALS — BP 120/70 | HR 63 | Temp 97.4°F | Ht 66.0 in | Wt 231.0 lb

## 2019-11-24 DIAGNOSIS — T148XXA Other injury of unspecified body region, initial encounter: Secondary | ICD-10-CM

## 2019-11-24 DIAGNOSIS — G40209 Localization-related (focal) (partial) symptomatic epilepsy and epileptic syndromes with complex partial seizures, not intractable, without status epilepticus: Secondary | ICD-10-CM

## 2019-11-24 NOTE — Progress Notes (Signed)
Summer [name] is a 69 y.o. female here for a new problem.  I acted as a Education administrator for [employer], PA-C [employer], LPN  History of Present Illness:   Chief Complaint  Patient presents with  . bruise    behind left knee    HPI   Bruise Pt c/o ecchymotic area behind left knee noticed today. Monday there was a circle there that was hard but went away and now just has a bruise. She was playing with her grandchildren outside on Monday and wonders if something possibly bit her.  Denies: fevers, cough, unusual swelling in legs, pain, numbness or tingling in legs, shortness of breath  Past Medical History:  Diagnosis Date  . Adenomatous polyp of sigmoid colon 03/18/2017   Overview:  Added automatically from request for surgery [phone]  . Alcoholism ([place])    01/18/18 - no use in 5 years  . Asymptomatic varicose veins 01/11/2017  . Iron deficiency anemia 01/12/2017   Last Assessment & Plan:  Given script for ferrous sulfate and order for repeat cbc with diff and iron studies to be done in 1 month Will also refer pt back to [facility] for repeat colonoscopy and to dietician for nutritional counseling, per pt request  . Lentigines 01/11/2017  . Melanocytic nevi of left lower limb, including hip 01/11/2017  . Melanocytic nevi of left upper limb, including shoulder 01/11/2017  . Melanocytic nevi of right lower limb, including hip 01/11/2017  . Melanocytic nevi of right upper limb, including shoulder 01/11/2017  . Melanocytic nevi of trunk 01/11/2017  . Obesity (BMI 30-39.9) 05/21/2017  . Osteoporosis   . Plantar fasciitis   . 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 Seizure ([place]) 01/25/2017   LAST SEIZURE- 2004   . Sleep apnea   . Unilateral primary osteoarthritis, left knee 12/11/2016     Social History   Socioeconomic History  . Marital status: Married    Spouse name: Not on file  . Number of children: 1  . Years of education: college  . Highest education level: Bachelor's  degree (e.g., BA, AB, BS)  Occupational History  . Occupation: Retired  Tobacco Use  . Smoking status: Never Smoker  . Smokeless tobacco: Never Used  Substance and Sexual Activity  . Alcohol use: No  . Drug use: No  . Sexual activity: Never  Other Topics Concern  . Not on file  Social History Narrative   Lives at home with husband.   Right-handed.   3 cups caffeine per day.   Cares for 2 grandchildren 3 days per week    Social Determinants of Health   Financial Resource Strain:   . Difficulty of Paying Living Expenses: Not on file  Food Insecurity:   . Worried About Charity fundraiser in the Last Year: Not on file  . Ran Out of Food in the Last Year: Not on file  Transportation Needs:   . Lack of Transportation (Medical): Not on file  . Lack of Transportation (Non-Medical): Not on file  Physical Activity:   . Days of Exercise per Week: Not on file  . Minutes of Exercise per Session: Not on file  Stress:   . Feeling of Stress : Not on file  Social Connections:   . Frequency of Communication with Friends and Family: Not on file  . Frequency of Social Gatherings with Friends and Family: Not on file  . Attends Religious Services: Not on file  . Active Member of Clubs  or Organizations: Not on file  . Attends Archivist Meetings: Not on file  . Marital Status: Not on file  Intimate Partner Violence:   . Fear of Current or Ex-Partner: Not on file  . Emotionally Abused: Not on file  . Physically Abused: Not on file  . Sexually Abused: Not on file    Past Surgical History:  Procedure Laterality Date  . BARIATRIC SURGERY    . ECTOPIC PREGNANCY SURGERY    . 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 Mitral valve prolapse Mother   . Osteoporosis Mother   . Memory  loss Mother   . Colon cancer Father   . Alcoholism Father     Allergies  Allergen Reactions  . Bee Venom Swelling    Current Medications:   Current Outpatient Medications:  .  Acetaminophen (TYLENOL PO), Take by mouth as needed., Disp: , Rfl:  .  Calcium-Vitamin D-Vitamin K [id number] MG-UNT-MCG TABS, Take 1 tablet by mouth 2 (two) times daily. Also includes Vitamin C, Disp: , Rfl:  .  Cholecalciferol (SM VITAMIN D3) 4000 units CAPS, Take 4,000 Units by mouth daily., Disp: , Rfl:  .  denosumab (PROLIA) 60 MG/ML SOLN injection, Inject 60 mg into the skin every 6 (six) months. Administer in upper arm, thigh, or abdomen, Disp: , Rfl:  .  EPINEPHrine 0.3 mg/0.3 mL IJ SOAJ injection, Inject 0.3 mLs (0.3 mg total) into the muscle as needed for anaphylaxis., Disp: 2 each, Rfl: 1 .  ferrous sulfate (FERROUSUL) 325 (65 FE) MG tablet, Take 1 tablet (325 mg total) 3 (three) times daily with meals by mouth. (Patient taking differently: Take 325 mg by mouth daily with breakfast. ), Disp: , Rfl: 3 .  lamoTRIgine (LAMICTAL) 150 MG tablet, TAKE ONE TABLET BY MOUTH TWICE A DAY, Disp: 180 tablet, Rfl: 0 .  levETIRAcetam (KEPPRA) 500 MG tablet, TAKE ONE TABLET BY MOUTH EVERY MORNING AND TAKE TWO TABLETS BY MOUTH EVERY NIGHT AT BEDTIME (Patient taking differently: TAKE ONE TABLET BY MOUTH EVERY MORNING AND TAKE  ONE TABLET BY MOUTH EVERY NIGHT AT BEDTIME), Disp: 90 tablet, Rfl: 0 .  Omega-3 Fatty Acids (OMEGA 3 PO), Take 1 capsule by mouth daily., Disp: , Rfl:  .  vitamin E 600 UNIT capsule, Take 600 Units by mouth daily., Disp: , Rfl:    Review of Systems:   ROS Negative unless otherwise specified per HPI.  Vitals:   Vitals:   11/24/19 1332  BP: 120/70  Pulse: 63  Temp: (!) 97.4 F (36.3 C)  TempSrc: Temporal  SpO2: 97%  Weight: 231 lb (104.8 kg)  Height: 5\' 6"  (1.676 m)     Body mass index is 37.28 kg/m.  Physical Exam:   Physical Exam Vitals and nursing note reviewed.   Constitutional:      General: She is not in acute distress.    Appearance: She is well-developed. She is not ill-appearing or toxic-appearing.  Cardiovascular:     Rate and Rhythm: Normal rate and regular rhythm.     Pulses: Normal pulses.     Heart sounds: Normal heart sounds, S1 normal and S2 normal.     Comments: No LE edema Pulmonary:     Effort: Pulmonary effort is normal.     Breath sounds: Normal  breath sounds.  Skin:    General: Skin is warm and dry.     Comments: Approximately 2 inch half-circle area of ecchymosis adjacent to torturous varicose vein  No calf tenderness/swelling/erythema  Neurological:     Mental Status: She is alert.     GCS: GCS eye subscore is 4. GCS verbal subscore is 5. GCS motor subscore is 6.  Psychiatric:        Speech: Speech normal.        Behavior: Behavior normal. Behavior is cooperative.        Assessment and Plan:   Juleanna was seen today for bruise.  Diagnoses and all orders for this visit:  Bruise   Suspect mild bruise from some level of trauma, very mild since patient does not remember.  No red flags on my exam.  We reviewed worsening precautions.  Anticipate symptoms will improve with time.  Patient verbalized understanding to plan is agreement.  . Reviewed expectations re: course of current medical issues. . Discussed self-management of symptoms. . Outlined signs and symptoms indicating need for more acute intervention. . Patient verbalized understanding and all questions were answered. . See orders for this visit as documented in the electronic medical record. . Patient received an After-Visit Summary.  CMA or LPN served as scribe during this visit. History, Physical, and Plan performed by medical provider. The above documentation has been reviewed and is accurate and complete.  Inda Coke, PA-C

## 2019-11-24 NOTE — Patient Instructions (Signed)
It was great to see you!  Keep an eye on the area, I think everything will continue to improve, but let us know if not!   Contusion A contusion is a deep bruise. This is a result of an injury that causes bleeding under the skin. Symptoms of bruising include pain, swelling, and discolored skin. The skin may turn blue, purple, or yellow. Follow these instructions at home: Managing pain, stiffness, and swelling You may use RICE. This stands for:  Resting.  Icing.  Compression, or putting pressure.  Elevating, or raising the injured area. To follow this method, do these actions:  Rest the injured area.  If told, put ice on the injured area. ? Put ice in a plastic bag. ? Place a towel between your skin and the bag. ? Leave the ice on for 20 minutes, 2-3 times per day.  If told, put light pressure (compression) on the injured area using an elastic bandage. Make sure the bandage is not too tight. If the area tingles or becomes numb, remove it and put it back on as told by your doctor.  If possible, raise (elevate) the injured area above the level of your heart while you are sitting or lying down.  General instructions  Take over-the-counter and prescription medicines only as told by your doctor.  Keep all follow-up visits as told by your doctor. This is important. Contact a doctor if:  Your symptoms do not get better after several days of treatment.  Your symptoms get worse.  You have trouble moving the injured area. Get help right away if:  You have very bad pain.  You have a loss of feeling (numbness) in a hand or foot.  Your hand or foot turns pale or cold. Summary  A contusion is a deep bruise. This is a result of an injury that causes bleeding under the skin.  Symptoms of bruising include pain, swelling, and discolored skin. The skin may turn blue, purple, or yellow.  This condition is treated with rest, ice, compression, and elevation. This is also called RICE.  You may be given over-the-counter medicines for pain.  Contact a doctor if you do not feel better, or you feel worse. Get help right away if you have very bad pain, have lost feeling in a hand or foot, or the area turns pale or cold. This information is not intended to replace advice given to you by your health care provider. Make sure you discuss any questions you have with your health care provider. Document Revised: 06/03/2018 Document Reviewed: 06/03/2018 Elsevier Patient Education  [place].

## 2019-11-26 ENCOUNTER — Other Ambulatory Visit: Payer: Self-pay | Admitting: Neurology

## 2019-11-27 ENCOUNTER — Other Ambulatory Visit: Payer: Self-pay | Admitting: Family Medicine

## 2019-12-04 ENCOUNTER — Ambulatory Visit: Payer: Medicare Other | Attending: Family Medicine

## 2019-12-04 DIAGNOSIS — Z23 Encounter for immunization: Secondary | ICD-10-CM | POA: Insufficient documentation

## 2019-12-04 NOTE — Progress Notes (Signed)
   Covid-19 Vaccination Clinic  Name:  Summer [name]    MRN: [id number] DOB: 1951/07/27  12/04/2019  [name] was observed post Covid-19 immunization for 15 minutes without incidence. She was provided with Vaccine Information Sheet and instruction to access the V-Safe system.   [name] was instructed to call 911 with any severe reactions post vaccine: Marland Kitchen Difficulty breathing  . Swelling of your face and throat  . A fast heartbeat  . A bad rash all over your body  . Dizziness and weakness    Immunizations Administered    Name Date Dose VIS Date Route   Pfizer COVID-19 Vaccine 12/04/2019  5:46 PM 0.3 mL 10/06/2019 Intramuscular   Manufacturer: [place]   Lot: [id number]   [place]: [id number]

## 2019-12-05 ENCOUNTER — Ambulatory Visit: Payer: Medicare Other

## 2019-12-08 ENCOUNTER — Other Ambulatory Visit: Payer: Self-pay | Admitting: Family Medicine

## 2019-12-08 DIAGNOSIS — G40209 Localization-related (focal) (partial) symptomatic epilepsy and epileptic syndromes with complex partial seizures, not intractable, without status epilepticus: Secondary | ICD-10-CM

## 2019-12-08 DIAGNOSIS — M810 Age-related osteoporosis without current pathological fracture: Secondary | ICD-10-CM

## 2019-12-27 NOTE — Progress Notes (Signed)
I have reviewed and agreed above plan. 

## 2019-12-29 ENCOUNTER — Ambulatory Visit: Payer: Medicare Other | Attending: Internal Medicine

## 2019-12-29 DIAGNOSIS — Z23 Encounter for immunization: Secondary | ICD-10-CM | POA: Insufficient documentation

## 2019-12-29 NOTE — Progress Notes (Signed)
   Covid-19 Vaccination Clinic  Name:  Summer [name]    MRN: [id number] DOB: Mar 13, 1951  12/29/2019  [name] was observed post Covid-19 immunization for 15 minutes without incident. She was provided with Vaccine Information Sheet and instruction to access the V-Safe system.   [name] was instructed to call 911 with any severe reactions post vaccine: Marland Kitchen Difficulty breathing  . Swelling of face and throat  . A fast heartbeat  . A bad rash all over body  . Dizziness and weakness   Immunizations Administered    Name Date Dose VIS Date Route   Pfizer COVID-19 Vaccine 12/29/2019  5:50 PM 0.3 mL 10/06/2019 Intramuscular   Manufacturer: [place]   Lot: [id number]   [place]: [id number]

## 2020-01-02 ENCOUNTER — Telehealth: Payer: Self-pay

## 2020-01-02 NOTE — Telephone Encounter (Signed)
PA submitted via covermymeds and faxed to pharmacy # [phone]. (Key [id number])

## 2020-01-03 NOTE — Telephone Encounter (Signed)
See below

## 2020-01-03 NOTE — Telephone Encounter (Signed)
Math calling from [facility] regarding prolia.Rep have some additional prolia questions . Best contact number [phone]

## 2020-01-04 ENCOUNTER — Telehealth: Payer: Self-pay

## 2020-01-04 NOTE — Telephone Encounter (Signed)
Spoke with the 'pharmacy' side of [facility] and was notified that I need to to contact the medical side of prolia since this is buy and bill at [phone].

## 2020-01-22 NOTE — Telephone Encounter (Signed)
Error

## 2020-02-22 ENCOUNTER — Ambulatory Visit
Admission: RE | Admit: 2020-02-22 | Discharge: 2020-02-22 | Disposition: A | Discharge status: Home / Self Care | Payer: Medicare Other | Source: Ambulatory Visit | Attending: Family Medicine | Admitting: Family Medicine

## 2020-02-22 ENCOUNTER — Other Ambulatory Visit: Payer: Self-pay

## 2020-02-22 ENCOUNTER — Encounter: Payer: Self-pay | Admitting: Family Medicine

## 2020-02-22 DIAGNOSIS — M810 Age-related osteoporosis without current pathological fracture: Secondary | ICD-10-CM

## 2020-03-28 ENCOUNTER — Encounter: Payer: Self-pay | Admitting: Family Medicine

## 2020-03-28 ENCOUNTER — Telehealth: Payer: Medicare Other | Admitting: Physician Assistant

## 2020-03-28 DIAGNOSIS — R8290 Unspecified abnormal findings in urine: Secondary | ICD-10-CM

## 2020-03-28 NOTE — Progress Notes (Signed)
Based on what you shared with me, I feel your condition warrants further evaluation and I recommend that you be seen for a face to face office visit.  A yeast infection and urinary tract infection have 2 very different causes and are treated very differently.  To help [place] determine which one you have, we need to look at a sample of your urine.  You may make an appointment with your PCP or see and urgent care to have this completed.     NOTE: If you entered your credit card information for this eVisit, you will not be charged. You may see a "hold" on your card for the $35 but that hold will drop off and you will not have a charge processed.   If you are having a true medical emergency please call 911.      For an urgent face to face visit, [place] has five urgent care centers for your convenience:      NEW:  [facility] [place] [facility] [place] Get Driving Directions [id number] [address], [place] [address] . 10 am - 6pm Monday - Friday    [place] [facility]) Get Driving Directions [id number] [address] [place], [place] [address] . 10 am to 8 pm Monday-Friday . 12 pm to 8 pm [facility] at [facility] Get Driving Directions [id number] [address], [place], [place] [address] . 8 am to 8 pm Monday-Friday . 9 am to 6 pm Saturday . 11 am to 6 pm Sunday     [facility] at [facility] Get Driving Directions  [id number] [address], [place] [address] . 8 am to 8 pm Monday-Friday . 8 am to 4 pm [facility] [place] Get Driving Directions [id number] [place], [place] [address] . 12 pm to 6 pm Monday-Friday      Your e-visit answers were reviewed by a board certified advanced clinical practitioner to complete your personal care plan.  Thank you for using  e-Visits.   Greater than 5 minutes, yet less than 10 minutes of time have been spent researching, coordinating, and implementing care for this patient today

## 2020-03-29 ENCOUNTER — Ambulatory Visit: Payer: Medicare Other | Admitting: Family Medicine

## 2020-03-29 ENCOUNTER — Encounter: Payer: Self-pay | Admitting: Family Medicine

## 2020-03-29 ENCOUNTER — Other Ambulatory Visit (HOSPITAL_COMMUNITY)
Admission: RE | Admit: 2020-03-29 | Discharge: 2020-03-29 | Disposition: A | Discharge status: Home / Self Care | Payer: Medicare Other | Source: Ambulatory Visit | Attending: Family Medicine | Admitting: Family Medicine

## 2020-03-29 ENCOUNTER — Other Ambulatory Visit: Payer: Self-pay

## 2020-03-29 VITALS — BP 124/80 | HR 72 | Temp 97.3°F | Ht 66.0 in | Wt 231.0 lb

## 2020-03-29 DIAGNOSIS — R350 Frequency of micturition: Secondary | ICD-10-CM | POA: Diagnosis not present

## 2020-03-29 DIAGNOSIS — R8290 Unspecified abnormal findings in urine: Secondary | ICD-10-CM | POA: Insufficient documentation

## 2020-03-29 DIAGNOSIS — F5102 Adjustment insomnia: Secondary | ICD-10-CM | POA: Diagnosis not present

## 2020-03-29 DIAGNOSIS — F419 Anxiety disorder, unspecified: Secondary | ICD-10-CM

## 2020-03-29 LAB — POCT URINALYSIS DIPSTICK
Bilirubin, UA: NEGATIVE
Blood, UA: NEGATIVE
Glucose, UA: NEGATIVE
Ketones, UA: NEGATIVE
Leukocytes, UA: NEGATIVE
Nitrite, UA: NEGATIVE
Protein, UA: NEGATIVE
Spec Grav, UA: 1.015 (ref 1.010–1.025)
Urobilinogen, UA: 1 E.U./dL
pH, UA: 7.5 (ref 5.0–8.0)

## 2020-03-29 NOTE — Telephone Encounter (Signed)
Pt seen in office today.

## 2020-03-29 NOTE — Patient Instructions (Signed)
1) for sleep, lets try sleep hygiene first.  -no caffiene after 2 pm -exercise in sun/get outside -no screen time 30 minutes before bed -no naps after 2 pm -bed is for sex and sleep only, don't do other things in it.   -lavender oil has been shown to help -do a trial of ashwagandha. Take 300mg  30 minutes- 1 hour before bed  -melantonin as well.    I would do this for a month then email me. If doesn't work, look into trazodone. ( this is prescription)    For anxiety -lets do counseling/exercise first -yoga is also very helpful  -if this is not controlling symptoms then we will need to start medication.  -we can f/u in 3 months.

## 2020-03-29 NOTE — Progress Notes (Signed)
Patient: Summer [name] MRN: [id number] DOB: [birth year] PCP: Orma Flaming, MD     Subjective:  Chief Complaint  Patient presents with  . Insomnia  . Urinary Frequency  . Referral    grief counseling    HPI: The patient is a 69 y.o. female who presents today for Sleep issues, urinary frequency, and a referral for grief counseling.   Urinary frequency She states symptoms started about 2-3 weeks ago. She does drink a lot of water and coffee. She has no dysuria or pain. She has no urgency. She denies any polyphagia or polydipsia.  She does have an odor to the urine. No blood in the urine and no color change to the urine. She denies any vaginal discharge. No CVA tenderness/chills/fevers. She has not changed her diet or her supplements.   She would also like a referral for grief counseling after her mother passed away alone during covid.   Insomnia She started to have symptoms in December during her mothers birthday month, about 6 months after her mother died. She states the insomnia was gradual. She will wake up thinking about things with her mom, during the day, past events. She has a problem both falling asleep and staying asleep. She gets on average about 6 hours of sleep at night. She does drink caffeine after 2pm. No naps after 2pm. [facility] She does watch TV in bed and does watch Tv/screen time before sleep.   Review of Systems  Cardiovascular: Negative for chest pain and palpitations.  Gastrointestinal: Negative for abdominal pain, nausea and vomiting.  Genitourinary: Positive for frequency. Negative for pelvic pain and urgency.  Neurological: Negative for dizziness, light-headedness and headaches.    Allergies Patient is allergic to bee venom.  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), Alcoholism ([place]), Asymptomatic varicose veins (01/11/2017), Iron deficiency  anemia (01/12/2017), Lentigines (01/11/2017), Melanocytic nevi of left lower limb, including hip (01/11/2017), Melanocytic nevi of left upper limb, including shoulder (01/11/2017), Melanocytic nevi of right lower limb, including hip (01/11/2017), Melanocytic nevi of right upper limb, including shoulder (01/11/2017), Melanocytic nevi of trunk (01/11/2017), Obesity (BMI 30-39.9) (05/21/2017), Osteoporosis, Plantar fasciitis, Primary osteoarthritis, 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  Family History Pateint's family history includes Alcoholism in her father; Colon cancer in her father; Memory loss in her mother; Mitral valve prolapse in her mother; Osteoporosis in her mother.  Social History Patient  reports that she has never smoked. She has never used smokeless tobacco. She reports that she does not drink alcohol or use drugs.    Objective: Vitals:   03/29/20 0845  BP: 124/80  Pulse: 72  Temp: (!) 97.3 F (36.3 C)  TempSrc: Temporal  SpO2: 98%  Weight: 231 lb (104.8 kg)  Height: 5\' 6"  (1.676 m)    Body mass index is 37.28 kg/m.  Physical Exam Vitals reviewed.  Constitutional:      Appearance: Normal appearance. She is obese.  HENT:     Head: Normocephalic and atraumatic.  Cardiovascular:     Rate and Rhythm: Normal rate and regular rhythm.     Heart sounds: Normal heart sounds.  Pulmonary:     Effort: Pulmonary effort is normal.  Breath sounds: Normal breath sounds.  Abdominal:     General: Bowel sounds are normal. There is no distension.     Palpations: Abdomen is soft.     Tenderness: There is no abdominal tenderness. There is no right CVA tenderness, left CVA tenderness or guarding.  Skin:    Capillary Refill: Capillary  refill takes less than 2 seconds.  Neurological:     General: No focal deficit present.     Mental Status: She is alert and oriented to person, place, and time.  Psychiatric:        Mood and Affect: Mood normal.        Behavior: Behavior normal.    GAD 7 : Generalized Anxiety Score 03/29/2020  Nervous, Anxious, on Edge 2  Control/stop worrying 2  Worry too much - different things 1  Trouble relaxing 3  Restless 0  Easily annoyed or irritable 2  Afraid - awful might happen 3  Total GAD 7 Score 13        Assessment/p   Office Visit from 03/29/2020 in [facility]  PHQ-2 Total Score  0     plan: 1. Foul smelling urine UA with no abnormalities. Will check for BV. Culture pending on urine - Cervicovaginal ancillary only  2. Frequency of urination ua normal, culture pending. No sugar in urine. No polyphagia/polydipsia. Other thoughts could be a prolapse. If culture negative can do exam at f/u.  - POCT Urinalysis Dipstick - Urine Culture; Future  3. Anxiety GAD7 score is moderately elevated. Discussed treatment options and we are going to start with counseling first. If this and other lifestyle modifications do not help then we can discuss medication. Will have her f/u in 3 months.   4. Adjustment insomnia Sleep hygiene discussed. Will have her practice this for 3 months and do trial of ashwagandha. If these fail, then consider trazodone. See her back in 3 months.    This visit occurred during the SARS-CoV-2 public health emergency.  Safety protocols were in place, including screening questions prior to the visit, additional usage of staff PPE, and extensive cleaning of exam room while observing appropriate contact time as indicated for disinfecting solutions.    Return in about 3 months (around 06/29/2020) for anxiety/sleep .    Orma Flaming, MD [place]   03/29/2020

## 2020-04-01 LAB — CERVICOVAGINAL ANCILLARY ONLY
Bacterial Vaginitis (gardnerella): NEGATIVE
Candida Glabrata: NEGATIVE
Candida Vaginitis: NEGATIVE
Comment: NEGATIVE
Comment: NEGATIVE
Comment: NEGATIVE

## 2020-04-30 ENCOUNTER — Encounter: Payer: Self-pay | Admitting: Family Medicine

## 2020-05-02 ENCOUNTER — Other Ambulatory Visit: Payer: Medicare Other

## 2020-05-02 ENCOUNTER — Other Ambulatory Visit: Payer: Self-pay

## 2020-05-02 DIAGNOSIS — R350 Frequency of micturition: Secondary | ICD-10-CM

## 2020-05-03 ENCOUNTER — Encounter: Payer: Self-pay | Admitting: Family Medicine

## 2020-05-03 ENCOUNTER — Ambulatory Visit: Payer: Medicare Other | Admitting: Family Medicine

## 2020-05-03 LAB — URINE CULTURE
MICRO NUMBER:: 10680980
Result:: NO GROWTH
SPECIMEN QUALITY:: ADEQUATE

## 2020-05-28 ENCOUNTER — Other Ambulatory Visit: Payer: Self-pay | Admitting: Family Medicine

## 2020-05-28 DIAGNOSIS — G40209 Localization-related (focal) (partial) symptomatic epilepsy and epileptic syndromes with complex partial seizures, not intractable, without status epilepticus: Secondary | ICD-10-CM

## 2020-05-31 ENCOUNTER — Ambulatory Visit: Payer: Medicare Other | Admitting: Family Medicine

## 2020-05-31 ENCOUNTER — Other Ambulatory Visit: Payer: Self-pay

## 2020-05-31 ENCOUNTER — Encounter: Payer: Self-pay | Admitting: Family Medicine

## 2020-05-31 ENCOUNTER — Telehealth: Payer: Self-pay | Admitting: Family Medicine

## 2020-05-31 VITALS — BP 100/58 | HR 62 | Temp 97.8°F | Ht 66.0 in | Wt 228.6 lb

## 2020-05-31 DIAGNOSIS — T63461A Toxic effect of venom of wasps, accidental (unintentional), initial encounter: Secondary | ICD-10-CM

## 2020-05-31 MED ORDER — TRIAMCINOLONE ACETONIDE 0.5 % EX CREA: 1.0000 "application " | TOPICAL_CREAM | Freq: Two times a day (BID) | CUTANEOUS | 0 refills | Status: DC

## 2020-05-31 NOTE — Patient Instructions (Addendum)
Sent in high dose steroid cream to put on it to help itching/inflammation twice a day.  Continue with benadryl every 6-8 hours as needed for itching and claritin daily.   -watch for spreading redness.   Bee, Wasp, or [employer], Adult Bees, wasps, and hornets are part of a family of insects that can sting people. These stings can cause pain and inflammation, but they are usually not serious. However, some people may have an allergic reaction to a sting. This can cause the symptoms to be more severe. What increases the risk? You may be at a greater risk of getting stung if you:  Provoke a stinging insect by swatting or disturbing it.  Wear strong-smelling soaps, deodorants, or body sprays.  Spend time outdoors near gardens with flowers or fruit trees or in clothes that expose skin.  Eat or drink outside. What are the signs or symptoms? Common symptoms of this condition include:  A red lump in the skin that sometimes has a tiny hole in the center. In some cases, a stinger may be in the center of the wound.  Pain and itching at the sting site.  Redness and swelling around the sting site. If you have an allergic reaction (localized allergic reaction), the swelling and redness may spread out from the sting site. In some cases, this reaction can continue to develop over the next 24-48 hours. In rare cases, a person may have a severe allergic reaction (anaphylactic reaction) to a sting. Symptoms of an anaphylactic reaction may include:  Wheezing or difficulty breathing.  Raised, itchy, red patches on the skin (hives).  Nausea or vomiting.  Abdominal cramping.  Diarrhea.  Tightness in the chest or chest pain.  Dizziness or fainting.  Redness of the face (flushing).  Hoarse voice.  Swollen tongue, lips, or face. How is this diagnosed? This condition is usually diagnosed based on your symptoms and medical history as well as a physical exam. You may have an allergy test to  determine if you are allergic to the substance that the insect injected during the sting (venom). How is this treated? If you were stung by a bee, the stinger and a small sac of venom may be in the wound. It is important to remove the stinger as soon as possible. You can do this by brushing across the wound with gauze, a fingernail, or a flat card such as a credit card. Removing the stinger can help reduce the severity of your body's reaction to the sting. Most stings can be treated with:  Icing to reduce swelling in the area.  Medicines (antihistamines) to treat itching or an allergic reaction.  Medicines to help reduce pain. These may be medicines that you take by mouth, or medicated creams or lotions that you apply to your skin. Pay close attention to your symptoms after you have been stung. If possible, have someone stay with you to make sure you do not have an allergic reaction. If you have any signs of an allergic reaction, call your health care provider. If you have ever had a severe allergic reaction, your health care provider may give you an inhaler or injectable medicine (epinephrine auto-injector) to use if necessary. Follow these instructions at home:   Wash the sting site 2-3 times each day with soap and water as told by your health care provider.  Apply or take over-the-counter and prescription medicines only as told by your health care provider.  If directed, apply ice to the sting area. ?  Put ice in a plastic bag. ? Place a towel between your skin and the bag. ? Leave the ice on for 20 minutes, 2-3 times a day.  Do not scratch the sting area.  If you had a severe allergic reaction to a sting, you may need: ? To wear a medical bracelet or necklace that lists the allergy. ? To learn when and how to use an anaphylaxis kit or epinephrine injection. Your family members and coworkers may also need to learn this. ? To carry an anaphylaxis kit or epinephrine injection with you at  all times. How is this prevented?  Avoid swatting at stinging insects and disturbing insect nests.  Do not use fragrant soaps or lotions.  Wear shoes, pants, and long sleeves when spending time outdoors, especially in grassy areas where stinging insects are common.  Keep outdoor areas free from nests or hives.  Keep food and drink containers covered when eating outdoors.  Avoid working or sitting near [employer], if possible.  Wear gloves if you are gardening or working outdoors.  If an attack by a stinging insect or a swarm seems likely in the moment, move away from the area or find a barrier between you and the insect(s), such as a door. Contact a health care provider if:  Your symptoms do not get better in 2-3 days.  You have redness, swelling, or pain that spreads beyond the area of the sting.  You have a fever. Get help right away if: You have symptoms of a severe allergic reaction. These include:  Wheezing or difficulty breathing.  Tightness in the chest or chest pain.  Light-headedness or fainting.  Itchy, raised, red patches on the skin.  Nausea or vomiting.  Abdominal cramping.  Diarrhea.  A swollen tongue or lips, or trouble swallowing.  Dizziness or fainting. Summary  Stings from bees, wasps, and hornets can cause pain and inflammation, but they are usually not serious. However, some people may have an allergic reaction to a sting. This can cause the symptoms to be more severe.  Pay close attention to your symptoms after you have been stung. If possible, have someone stay with you to make sure you do not have an allergic reaction.  Call your health care provider if you have any signs of an allergic reaction. This information is not intended to replace advice given to you by your health care provider. Make sure you discuss any questions you have with your health care provider. Document Revised: 10/07/2017 Document Reviewed: 12/17/2016 Elsevier  Patient Education  2020 [employer].

## 2020-05-31 NOTE — Telephone Encounter (Signed)
Nurse Assessment Nurse: Altamease Oiler, RN, Adriana Date/Time (Eastern Time): 05/30/2020 4:38:38 PM Confirm and document reason for call. If symptomatic, describe symptoms. ---pt states she was stung by wasp on Tuesday, has been using claritin-d and calamine lotion. area doesn't seem to be getting better. it is hot. was stung on left side of leg, swelling is about 11", redness is about 3-4". pain of 1/10 Has the patient had close contact with a person known or suspected to have the novel coronavirus illness OR traveled / lives in area with major community spread (including international travel) in the last 14 days from the onset of symptoms? * If Asymptomatic, screen for exposure and travel within the last 14 days. ---No Does the patient have any new or worsening symptoms? ---Yes Will a triage be completed? ---Yes Related visit to physician within the last 2 weeks? ---No Does the PT have any chronic conditions? (i.e. diabetes, asthma, this includes High risk factors for pregnancy, etc.) ---Yes List chronic conditions. ---epilepsy Is this a behavioral health or substance abuse call? ---NoPLEASE NOTE: All timestamps contained within this report are represented as [place] Standard Time. CONFIDENTIALTY NOTICE: This fax transmission is intended only for the addressee. It contains information that is legally privileged, confidential or otherwise protected from use or disclosure. If you are not the intended recipient, you are strictly prohibited from reviewing, disclosing, copying using or disseminating any of this information or taking any action in reliance on or regarding this information. If you have received this fax in error, please notify us immediately by telephone so that we can arrange for its return to [place]. Phone: [phone], Toll-Free: [phone], Fax: [phone] Page: 2 of 2 Call Id: [id number] Guidelines Guideline Title Affirmed Question Affirmed Notes Nurse Date/Time Eilene Ghazi Time) Bee  or Yellow Jacket Sting Swelling is huge (e.g., > 4 inches or 10 cm, spreads beyond wrist or ankle) Altamease Oiler, RN, Adriana 05/30/2020 4:41:59 PM Disp. Time Eilene Ghazi Time) Disposition Final User 05/30/2020 4:45:03 PM See PCP within 24 Hours Yes Altamease Oiler, RN, Fabio Bering Caller Disagree/Comply Comply Caller Understands Yes PreDisposition Call Doctor Care Advice Given Per Guideline SEE PCP WITHIN 24 HOURS: * IF OFFICE WILL BE OPEN: You need to be examined within the next 24 hours. Call your doctor (or NP/PA) when the office opens and make an appointment. ANTIHISTAMINE (E.G., BENADRYL) FOR SWELLING AND ITCHING: * Diphenhydramine (Benadryl) is a good choice. The adult dosage of Benadryl is 25-50 mg by mouth and you can take it up to 4 times a day. PAIN MEDICINES FOR STING: * ACETAMINOPHEN - REGULAR STRENGTH TYLENOL: Take 650 mg (two 325 mg pills) by mouth. * IBUPROFEN (E.G., MOTRIN, ADVIL): Take 400 mg (two 200 mg pills) by mouth. CALL BACK IF: * Fever occurs * You become worse. CARE ADVICE given per Bee or Yellow Jacket Sting (Adult) guideline. Comments User: Kizzie Fantasia, RN Date/Time Eilene Ghazi Time): 05/30/2020 4:45:40 PM pt has appt at 1300 tomorrow Referrals REFERRED TO PCP OFFICE

## 2020-05-31 NOTE — Progress Notes (Signed)
Patient: Summer [name] MRN: [id number] DOB: [birth year] PCP: Orma Flaming, MD     Subjective:  Chief Complaint  Patient presents with  . Insect Bite    HPI: The patient is a 69 y.o. female who presents today for Wasp sting. She was stung on Tuesday. She was playing baseball with her grandson and was stung by a wasp. She has some redness and swelling in legs. She is taking Claritin, and has used to an ice pack. She used an ibuprofen and tylenol last night. She also has started on benadryl. She says that she had a temp of 99.7 in her leg. She said that she has also used calamine lotion for itching. She had a previous allergy to bees, but has not had any throat swelling/shortness of breath/stridor.  Localized to her leg where sting occurred. Also believes they removed stinger immediately. States it is better today with decreased redness.     Review of Systems  Constitutional: Negative for chills and fever.  Respiratory: Negative for shortness of breath and wheezing.   Cardiovascular: Positive for leg swelling. Negative for chest pain and palpitations.  Skin: Positive for color change and rash.    Allergies Patient is allergic to bee venom.  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), Alcoholism ([place]), Asymptomatic varicose veins (01/11/2017), Iron deficiency anemia (01/12/2017), Lentigines (01/11/2017), Melanocytic nevi of left lower limb, including hip (01/11/2017), Melanocytic nevi of left upper limb, including shoulder (01/11/2017), Melanocytic nevi of right lower limb, including hip (01/11/2017), Melanocytic nevi of right upper limb, including shoulder (01/11/2017), Melanocytic nevi of trunk (01/11/2017), Obesity (BMI 30-39.9) (05/21/2017), Osteoporosis, Plantar fasciitis, Primary osteoarthritis, 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  Family History Pateint's family history includes Alcoholism in her father; Colon cancer in her father; Memory loss in her mother; Mitral valve prolapse in her mother; Osteoporosis in her mother.  Social History Patient  reports that she has never smoked. She has never used smokeless tobacco. She reports that she does not drink alcohol and does not use drugs.    Objective: Vitals:   05/31/20 1302  Temp: 97.8 F (36.6 C)  TempSrc: Temporal  Weight: 228 lb 9.6 oz (103.7 kg)  Height: 5\' 6"  (1.676 m)    Body mass index is 36.9 kg/m.  Physical Exam Vitals reviewed.  Constitutional:      Appearance: Normal appearance. She is obese.  HENT:     Head: Normocephalic and atraumatic.  Pulmonary:     Effort: Pulmonary effort is normal.     Breath sounds: No stridor.  Skin:    Findings: Erythema (left lower lateral leg: area of erythema and induration about 3+cm in diameter. slightly warm to touch ) present.  Neurological:     Mental Status: She is alert.        Assessment/plan: 1. Wasp sting, accidental or unintentional, initial encounter No evidence of cellulitis. Continue with cool compresses, benadryl prn, cetrizine. Will give her some topical steroid cream to put on BID to help with itching as well. Discussed not to use beyond 7-10 days. Precautions given for spreading redness/warmth/fever etc to let me know immediately.    This visit occurred during the SARS-CoV-2 public health emergency.  Safety protocols were in place, including screening questions prior  to the visit, additional usage of staff PPE, and extensive cleaning of exam room while observing appropriate contact time as indicated for disinfecting solutions.      Return if symptoms worsen or fail to improve.    Orma Flaming, MD [place]   05/31/2020

## 2020-06-01 ENCOUNTER — Encounter: Payer: Self-pay | Admitting: Neurology

## 2020-06-06 ENCOUNTER — Other Ambulatory Visit: Payer: Self-pay | Admitting: Family Medicine

## 2020-06-06 ENCOUNTER — Encounter: Payer: Self-pay | Admitting: Family Medicine

## 2020-06-06 MED ORDER — CEPHALEXIN 500 MG PO CAPS: 500.0000 mg | ORAL_CAPSULE | Freq: Three times a day (TID) | ORAL | 0 refills | Status: DC

## 2020-06-06 MED ORDER — TRIAMCINOLONE ACETONIDE 0.5 % EX CREA: 1.0000 "application " | TOPICAL_CREAM | Freq: Two times a day (BID) | CUTANEOUS | 0 refills | Status: DC

## 2020-06-11 ENCOUNTER — Other Ambulatory Visit: Payer: Medicare Other

## 2020-06-11 ENCOUNTER — Other Ambulatory Visit: Payer: Self-pay

## 2020-06-11 DIAGNOSIS — Z20822 Contact with and (suspected) exposure to covid-19: Secondary | ICD-10-CM

## 2020-06-12 LAB — SARS-COV-2, NAA 2 DAY TAT

## 2020-06-12 LAB — NOVEL CORONAVIRUS, NAA: SARS-CoV-2, NAA: NOT DETECTED

## 2020-07-08 ENCOUNTER — Other Ambulatory Visit: Payer: Self-pay | Admitting: Family Medicine

## 2020-07-08 DIAGNOSIS — Z1231 Encounter for screening mammogram for malignant neoplasm of breast: Secondary | ICD-10-CM

## 2020-07-23 ENCOUNTER — Encounter: Payer: Self-pay | Admitting: Family Medicine

## 2020-07-29 ENCOUNTER — Ambulatory Visit: Payer: Medicare Other | Admitting: Family Medicine

## 2020-07-31 ENCOUNTER — Ambulatory Visit
Admission: RE | Admit: 2020-07-31 | Discharge: 2020-07-31 | Disposition: A | Discharge status: Home / Self Care | Payer: Medicare Other | Source: Ambulatory Visit | Attending: Family Medicine | Admitting: Family Medicine

## 2020-07-31 ENCOUNTER — Other Ambulatory Visit: Payer: Self-pay

## 2020-07-31 DIAGNOSIS — Z1231 Encounter for screening mammogram for malignant neoplasm of breast: Secondary | ICD-10-CM

## 2020-08-05 ENCOUNTER — Ambulatory Visit: Payer: Medicare Other | Admitting: Family Medicine

## 2020-08-05 ENCOUNTER — Other Ambulatory Visit: Payer: Self-pay

## 2020-08-05 ENCOUNTER — Encounter: Payer: Self-pay | Admitting: Family Medicine

## 2020-08-05 VITALS — BP 104/69 | HR 59 | Temp 98.0°F | Ht 66.0 in | Wt 219.6 lb

## 2020-08-05 DIAGNOSIS — M67431 Ganglion, right wrist: Secondary | ICD-10-CM

## 2020-08-05 DIAGNOSIS — G40209 Localization-related (focal) (partial) symptomatic epilepsy and epileptic syndromes with complex partial seizures, not intractable, without status epilepticus: Secondary | ICD-10-CM | POA: Diagnosis not present

## 2020-08-05 DIAGNOSIS — D125 Benign neoplasm of sigmoid colon: Secondary | ICD-10-CM

## 2020-08-05 DIAGNOSIS — G8929 Other chronic pain: Secondary | ICD-10-CM

## 2020-08-05 DIAGNOSIS — M79671 Pain in right foot: Secondary | ICD-10-CM

## 2020-08-05 MED ORDER — LAMOTRIGINE 150 MG PO TABS: 150.0000 mg | ORAL_TABLET | Freq: Two times a day (BID) | ORAL | 3 refills | Status: DC

## 2020-08-05 NOTE — Patient Instructions (Addendum)
Try voltaren gel on your feet. Can use up to four times a day   1) referral to dr. Rush Landmark gramig for cyst on wrist  2) referral to dr. Sharol Given for foot ([facility])  2 different orthos for a reason.   3. Referral for GI due for colonoscopy!   Ganglion Cyst  A ganglion cyst is a non-cancerous, fluid-filled lump that occurs near a joint or tendon. The cyst grows out of a joint or the lining of a tendon. Ganglion cysts most often develop in the hand or wrist, but they can also develop in the shoulder, elbow, hip, knee, ankle, or foot. Ganglion cysts are ball-shaped or egg-shaped. Their size can range from the size of a pea to larger than a grape. Increased activity may cause the cyst to get bigger because more fluid starts to build up. What are the causes? The exact cause of this condition is not known, but it may be related to:  Inflammation or irritation around the joint.  An injury.  Repetitive movements or overuse.  Arthritis. What increases the risk? You are more likely to develop this condition if:  You are a woman.  You are 38-70 years old. What are the signs or symptoms? The main symptom of this condition is a lump. It most often appears on the hand or wrist. In many cases, there are no other symptoms, but a cyst can sometimes cause:  Tingling.  Pain.  Numbness.  Muscle weakness.  Weak grip.  Less range of motion in a joint. How is this diagnosed? Ganglion cysts are usually diagnosed based on a physical exam. Your health care provider will feel the lump and may shine a light next to it. If it is a ganglion cyst, the light will likely shine through it. Your health care provider may order an X-ray, ultrasound, or MRI to rule out other conditions. How is this treated? Ganglion cysts often go away on their own without treatment. If you have pain or other symptoms, treatment may be needed. Treatment is also needed if the ganglion cyst limits your movement or if it gets  infected. Treatment may include:  Wearing a brace or splint on your wrist or finger.  Taking anti-inflammatory medicine.  Having fluid drained from the lump with a needle (aspiration).  Getting a steroid injected into the joint.  Having surgery to remove the ganglion cyst.  Placing a pad on your shoe or wearing shoes that will not rub against the cyst if it is on your foot. Follow these instructions at home:  Do not press on the ganglion cyst, poke it with a needle, or hit it.  Take over-the-counter and prescription medicines only as told by your health care provider.  If you have a brace or splint: ? Wear it as told by your health care provider. ? Remove it as told by your health care provider. Ask if you need to remove it when you take a shower or a bath.  Watch your ganglion cyst for any changes.  Keep all follow-up visits as told by your health care provider. This is important. Contact a health care provider if:  Your ganglion cyst becomes larger or more painful.  You have pus coming from the lump.  You have weakness or numbness in the affected area.  You have a fever or chills. Get help right away if:  You have a fever and have any of these in the cyst area: ? Increased redness. ? Red streaks. ?  Swelling. Summary  A ganglion cyst is a non-cancerous, fluid-filled lump that occurs near a joint or tendon.  Ganglion cysts most often develop in the hand or wrist, but they can also develop in the shoulder, elbow, hip, knee, ankle, or foot.  Ganglion cysts often go away on their own without treatment. This information is not intended to replace advice given to you by your health care provider. Make sure you discuss any questions you have with your health care provider. Document Revised: 09/24/2017 Document Reviewed: 06/11/2017 Elsevier Patient Education  [employer].

## 2020-08-05 NOTE — Progress Notes (Signed)
Patient: Summer [name] MRN: [id number] DOB: 1951/05/11 PCP: Orma Flaming, MD     Subjective:  No chief complaint on file.   HPI: The patient is a 69 y.o. female who presents today for cyst on right wrist. She also requests a referral to ortho for right foot pain. She has a large bump on the anterior aspect of her distal wrist. It is not tender to touch and she has full rom in her wrist. noticed it 4 weeks ago. Not red, fixed, growing. Not painful.   Right foot pain Pain is not getting worse, but is not getting better. Has been going on over 2 years. [facility] at [facility] and got an insert and didn't like them. I did another referral and she didn't go. She was not told plantar fascitis. She can't remember what she was told that she had.   Also need refill of her medication.   Review of Systems  Constitutional: Negative for fatigue and fever.  Gastrointestinal: Negative for abdominal pain, nausea and vomiting.  Musculoskeletal: Positive for arthralgias. Negative for joint swelling.       Cyst on right wrist and right foot pain   Neurological: Negative for light-headedness and headaches.    Allergies Patient is allergic to bee venom.  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), Alcoholism ([place]), Asymptomatic varicose veins (01/11/2017), Iron deficiency anemia (01/12/2017), Lentigines (01/11/2017), Melanocytic nevi of left lower limb, including hip (01/11/2017), Melanocytic nevi of left upper limb, including shoulder (01/11/2017), Melanocytic nevi of right lower limb, including hip (01/11/2017), Melanocytic nevi of right upper limb, including shoulder (01/11/2017), Melanocytic nevi of trunk (01/11/2017), Obesity (BMI 30-39.9) (05/21/2017), Osteoporosis, Plantar fasciitis, Primary osteoarthritis, right shoulder (12/11/2016), Seizure ([place]) (01/25/2017), Sleep apnea, and Unilateral primary osteoarthritis, left knee  (12/11/2016).  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  Family History Pateint's family history includes Alcoholism in her father; Colon cancer in her father; Memory loss in her mother; Mitral valve prolapse in her mother; Osteoporosis in her mother.  Social History Patient  reports that she has never smoked. She has never used smokeless tobacco. She reports that she does not drink alcohol and does not use drugs.    Objective: Vitals:   08/05/20 1133  BP: 104/69  Pulse: (!) 59  Temp: 98 F (36.7 C)  TempSrc: Temporal  SpO2: 99%  Weight: 219 lb 9.6 oz (99.6 kg)  Height: 5\' 6"  (1.676 m)    Body mass index is 35.44 kg/m.  Physical Exam Vitals reviewed.  Constitutional:      Appearance: Normal appearance. She is obese.  HENT:     Head: Normocephalic and atraumatic.  Musculoskeletal:     Comments: Mobile small cyst at distal end of right wrist volar side.  Right foot: TTP at medial aspect of arch. No other abnormal findings.   Skin:    Capillary Refill: Capillary refill takes less than 2 seconds.  Neurological:     General: No focal deficit present.     Mental Status: She is alert and oriented to person, place, and time.  Psychiatric:        Mood and Affect: Mood normal.        Behavior: Behavior normal.        Assessment/plan: 1. Ganglion cyst of volar aspect of right wrist -referral to dr. Amedeo Plenty  - Ambulatory referral to  Orthopedic Surgery  2. Chronic pain in right foot -dr. Sharol Given referral  - Ambulatory referral to Orthopedic Surgery  3. Partial symptomatic epilepsy with complex partial seizures, not intractable, without status epilepticus (HCC) Refilled medication.  - lamoTRIgine (LAMICTAL) 150 MG tablet; Take 1 tablet (150 mg total) by mouth 2 (two) times daily.  Dispense: 180 tablet; Refill: 3  4. Adenomatous polyp of sigmoid  colon Overdue for cscope. On 3 year plan. Father with colon cancer, passed away at age 35 years. +adenomatous polyp.  - Ambulatory referral to Gastroenterology   This visit occurred during the SARS-CoV-2 public health emergency.  Safety protocols were in place, including screening questions prior to the visit, additional usage of staff PPE, and extensive cleaning of exam room while observing appropriate contact time as indicated for disinfecting solutions.    Return if symptoms worsen or fail to improve.   Orma Flaming, MD [facility]   08/05/2020

## 2020-08-22 ENCOUNTER — Ambulatory Visit: Payer: Medicare Other | Admitting: Orthopedic Surgery

## 2020-08-22 ENCOUNTER — Encounter: Payer: Self-pay | Admitting: Orthopedic Surgery

## 2020-08-22 ENCOUNTER — Ambulatory Visit: Payer: Self-pay

## 2020-08-22 VITALS — Ht 66.0 in | Wt 219.0 lb

## 2020-08-22 DIAGNOSIS — M7741 Metatarsalgia, right foot: Secondary | ICD-10-CM

## 2020-08-22 DIAGNOSIS — M79671 Pain in right foot: Secondary | ICD-10-CM

## 2020-08-22 NOTE — Progress Notes (Signed)
Office Visit Note   Patient: Summer [name]           Date of Birth: [birth year]           MRN: [id number] Visit Date: 08/22/2020              Requested by: Orma Flaming, [address],  [place] [address] PCP: Orma Flaming, MD  Chief Complaint  Patient presents with  . Right Foot - Pain      HPI: Patient is a 69 year old woman who was seen for initial evaluation for midfoot and forefoot pain on the right she states she has had symptoms for several years she has tried custom orthotics from the [facility] she has [facility] she has tried extra-depth shoes and still has pain.  Pain primarily with ambulation and activities of daily living.  Assessment & Plan: Visit Diagnoses:  1. Pain in right foot   2. Metatarsalgia, right foot     Plan: Discussed that she could continue with orthotics and a stiff soled sneaker she does have a new sneaker that has minimal flexion.  Discussed that if she fails conservative treatment surgical intervention would include a Weil osteotomy for the second and third metatarsals and a fusion at the base of the second metatarsal.  Risks and benefits of surgery were discussed patient states she understands and will continue with conservative care and will call us if she wants to proceed with outpatient surgery.  Follow-Up Instructions: Return if symptoms worsen or fail to improve.   Ortho Exam  Patient is alert, oriented, no adenopathy, well-dressed, normal affect, normal respiratory effort. Examination patient has good pulses she has tenderness to palpation beneath the second and third metatarsal heads and has callus she has good dorsiflexion of the ankle about 20 degrees past neutral.  She has a bony spur at the base of the second metatarsal and this area is tender to palpation.  She has pain reproduced with a attempted distraction across the base of the second metatarsal.  Imaging: No results found. No images are  attached to the encounter.  Labs: Lab Results  Component Value Date   REPTSTATUS 06/30/2017 FINAL 06/28/2017   CULT NO GROUP A STREP (S.PYOGENES) ISOLATED 06/28/2017     Lab Results  Component Value Date   ALBUMIN 4.4 08/18/2019    No results found for: MG Lab Results  Component Value Date   VD25OH 29.86 (L) 08/18/2019    No results found for: PREALBUMIN CBC EXTENDED Latest Ref Rng & Units 08/18/2019 04/13/2018 11/04/2017  WBC 4.0 - 10.5 K/uL 5.1 4.6 5.2  RBC 3.87 - 5.11 Mil/uL 4.60 4.83 4.61  HGB 12.0 - 15.0 g/dL 14.2 14.8 13.4  HCT 36 - 46 % 43.1 43.5 41.5  PLT 150 - 400 K/uL 230.0 247.0 253  NEUTROABS 1.4 - 7.7 K/uL 2.3 2.2 -  LYMPHSABS 0.7 - 4.0 K/uL 2.1 1.9 -     Body mass index is 35.35 kg/m.  Orders:  Orders Placed This Encounter  Procedures  . XR Foot 2 Views Right   No orders of the defined types were placed in this encounter.    Procedures: No procedures performed  Clinical Data: No additional findings.  ROS:  All other systems negative, except as noted in the HPI. Review of Systems  Objective: Vital Signs: Ht 5\' 6"  (1.676 m)   Wt 219 lb (99.3 kg)   BMI 35.35 kg/m   Specialty Comments:  No specialty comments available.  PMFS History: Patient Active Problem List   Diagnosis Date Noted  . Mixed hyperlipidemia 07/07/2019  . Vitamin D deficiency 07/07/2019  . Partial symptomatic epilepsy with complex partial seizures, not intractable, without status epilepticus ([place]) 01/18/2018  . Facial paresthesia 01/18/2018  . S/P total knee replacement 09/07/2017  . Obesity (BMI 30-39.9) 05/21/2017  . Adenomatous polyp of sigmoid colon 03/18/2017  . Family history of colon cancer in father 02/23/2017  . Iron deficiency anemia 01/12/2017  . Neck pain 01/11/2017  . Osteoporosis 01/11/2017  . Lentigines 01/11/2017  . Primary osteoarthritis, right shoulder 12/11/2016   Past Medical History:  Diagnosis Date  . Adenomatous polyp of sigmoid colon  03/18/2017   Overview:  Added automatically from request for surgery [id number]  . Alcoholism ([place])    01/18/18 - no use in 5 years  . Asymptomatic varicose veins 01/11/2017  . Iron deficiency anemia 01/12/2017   Last Assessment & Plan:  Given script for ferrous sulfate and order for repeat cbc with diff and iron studies to be done in 1 month Will also refer pt back to [facility] for repeat colonoscopy and to dietician for nutritional counseling, per pt request  . Lentigines 01/11/2017  . Melanocytic nevi of left lower limb, including hip 01/11/2017  . Melanocytic nevi of left upper limb, including shoulder 01/11/2017  . Melanocytic nevi of right lower limb, including hip 01/11/2017  . Melanocytic nevi of right upper limb, including shoulder 01/11/2017  . Melanocytic nevi of trunk 01/11/2017  . Obesity (BMI 30-39.9) 05/21/2017  . Osteoporosis   . Plantar fasciitis   . 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 Seizure ([place]) 01/25/2017   LAST SEIZURE- 2004   . Sleep apnea   . Unilateral primary osteoarthritis, left knee 12/11/2016    Family History  Problem Relation Age of Onset  . Mitral valve prolapse Mother   . Osteoporosis Mother   . Memory loss Mother   . Colon cancer Father   . Alcoholism Father     Past Surgical History:  Procedure Laterality Date  . BARIATRIC SURGERY    . ECTOPIC PREGNANCY SURGERY    . TOTAL KNEE ARTHROPLASTY Left 09/07/2017   Procedure: LEFT TOTAL KNEE ARTHROPLASTY;  Surgeon: Paralee Cancel, MD;  Location: [facility];  Service: Orthopedics;  Laterality: Left;  Adductor Block   Social History   Occupational History  . Occupation: Retired  Tobacco Use  . Smoking status: Never Smoker  . Smokeless tobacco: Never Used  Vaping Use  . Vaping Use: Never used  Substance and Sexual Activity  . Alcohol use: No  . Drug use: No  . Sexual activity: Never

## 2020-09-04 ENCOUNTER — Telehealth: Payer: Self-pay | Admitting: Orthopedic Surgery

## 2020-09-04 NOTE — Telephone Encounter (Signed)
Patient called asked for a call back concerning how long will she be out after surgery. Patient said she keeps her grand children and want to plan accordingly. The number to contact patient is [phone]

## 2020-09-05 DIAGNOSIS — M25531 Pain in right wrist: Secondary | ICD-10-CM | POA: Insufficient documentation

## 2020-09-05 DIAGNOSIS — M25532 Pain in left wrist: Secondary | ICD-10-CM | POA: Insufficient documentation

## 2020-09-06 NOTE — Telephone Encounter (Signed)
I called pt and advised its about a 4-6 week recovery. She would like to proceed with setting up surgery. Do you have a blue sheet for her?

## 2020-09-11 ENCOUNTER — Encounter: Payer: Self-pay | Admitting: Orthopedic Surgery

## 2020-09-16 ENCOUNTER — Other Ambulatory Visit: Payer: Medicare Other

## 2020-09-16 DIAGNOSIS — Z20822 Contact with and (suspected) exposure to covid-19: Secondary | ICD-10-CM

## 2020-09-17 ENCOUNTER — Encounter: Payer: Self-pay | Admitting: Family Medicine

## 2020-09-17 ENCOUNTER — Telehealth (INDEPENDENT_AMBULATORY_CARE_PROVIDER_SITE_OTHER): Payer: Medicare Other | Admitting: Family Medicine

## 2020-09-17 ENCOUNTER — Telehealth: Payer: Medicare Other | Admitting: Family Medicine

## 2020-09-17 DIAGNOSIS — R059 Cough, unspecified: Secondary | ICD-10-CM | POA: Diagnosis not present

## 2020-09-17 DIAGNOSIS — R0981 Nasal congestion: Secondary | ICD-10-CM | POA: Diagnosis not present

## 2020-09-17 LAB — NOVEL CORONAVIRUS, NAA: SARS-CoV-2, NAA: NOT DETECTED

## 2020-09-17 LAB — SARS-COV-2, NAA 2 DAY TAT

## 2020-09-17 MED ORDER — BENZONATATE 100 MG PO CAPS: 100.0000 mg | ORAL_CAPSULE | Freq: Three times a day (TID) | ORAL | 0 refills | Status: DC | PRN

## 2020-09-17 MED ORDER — AMOXICILLIN-POT CLAVULANATE 875-125 MG PO TABS: 1.0000 | ORAL_TABLET | Freq: Two times a day (BID) | ORAL | 0 refills | Status: DC

## 2020-09-17 NOTE — Telephone Encounter (Signed)
Please fill out blue sheet for pt.

## 2020-09-17 NOTE — Telephone Encounter (Signed)
Pt has been rescheduled to this afternoon.

## 2020-09-17 NOTE — Patient Instructions (Signed)
-  I sent the medication(s) we discussed to your pharmacy: Meds ordered this encounter  Medications   benzonatate (TESSALON PERLES) 100 MG capsule    Sig: Take 1 capsule (100 mg total) by mouth 3 (three) times daily as needed.    Dispense:  20 capsule    Refill:  0   amoxicillin-clavulanate (AUGMENTIN) 875-125 MG tablet    Sig: Take 1 tablet by mouth 2 (two) times daily.    Dispense:  20 tablet    Refill:  0     I hope you are feeling better soon!  Seek in person care promptly if your symptoms worsen, new concerns arise or you are not improving with treatment.  It was nice to meet you today. I help [place] out with telemedicine visits on Tuesdays and Thursdays and am available for visits on those days. If you have any concerns or questions following this visit please schedule a follow up visit with your Primary Care doctor or seek care at a local urgent care clinic to avoid delays in care.   

## 2020-09-17 NOTE — Progress Notes (Signed)
Virtual Visit via Video Note  I connected with Summer [name]  on 09/17/20 at  4:20 PM EST by a video enabled telemedicine application and verified that I am speaking with the correct person using two identifiers.  Location patient: home, [place] Location provider:work or home office Persons participating in the virtual visit: patient, provider  I discussed the limitations of evaluation and management by telemedicine and the availability of in person appointments. The patient expressed understanding and agreed to proceed.   HPI:  Acute telemedicine visit for cough: -Onset: about 2 - 2.5 weeks ago -Symptoms include: cough, energy is a little lower, scratchy throat, mucus a little yellow, nasal congestion, ear pressure, chills occ - seems to not be getting better -Denies: fevers, body aches, SOB, CP, vomiting, diarrhea, loss of taste  -grand kids all had colds when she got this -Has tried: vics, tea and honey -Pertinent past medical history: denies hx of seasonal allergies -Pertinent medication allergies:nkda -COVID-19 vaccine status: fully vaccinated  ROS: See pertinent positives and negatives per HPI.  Past Medical History:  Diagnosis Date  . Adenomatous polyp of sigmoid colon 03/18/2017   Overview:  Added automatically from request for surgery [id number]  . Alcoholism ([place])    01/18/18 - no use in 5 years  . Asymptomatic varicose veins 01/11/2017  . Iron deficiency anemia 01/12/2017   Last Assessment & Plan:  Given script for ferrous sulfate and order for repeat cbc with diff and iron studies to be done in 1 month Will also refer pt back to [facility] for repeat colonoscopy and to dietician for nutritional counseling, per pt request  . Lentigines 01/11/2017  . Melanocytic nevi of left lower limb, including hip 01/11/2017  . Melanocytic nevi of left upper limb, including shoulder 01/11/2017  . Melanocytic nevi of right lower limb, including hip 01/11/2017  . Melanocytic nevi of right upper limb, including  shoulder 01/11/2017  . Melanocytic nevi of trunk 01/11/2017  . Obesity (BMI 30-39.9) 05/21/2017  . Osteoporosis   . Plantar fasciitis   . 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 Seizure ([place]) 01/25/2017   LAST SEIZURE- 2004   . Sleep apnea   . Unilateral primary osteoarthritis, left knee 12/11/2016    Past Surgical History:  Procedure Laterality Date  . BARIATRIC SURGERY    . ECTOPIC PREGNANCY SURGERY    . TOTAL KNEE ARTHROPLASTY Left 09/07/2017   Procedure: LEFT TOTAL KNEE ARTHROPLASTY;  Surgeon: Paralee Cancel, MD;  Location: [facility];  Service: Orthopedics;  Laterality: Left;  Adductor Block     Current Outpatient Medications:  .  Acetaminophen (TYLENOL PO), Take by mouth as needed., Disp: , Rfl:  .  amoxicillin-clavulanate (AUGMENTIN) 875-125 MG tablet, Take 1 tablet by mouth 2 (two) times daily., Disp: 20 tablet, Rfl: 0 .  benzonatate (TESSALON PERLES) 100 MG capsule, Take 1 capsule (100 mg total) by mouth 3 (three) times daily as needed., Disp: 20 capsule, Rfl: 0 .  Calcium-Vitamin D-Vitamin K [phone] MG-UNT-MCG TABS, Take 1 tablet by mouth 2 (two) times daily. Also includes Vitamin C, Disp: , Rfl:  .  Cholecalciferol (SM VITAMIN D3) 4000 units CAPS, Take 4,000 Units by mouth daily., Disp: , Rfl:  .  denosumab (PROLIA) 60 MG/ML SOLN injection, Inject 60 mg into the skin every 6 (six) months. Administer in upper arm, thigh, or abdomen, Disp: , Rfl:  .  EPINEPHrine 0.3 mg/0.3 mL IJ SOAJ injection, Inject 0.3 mLs (0.3 mg total) into the muscle as needed for anaphylaxis., Disp: 2  each, Rfl: 1 .  ferrous sulfate (FERROUSUL) 325 (65 FE) MG tablet, Take 1 tablet (325 mg total) 3 (three) times daily with meals by mouth. (Patient taking differently: Take 325 mg by mouth daily with breakfast. ), Disp: , Rfl: 3 .  lamoTRIgine (LAMICTAL) 150 MG tablet, Take 1 tablet (150 mg total) by mouth 2 (two) times daily., Disp: 180 tablet, Rfl: 3 .  levETIRAcetam (KEPPRA) 500 MG tablet, TAKE ONE  TABLET BY MOUTH EVERY MORNING AND TAKE TWO TABLETS BY MOUTH EVERY NIGHT AT BEDTIME, Disp: 270 tablet, Rfl: 3 .  Omega-3 Fatty Acids (OMEGA 3 PO), Take 1 capsule by mouth daily., Disp: , Rfl:  .  triamcinolone cream (KENALOG) 0.5 %, Apply 1 application topically 2 (two) times daily., Disp: 30 g, Rfl: 0 .  vitamin E 600 UNIT capsule, Take 600 Units by mouth daily., Disp: , Rfl:   EXAM:  VITALS per patient if applicable:  GENERAL: alert, oriented, appears well and in no acute distress  HEENT: atraumatic, conjunttiva clear, no obvious abnormalities on inspection of external nose and ears  NECK: normal movements of the head and neck  LUNGS: on inspection no signs of respiratory distress, breathing rate appears normal, no obvious gross SOB, gasping or wheezing  CV: no obvious cyanosis  MS: moves all visible extremities without noticeable abnormality  PSYCH/NEURO: pleasant and cooperative, no obvious depression or anxiety, speech and thought processing grossly intact  ASSESSMENT AND PLAN:  Discussed the following assessment and plan:  Cough  Nasal sinus congestion  -we discussed possible serious and likely etiologies, options for evaluation and workup, limitations of telemedicine visit vs in person visit, treatment, treatment risks and precautions. Pt prefers to treat via telemedicine empirically rather than in person at this moment.  Given duration of symptoms, thick discolored mucus and not improving, she opted for empiric treatment with Augmentin 875 twice daily for 7 to 10 days for possible sinusitis.  Discussed other potential etiologies.  Also sent Tessalon for cough. Scheduled follow up with PCP offered: Agrees to follow-up if needed Advised to seek prompt in person care if worsening, new symptoms arise, or if is not improving with treatment. Discussed options for inperson care if PCP office not available. Did let this patient know that I only do telemedicine on Tuesdays and  Thursdays for [facility]. Advised to schedule follow up visit with PCP or [facility] if any further questions or concerns to avoid delays in care.   I discussed the assessment and treatment plan with the patient. The patient was provided an opportunity to ask questions and all were answered. The patient agreed with the plan and demonstrated an understanding of the instructions.     Lucretia Kern, DO

## 2020-09-17 NOTE — Telephone Encounter (Signed)
I do not have a surgery sheet for [name].

## 2020-09-18 ENCOUNTER — Telehealth: Payer: Self-pay | Admitting: Gastroenterology

## 2020-09-18 NOTE — Telephone Encounter (Signed)
Hi Dr. Tarri Glenn, we have received a referral for you from pt's PCP for a repeat colon.  Pt had colon in 2018.  Records were printed from care everywhere in epic and they will be sent to you for review.  Please advise on scheduling.  Thank you.

## 2020-09-20 ENCOUNTER — Encounter: Payer: Self-pay | Admitting: Family Medicine

## 2020-09-20 ENCOUNTER — Other Ambulatory Visit: Payer: Self-pay | Admitting: Family Medicine

## 2020-09-21 ENCOUNTER — Encounter: Payer: Self-pay | Admitting: Family Medicine

## 2020-09-22 ENCOUNTER — Encounter: Payer: Self-pay | Admitting: Family Medicine

## 2020-09-22 NOTE — Telephone Encounter (Signed)
Await records for review.

## 2020-09-23 ENCOUNTER — Encounter: Payer: Self-pay | Admitting: Family Medicine

## 2020-09-23 ENCOUNTER — Encounter: Payer: Self-pay | Admitting: Orthopedic Surgery

## 2020-09-23 ENCOUNTER — Ambulatory Visit: Payer: Medicare Other | Admitting: Orthopedic Surgery

## 2020-09-23 DIAGNOSIS — M79671 Pain in right foot: Secondary | ICD-10-CM | POA: Diagnosis not present

## 2020-09-23 NOTE — Telephone Encounter (Signed)
OK to schedule colonoscopy at this procedure as open access.  Colonoscopy 2018 x 2 with Dr. Reyne Dumas at the [facility]. The first colonoscopy identified 2 large sigmoid polyps. The doctor was uncomfortable removing them, so scheduled the procedure at the hospital the following day. 2 large pedunculated tubular adenomas (1.5 cm and 2 cm) were located 50 cm from the anal verge.

## 2020-09-23 NOTE — Telephone Encounter (Signed)
I called patient discussed that the surgical intervention would be a Weil osteotomy for the second and third metatarsals as well as a fusion of the base of the second metatarsal we reviewed postoperative nonweightbearing for 3 weeks and in 3 weeks in a postoperative shoe reviewed the risks and benefits and recommended obtaining a [place] sneaker first to see if this will unload pressure significantly and actually relieve her symptoms if this does not work she will call us to proceed with outpatient surgery.

## 2020-09-24 NOTE — Telephone Encounter (Signed)
Okay to send [employer]?

## 2020-09-24 NOTE — Telephone Encounter (Signed)
Dr. Jerline Pain, Patient was last seen virtually with Dr. Maudie Mercury on 11/23.  Patient states she has seen improvement.  States she has less mucus than she was experiencing prior.  States the cough is still continuing and she is experiencing body pain due to the coughing.    Dr. Rogers Blocker is out of the office today.    Patient is requesting a refill on the tessalon Perles.    Could you advise in regard?

## 2020-09-25 MED ORDER — BENZONATATE 100 MG PO CAPS
100.0000 mg | ORAL_CAPSULE | Freq: Three times a day (TID) | ORAL | 0 refills | Status: DC | PRN
Start: 2020-09-25 — End: 2020-11-25

## 2020-09-25 NOTE — Telephone Encounter (Signed)
Called patient and left voice mail to call back to schedule colonoscopy. 

## 2020-10-07 ENCOUNTER — Encounter: Payer: Self-pay | Admitting: Orthopedic Surgery

## 2020-10-14 ENCOUNTER — Other Ambulatory Visit: Payer: Medicare Other

## 2020-10-14 DIAGNOSIS — Z20822 Contact with and (suspected) exposure to covid-19: Secondary | ICD-10-CM

## 2020-10-15 ENCOUNTER — Encounter: Payer: Self-pay | Admitting: Orthopedic Surgery

## 2020-10-15 NOTE — Progress Notes (Signed)
Office Visit Note   Patient: Summer [name]           Date of Birth: 1951/02/18           MRN: [id number] Visit Date: 09/23/2020              Requested by: Orma Flaming, [address] [place],  [place] [address] PCP: Orma Flaming, MD  No chief complaint on file.     HPI: Patient presents in follow-up with painful clawing of the second and third toes right foot with pain beneath the metatarsal heads. Patient wishes to consider and review surgical options.  Assessment & Plan: Visit Diagnoses:  1. Pain in right foot     Plan: Reviewed surgical options which would include a Weil osteotomy for the second and third metatarsals and a fusion of the base of the second metatarsal due to degenerative changes across the Lisfranc complex.  Follow-Up Instructions: No follow-ups on file.   Ortho Exam  Patient is alert, oriented, no adenopathy, well-dressed, normal affect, normal respiratory effort. Examination patient has good pulses she has a bony spur and pain to palpation of the second metatarsal. Radiographs show degenerative change in the base of the second metatarsal across the Lisfranc complex. She has a long second and third metatarsal that are tender to palpation beneath the metatarsal heads she has clawing of the lesser toes.  Imaging: No results found. No images are attached to the encounter.  Labs: Lab Results  Component Value Date   REPTSTATUS 06/30/2017 FINAL 06/28/2017   CULT NO GROUP A STREP (S.PYOGENES) ISOLATED 06/28/2017     Lab Results  Component Value Date   ALBUMIN 4.4 08/18/2019    No results found for: MG Lab Results  Component Value Date   VD25OH 29.86 (L) 08/18/2019    No results found for: PREALBUMIN CBC EXTENDED Latest Ref Rng & Units 08/18/2019 04/13/2018 11/04/2017  WBC 4.0 - 10.5 K/uL 5.1 4.6 5.2  RBC 3.87 - 5.11 Mil/uL 4.60 4.83 4.61  HGB 12.0 - 15.0 g/dL 14.2 14.8 13.4  HCT 36.0 - 46.0 % 43.1 43.5 41.5  PLT 150.0 - 400.0  K/uL 230.0 247.0 253  NEUTROABS 1.4 - 7.7 K/uL 2.3 2.2 -  LYMPHSABS 0.7 - 4.0 K/uL 2.1 1.9 -     There is no height or weight on file to calculate BMI.  Orders:  No orders of the defined types were placed in this encounter.  No orders of the defined types were placed in this encounter.    Procedures: No procedures performed  Clinical Data: No additional findings.  ROS:  All other systems negative, except as noted in the HPI. Review of Systems  Objective: Vital Signs: There were no vitals taken for this visit.  Specialty Comments:  No specialty comments available.  PMFS History: Patient Active Problem List   Diagnosis Date Noted  . Mixed hyperlipidemia 07/07/2019  . Vitamin D deficiency 07/07/2019  . Partial symptomatic epilepsy with complex partial seizures, not intractable, without status epilepticus ([place]) 01/18/2018  . Facial paresthesia 01/18/2018  . S/P total knee replacement 09/07/2017  . Obesity (BMI 30-39.9) 05/21/2017  . Adenomatous polyp of sigmoid colon 03/18/2017  . Family history of colon cancer in father 02/23/2017  . Iron deficiency anemia 01/12/2017  . Neck pain 01/11/2017  . Osteoporosis 01/11/2017  . Lentigines 01/11/2017  . Primary osteoarthritis, right shoulder 12/11/2016   Past Medical History:  Diagnosis Date  . Adenomatous polyp of sigmoid colon 03/18/2017  Overview:  Added automatically from request for surgery [phone]  . Alcoholism ([place])    01/18/18 - no use in 5 years  . Asymptomatic varicose veins 01/11/2017  . Iron deficiency anemia 01/12/2017   Last Assessment & Plan:  Given script for ferrous sulfate and order for repeat cbc with diff and iron studies to be done in 1 month Will also refer pt back to [facility] for repeat colonoscopy and to dietician for nutritional counseling, per pt request  . Lentigines 01/11/2017  . Melanocytic nevi of left lower limb, including hip 01/11/2017  . Melanocytic nevi of left upper limb, including shoulder  01/11/2017  . Melanocytic nevi of right lower limb, including hip 01/11/2017  . Melanocytic nevi of right upper limb, including shoulder 01/11/2017  . Melanocytic nevi of trunk 01/11/2017  . Obesity (BMI 30-39.9) 05/21/2017  . Osteoporosis   . Plantar fasciitis   . 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 Seizure ([place]) 01/25/2017   LAST SEIZURE- 2004   . Sleep apnea   . Unilateral primary osteoarthritis, left knee 12/11/2016    Family History  Problem Relation Age of Onset  . Mitral valve prolapse Mother   . Osteoporosis Mother   . Memory loss Mother   . Colon cancer Father   . Alcoholism Father     Past Surgical History:  Procedure Laterality Date  . BARIATRIC SURGERY    . ECTOPIC PREGNANCY SURGERY    . TOTAL KNEE ARTHROPLASTY Left 09/07/2017   Procedure: LEFT TOTAL KNEE ARTHROPLASTY;  Surgeon: Paralee Cancel, MD;  Location: [facility];  Service: Orthopedics;  Laterality: Left;  Adductor Block   Social History   Occupational History  . Occupation: Retired  Tobacco Use  . Smoking status: Never Smoker  . Smokeless tobacco: Never Used  Vaping Use  . Vaping Use: Never used  Substance and Sexual Activity  . Alcohol use: No  . Drug use: No  . Sexual activity: Never

## 2020-10-16 LAB — SARS-COV-2, NAA 2 DAY TAT

## 2020-10-16 LAB — NOVEL CORONAVIRUS, NAA: SARS-CoV-2, NAA: NOT DETECTED

## 2020-10-23 ENCOUNTER — Telehealth: Payer: Self-pay | Admitting: Family Medicine

## 2020-10-23 NOTE — Telephone Encounter (Signed)
Left message for patient to call back and schedule Medicare Annual Wellness Visit (AWV) either virtually OR in office.  ° °Last AWV 09/14/19; please schedule at anytime with [facility]-Nurse Health Advisor at [facility]. ° °This should be a 45 minute visit. ° ° °

## 2020-11-14 ENCOUNTER — Encounter: Payer: Self-pay | Admitting: Neurology

## 2020-11-14 ENCOUNTER — Telehealth (INDEPENDENT_AMBULATORY_CARE_PROVIDER_SITE_OTHER): Payer: Medicare Other | Admitting: Neurology

## 2020-11-14 DIAGNOSIS — G40209 Localization-related (focal) (partial) symptomatic epilepsy and epileptic syndromes with complex partial seizures, not intractable, without status epilepticus: Secondary | ICD-10-CM

## 2020-11-14 MED ORDER — LEVETIRACETAM 500 MG PO TABS: ORAL_TABLET | ORAL | 3 refills | Status: DC

## 2020-11-14 NOTE — Progress Notes (Signed)
Virtual Visit via Video Note  I connected with Summer [name] on 11/14/20 at 11:15 AM EST by a video enabled telemedicine application and verified that I am speaking with the correct person using two identifiers.  Location: Patient: at her home Provider: in the office   I discussed the limitations of evaluation and management by telemedicine and the availability of in person appointments. The patient expressed understanding and agreed to proceed.  History of Present Illness: Summer [name] a 70 year old female, seen in refer by her primary care doctor Briscoe Deutscher, for evaluation of seizure, facial paresthesia, initial evaluation was on January 18, 2018.  She had a history of osteoporosis, is taking Prolia 60 mg every 6 months  History of complex partial seizure with secondary generalization  I reviewed previous neurology evaluation from [facility] by Dr.Jehi  First seizure was in the labor room at age 40 after delivery, no warning signs,  Around 2015, she started binge drink for 3-4 years, she had recurrent seizure in February 2015, patient was amnestic of the event, husband got a call from her primary care physician the patient had a seizure during her routine appointment, with tongue biting, was admitted to [facility],   CT, MRI of the brain with and without contrast showed mild supratentorium small vessel disease, EEG showed no acute abnormality, no antiepileptic medication was given, record mentions that was thought to be due to alcohol withdrawal, patient stated that she was drinking heavily, hard liquor on a daily basis, but did stop drinking for weekspriorto the event in February 2015.  Second event July 2015, was at a work picnic, then woke up on the hospital, was witnessed by a physician there that she had a seizure, loss of consciousness with tonic-clonic activity of the extremities that lasted 5 minutes, followed by 30-60 minutes post  ictal confusion, Keppra was started at 750 twice a day, later was increased to 1000 mg twice a daylater, patient complains of feeling sleepy,  Third event was in [place], she was witnessed by her friend that she has transient episode of confusion,  Her medication was later changed to lamotrigine 150 mg twice a day, Keppra 500 mg twice daily, stayed on that regimen since, she is tolerating the medication well, there was no recurrent seizures.  November 13, 2014 [facility] multihour EEG showed intermittent bilateral independent temporal slowing., support a diagnosis of bilateral temporal cortical dysfunction, no epileptiform discharge or seizure activity noted.  Today she also complains of more than 3 years history of constant right facial paresthesia, burning hypersensitivity, mild asymmetry of the face, she noticed more wrinkles in her left lower face while woke up in the morning time.  Laboratory evaluation in January 2019 showed mild elevated LDL 101, negative troponin, INR 1.01, d-dimer was mildly elevated at 0.95, negative troponin, normal BMP, CBC  UPDATE Sept 5 2019: She is doing very well, had no recurrent seizure, has quit drinking. Now taking keppra 500mg  bid and lamotrigine 150mg  bid.   We personally reviewed MRI brain w/wo in May 2019, there are chronic small vessel disease, no acute abnormalities.  EEG was normal in June 2019  Update November 13, 2019 SS: Since last seen, she remained on both Lamictal and Keppra, she was nervous to come off the Lamictal quickly, after speaking with [facility] clinic she decided to remain on dual therapy.  She has continued to do well, has not had recurrent seizure.  She is open to  coming off the Lamictal, if done slowly.  She has continued right-sided facial paresthesia, pressure to right face, some asymmetry on the right x 3 years.  Overall, has been doing well, has her grandson with her today.   Update November 14, 2020 SS: Here today for follow-up via virtual visit, continues to do well, no recurrent seizures (last in 2016ish).  Has decided to remain on Keppra 500 mg twice a day, Lamictal 150 mg twice a day.  Previously when she saw Dr. Krista Blue in 2019, decided to taper off Lamictal, do higher dose Keppra 500/1000.  She felt more comfortable with dual treatment, worried she would have a seizure, lose her driver's license in [place]. Moved from [place]. She takes care of her 2 grandkids during the day.  Is on vitamin D supplement.  Doing well, sees Dr. Rogers Blocker PCP.  Observations/Objective: Via virtual visit, is alert and oriented, speech is clear and concise Excellent historian, follows commands, facial symmetry noted  Assessment and Plan: 1.  Complex partial seizure with secondary generalization -Last seizure was in 2016 -When saw Dr. Krista Blue in September 2019 decided to taper off Lamictal, remain on Keppra at higher dose -She decided to remain on Lamictal 150 mg twice a day, Keppra 500 mg twice a day, has done well with this, more comfortable with dual therapy, okay to continue this -Call for seizure activity, has lab work done through PCP, is due for physical  Follow Up Instructions: 1 year 11/18/2021   I discussed the assessment and treatment plan with the patient. The patient was provided an opportunity to ask questions and all were answered. The patient agreed with the plan and demonstrated an understanding of the instructions.   The patient was advised to call back or seek an in-person evaluation if the symptoms worsen or if the condition fails to improve as anticipated.  I spent 20 minutes of face-to-face and non-face-to-face time with patient.  This included previsit chart review, lab review, study review, order entry, electronic health record documentation, patient education.  Evangeline Dakin, DNP  [facility] [address] [place],  [address] [phone]

## 2020-11-18 ENCOUNTER — Encounter: Payer: Self-pay | Admitting: Family Medicine

## 2020-11-18 IMAGING — MG DIGITAL SCREENING BILATERAL MAMMOGRAM WITH TOMO AND CAD
8 series · 8 of 24 positions shown · non-contrast
Comparison: Previous exam(s).

CLINICAL DATA: Screening.

EXAM:
DIGITAL SCREENING BILATERAL MAMMOGRAM WITH TOMO AND CAD

[R MLO synth-2D]
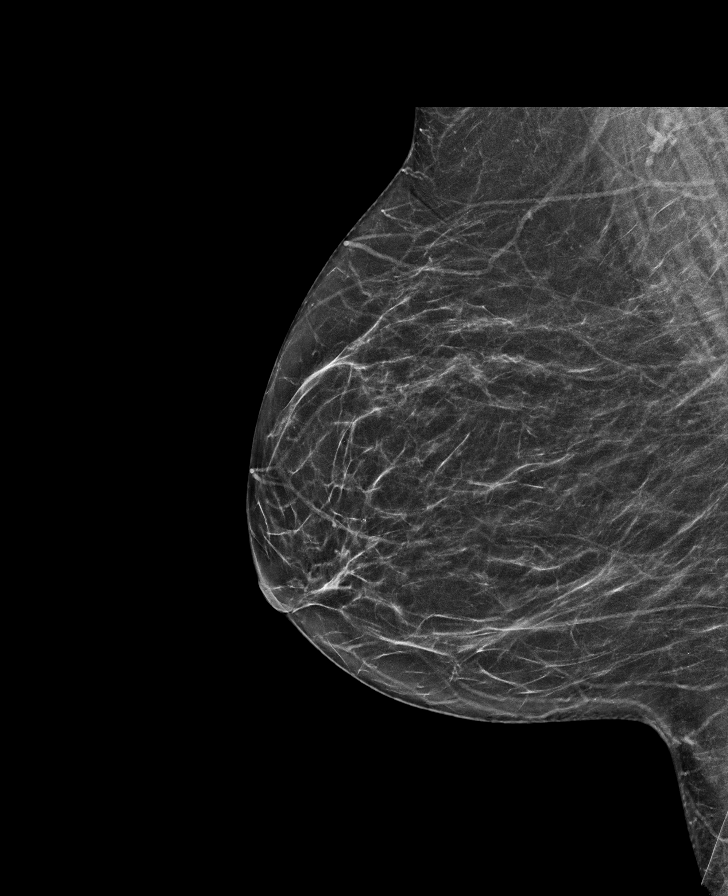

[L CC synth-2D]
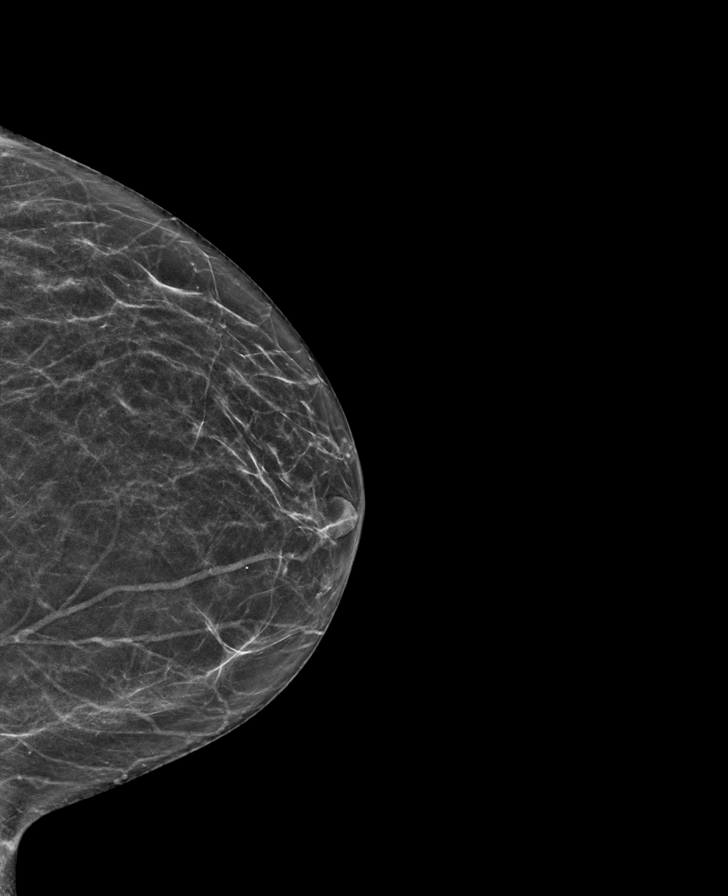

[R CC synth-2D]
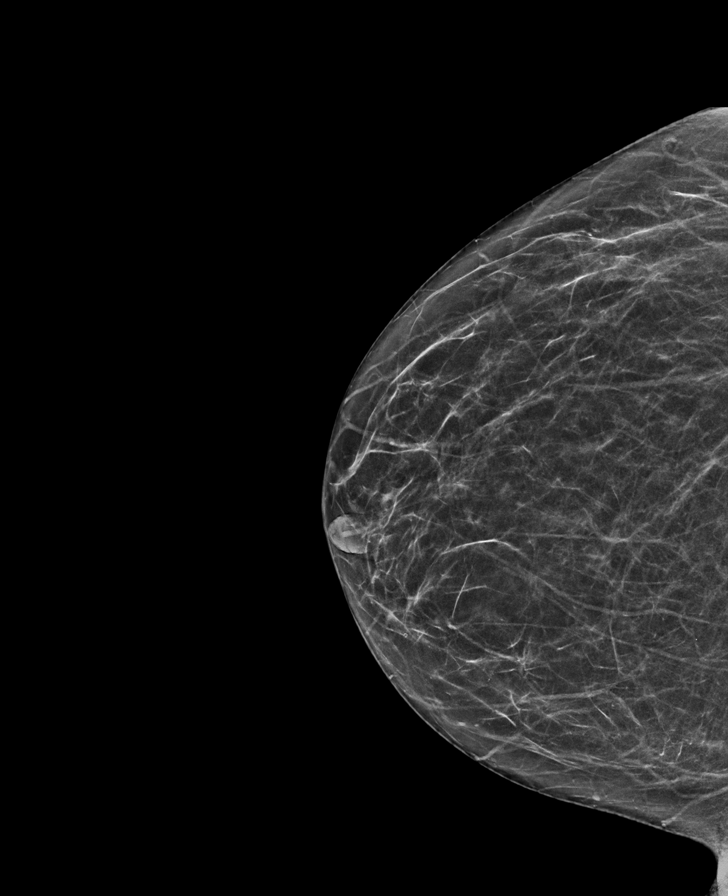

[L MLO synth-2D]
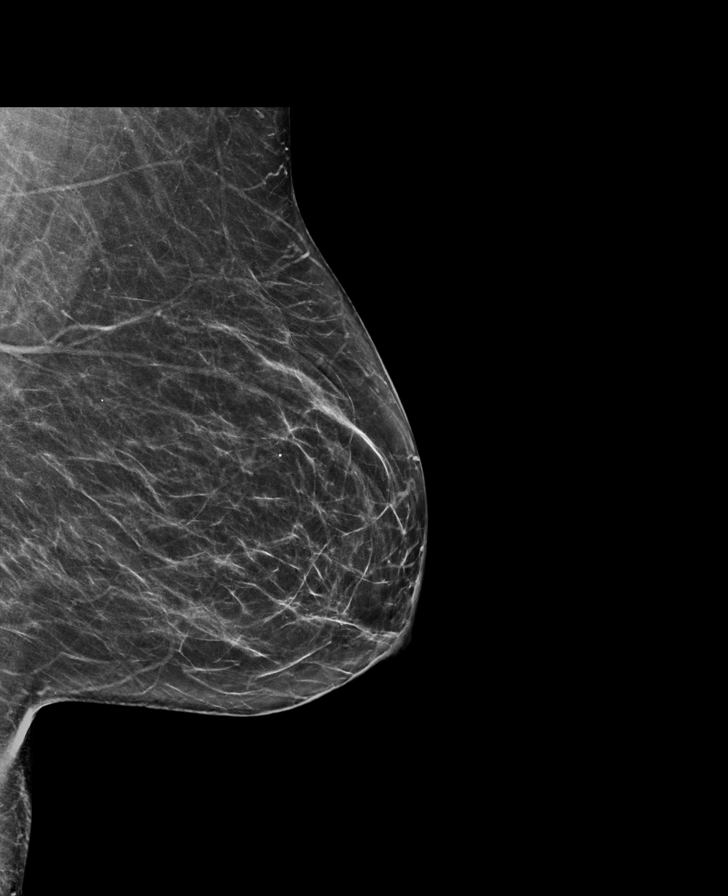

[R CC tomo · tomo slice 31/61.0]
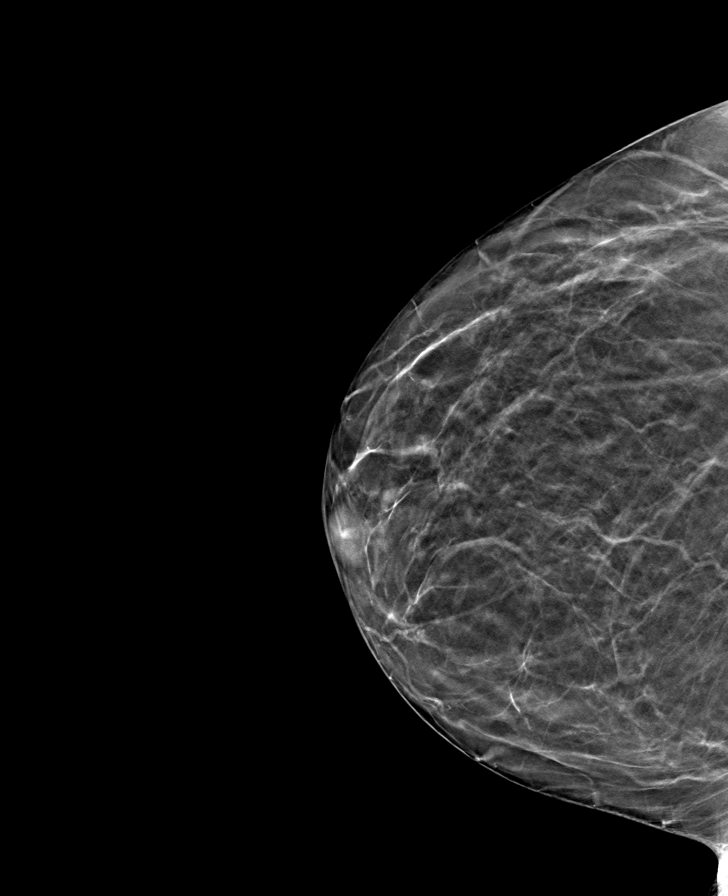

[L MLO tomo · tomo slice 35/69.0]
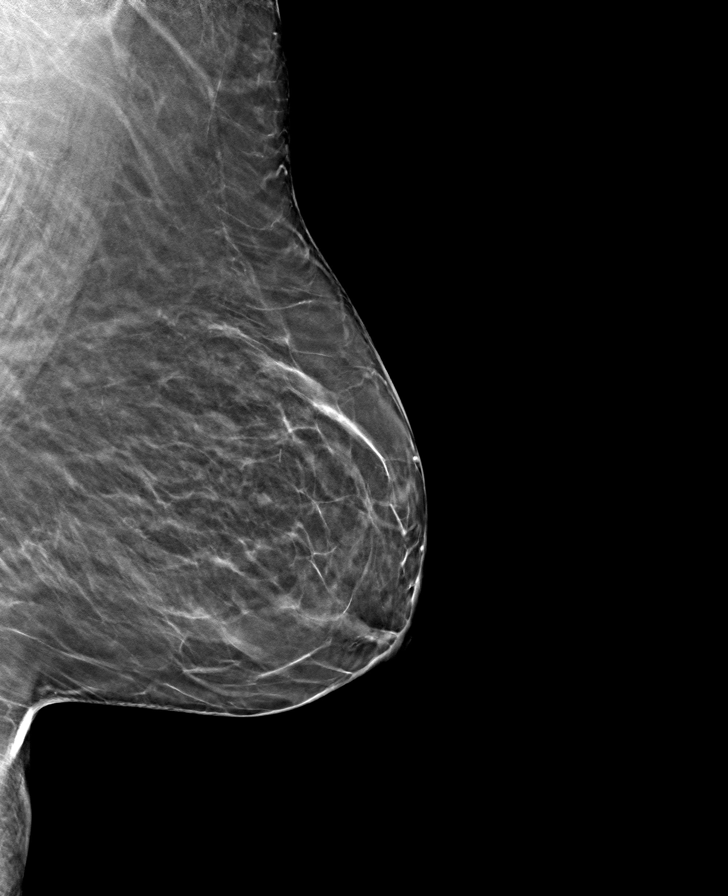

[L CC tomo · tomo slice 29/57.0]
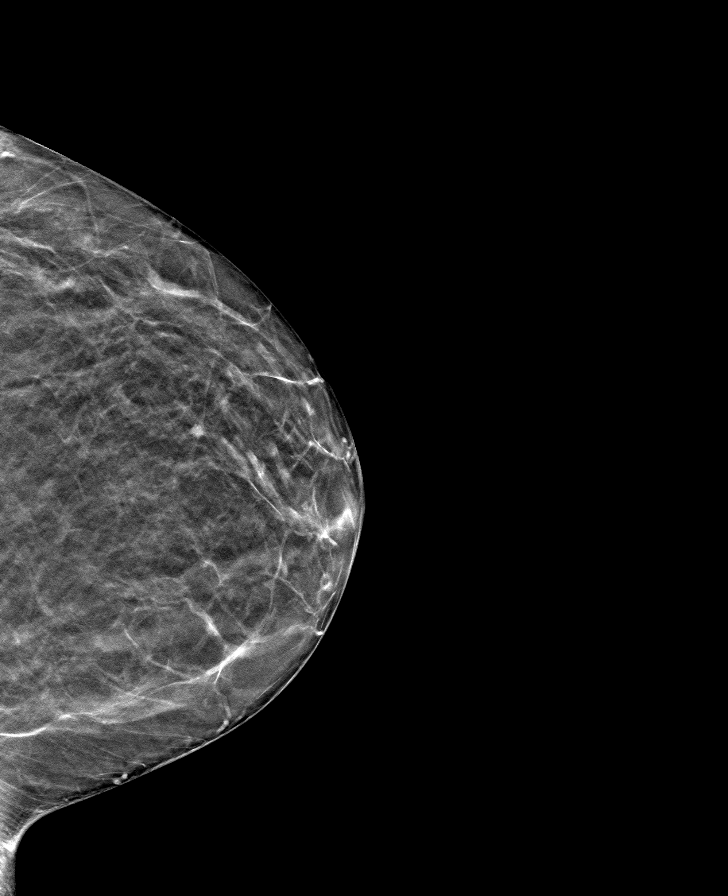

[R MLO tomo · tomo slice 33/64.0]
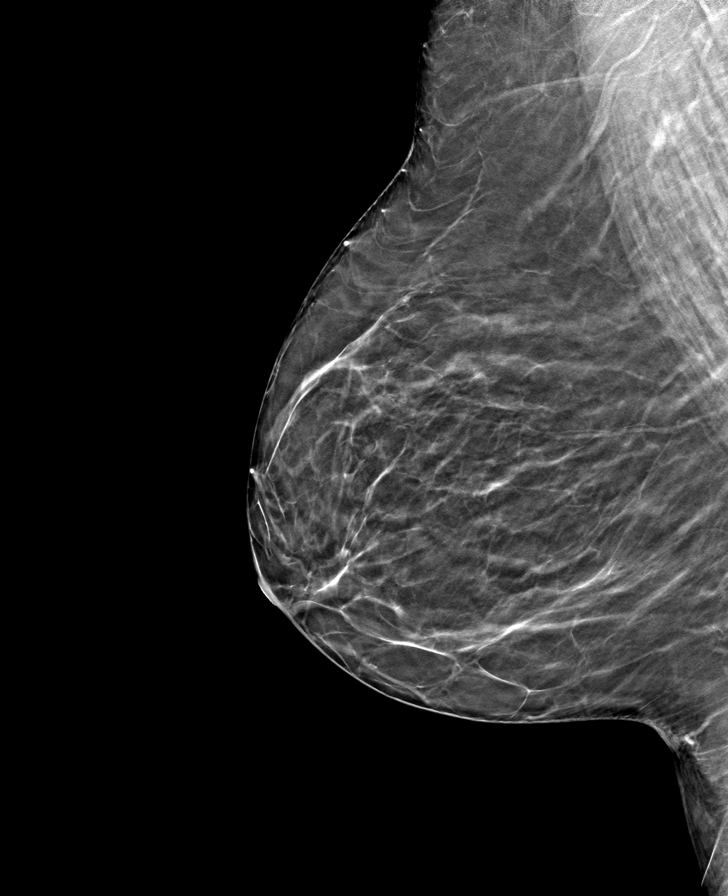

[8 of 24 positions shown; findings below may reference images not displayed]

ACR Breast Density Category b: There are scattered areas of
fibroglandular density.
FINDINGS: There are no findings suspicious for malignancy. Images were
processed with CAD.
IMPRESSION: No mammographic evidence of malignancy. A result letter of this
screening mammogram will be mailed directly to the patient.

RECOMMENDATION:
Screening mammogram in one year. (Code:[id number])

BI-RADS CATEGORY  1: Negative.

## 2020-11-23 ENCOUNTER — Other Ambulatory Visit: Payer: Self-pay | Admitting: Neurology

## 2020-11-25 ENCOUNTER — Ambulatory Visit (AMBULATORY_SURGERY_CENTER): Payer: Medicare Other | Admitting: *Deleted

## 2020-11-25 ENCOUNTER — Other Ambulatory Visit: Payer: Self-pay

## 2020-11-25 VITALS — Ht 66.0 in | Wt 220.0 lb

## 2020-11-25 DIAGNOSIS — Z86010 Personal history of colonic polyps: Secondary | ICD-10-CM

## 2020-11-25 DIAGNOSIS — Z800 Family history of malignant neoplasm of digestive organs: Secondary | ICD-10-CM

## 2020-11-25 MED ORDER — SUTAB 1479-225-188 MG PO TABS: 1.0000 | ORAL_TABLET | ORAL | 0 refills | Status: DC

## 2020-11-25 NOTE — Progress Notes (Signed)
Patient is here in-person for PV. Patient denies any allergies to eggs or soy. Patient denies any problems with anesthesia/sedation. Patient denies any oxygen use at home. Patient denies taking any diet/weight loss medications or blood thinners. Patient is not being treated for MRSA or C-diff. Patient is aware of our care-partner policy and BLTJQ-30 safety protocol. EMMI education assigned to the patient for the procedure, sent to [place].   COVID-19 vaccines completed on 07/31/2020 x3, per patient.   Prep Prescription coupon given to the patient.

## 2020-11-28 ENCOUNTER — Ambulatory Visit (INDEPENDENT_AMBULATORY_CARE_PROVIDER_SITE_OTHER): Payer: Medicare Other | Admitting: Family Medicine

## 2020-11-28 ENCOUNTER — Encounter: Payer: Self-pay | Admitting: Family Medicine

## 2020-11-28 ENCOUNTER — Other Ambulatory Visit (HOSPITAL_COMMUNITY)
Admission: RE | Admit: 2020-11-28 | Discharge: 2020-11-28 | Disposition: A | Discharge status: Home / Self Care | Payer: Medicare Other | Source: Ambulatory Visit | Attending: Family Medicine | Admitting: Family Medicine

## 2020-11-28 ENCOUNTER — Other Ambulatory Visit: Payer: Self-pay

## 2020-11-28 VITALS — BP 138/66 | HR 64 | Temp 97.5°F | Ht 66.0 in | Wt 223.2 lb

## 2020-11-28 DIAGNOSIS — R8290 Unspecified abnormal findings in urine: Secondary | ICD-10-CM | POA: Diagnosis not present

## 2020-11-28 DIAGNOSIS — E782 Mixed hyperlipidemia: Secondary | ICD-10-CM

## 2020-11-28 DIAGNOSIS — Z124 Encounter for screening for malignant neoplasm of cervix: Secondary | ICD-10-CM

## 2020-11-28 DIAGNOSIS — Z1151 Encounter for screening for human papillomavirus (HPV): Secondary | ICD-10-CM | POA: Diagnosis not present

## 2020-11-28 DIAGNOSIS — G40209 Localization-related (focal) (partial) symptomatic epilepsy and epileptic syndromes with complex partial seizures, not intractable, without status epilepticus: Secondary | ICD-10-CM

## 2020-11-28 DIAGNOSIS — N368 Other specified disorders of urethra: Secondary | ICD-10-CM

## 2020-11-28 DIAGNOSIS — M810 Age-related osteoporosis without current pathological fracture: Secondary | ICD-10-CM | POA: Diagnosis not present

## 2020-11-28 DIAGNOSIS — E559 Vitamin D deficiency, unspecified: Secondary | ICD-10-CM

## 2020-11-28 LAB — POCT URINALYSIS DIPSTICK
Bilirubin, UA: NEGATIVE
Blood, UA: NEGATIVE
Glucose, UA: NEGATIVE
Ketones, UA: NEGATIVE
Leukocytes, UA: NEGATIVE
Nitrite, UA: NEGATIVE
Protein, UA: NEGATIVE
Spec Grav, UA: 1.01 (ref 1.010–1.025)
Urobilinogen, UA: 0.2 E.U./dL
pH, UA: 6 (ref 5.0–8.0)

## 2020-11-28 NOTE — Patient Instructions (Signed)
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 I have referred you to urogynecology.  Typically we use topical estrogen to help this as well. I can order this or we can wait until you see specialist.   So good to see you! Come back when you are fasting! No food or drink for 8 hours, but water and black coffee are okay!   Dr. Rogers Blocker

## 2020-11-28 NOTE — Progress Notes (Signed)
Patient: Summer [name] MRN: [id number] DOB: 1951/10/03 PCP: Orma Flaming, MD     Subjective:  Chief Complaint  Patient presents with  . Hyperlipidemia  . Osteoporosis  . Vitamin D Deficiency  . cervical cancer screening    HPI: The patient is a 70 y.o. female who presents today for routine follow up. She complains of urine frequency, and foul odor. She has requested a PAP smear. She has not had one since moving to [place]. She states having a Endoscopy scheduled for 12/09/2020.  Hyperlipidemia She is not fasting today. She will come back for fasting lipid panel. She has no family hx of CAD or stroke. She does have a remote history of 2 years of smoking. NO hx of diabetes or HTN.  The 10-year ASCVD risk score Mikey Bussing DC Brooke Bonito., et al., 2013) is: 9.5%   She has not had a pap smear in 10 years or so. She has no hx of abnormal pap smears. Not sexually active and no history of STDs. No vaginal discharge or pain. She does have increased urinary frequency and odor to her urine. No dysuria or abdominal pain. I saw her in June for same complaints and had negative urine, culture and negative BV, yeast.   Vitamin d deficiency -due for lab work.   Osteoporosis -last DEXA in 01/2020. Repeat in one year after prolia x 1 year.  01/2021 due.   She has her colonoscopy scheduled for 12/09/20.   She has had her covid booster.    Review of Systems  Constitutional: Negative for appetite change, chills, fatigue and fever.  HENT: Negative for dental problem, ear pain, hearing loss and trouble swallowing.   Eyes: Negative for visual disturbance.  Respiratory: Negative for cough, chest tightness and shortness of breath.   Cardiovascular: Negative for chest pain, palpitations and leg swelling.  Gastrointestinal: Negative for abdominal pain, blood in stool, diarrhea and nausea.  Endocrine: Negative for cold intolerance, polydipsia, polyphagia and polyuria.  Genitourinary: Positive for frequency. Negative  for dysuria, hematuria and pelvic pain.       Foul smelling urine   Musculoskeletal: Negative for arthralgias.  Skin: Negative for rash.  Neurological: Negative for dizziness and headaches.  Psychiatric/Behavioral: Negative for dysphoric mood and sleep disturbance. The patient is not nervous/anxious.     Allergies Patient is allergic to bee venom.  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), Alcoholism ([place]), Asymptomatic varicose veins (01/11/2017), Cancer ([place]), Iron deficiency anemia (01/12/2017), Lentigines (01/11/2017), Melanocytic nevi of left lower limb, including hip (01/11/2017), Melanocytic nevi of left upper limb, including shoulder (01/11/2017), Melanocytic nevi of right lower limb, including hip (01/11/2017), Melanocytic nevi of right upper limb, including shoulder (01/11/2017), Melanocytic nevi of trunk (01/11/2017), Obesity (BMI 30-39.9) (05/21/2017), Osteoporosis, Plantar fasciitis, Primary osteoarthritis, right shoulder (12/11/2016), Seizure ([place]), Sleep apnea, and Unilateral primary osteoarthritis, 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  Family History Pateint's family history includes Alcoholism in her father; Colon cancer (age of onset: 67) in her father; Memory loss in her mother; Mitral valve prolapse in her mother; Osteoporosis in her mother.  Social History Patient  reports that she has never smoked. She has never used smokeless tobacco. She reports that she does not drink alcohol and does not use drugs.    Objective: Vitals:   11/28/20 1400  BP: 138/66  Pulse: 64  Temp: (!) 97.5 F (36.4 C)  TempSrc: Temporal  SpO2: 100%  Weight: 223 lb 3.2 oz (101.2 kg)  Height: 5\' 6"  (1.676 m)    Body mass index is 36.03  kg/m.  Physical Exam Vitals reviewed. Exam conducted with a chaperone present.  Constitutional:      Appearance: Normal appearance. She is well-developed and well-nourished. She is obese.  HENT:     Head: Normocephalic and atraumatic.     Right Ear: Tympanic membrane, ear canal and external ear normal.     Left Ear: Tympanic membrane, ear canal and external ear normal.     Mouth/Throat:     Mouth: Oropharynx is clear and moist.  Eyes:     Extraocular Movements: Extraocular movements intact and EOM normal.     Conjunctiva/sclera: Conjunctivae normal.     Pupils: Pupils are equal, round, and reactive to light.  Neck:     Thyroid: No thyromegaly.  Cardiovascular:     Rate and Rhythm: Normal rate and regular rhythm.     Pulses: Intact distal pulses.     Heart sounds: Normal heart sounds. No murmur heard.   Pulmonary:     Effort: Pulmonary effort is normal.     Breath sounds: Normal breath sounds.  Chest:  Breasts:     Right: Normal.     Left: Normal.    Abdominal:     General: Bowel sounds are normal. There is no distension.     Palpations: Abdomen is soft.     Tenderness: There is no abdominal tenderness.  Genitourinary:    Comments: Urethral prolapse vs. Caruncle  Musculoskeletal:     Cervical back: Normal range of motion and neck supple.  Lymphadenopathy:     Cervical: No cervical adenopathy.  Skin:    General: Skin is warm and dry.     Capillary Refill: Capillary refill takes less than 2 seconds.     Findings: No rash.  Neurological:     General: No focal deficit present.     Mental Status: She is alert and oriented to person, place, and time.     Cranial Nerves: No cranial nerve deficit.     Coordination: Coordination normal.     Deep Tendon Reflexes: Reflexes normal.  Psychiatric:        Mood and Affect: Mood and affect and mood normal.        Behavior: Behavior normal.        Assessment/plan: 1. Foul smelling urine I think this could be due to her  supplements. Advised she stop x 1 week and see if improved. Urine normal today and has been in past. Culture pending, but has been normal in past. Usually due to something in diet.  - POCT Urinalysis Dipstick - Urine Culture  2. Partial symptomatic epilepsy with complex partial seizures, not intractable, without status epilepticus ([place]) Seizure free for years. Continue with medication.   3. Osteoporosis, unspecified osteoporosis type, unspecified pathological fracture presence -on prolia. Due for repeat dexa in 01/2021  4. Mixed hyperlipidemia  - CBC with Differential/Platelet; Future - Comprehensive metabolic panel; Future - Lipid panel; Future  5. Vitamin D deficiency  - VITAMIN D 25 Hydroxy (Vit-D Deficiency, Fractures); Future  6. Cervical cancer screening Has not had a pap smear in 10 years. I think it's reasonable to test today.  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 Could start topical estrogen cream and I wonder  if contributing to her urinary frequency. Referral to urogyn.  - Ambulatory referral to Urogynecology   This visit occurred during the SARS-CoV-2 public health emergency.  Safety protocols were in place, including screening questions prior to the visit, additional usage of staff PPE, and extensive cleaning of exam room while observing appropriate contact time as indicated for disinfecting solutions.     Return if symptoms worsen or fail to improve.    Orma Flaming, MD [place]   11/28/2020

## 2020-11-29 LAB — URINE CULTURE
MICRO NUMBER:: 11491986
Result:: NO GROWTH
SPECIMEN QUALITY:: ADEQUATE

## 2020-12-02 ENCOUNTER — Other Ambulatory Visit: Payer: Medicare Other

## 2020-12-03 LAB — CYTOLOGY - PAP
Comment: NEGATIVE
Diagnosis: NEGATIVE
High risk HPV: NEGATIVE

## 2020-12-09 ENCOUNTER — Other Ambulatory Visit: Payer: Self-pay

## 2020-12-09 ENCOUNTER — Encounter: Payer: Self-pay | Admitting: Gastroenterology

## 2020-12-09 ENCOUNTER — Ambulatory Visit (AMBULATORY_SURGERY_CENTER): Payer: Medicare Other | Admitting: Gastroenterology

## 2020-12-09 VITALS — BP 124/69 | HR 59 | Temp 96.9°F | Resp 19 | Ht 66.0 in | Wt 220.0 lb

## 2020-12-09 DIAGNOSIS — D126 Benign neoplasm of colon, unspecified: Secondary | ICD-10-CM | POA: Diagnosis not present

## 2020-12-09 DIAGNOSIS — Z800 Family history of malignant neoplasm of digestive organs: Secondary | ICD-10-CM

## 2020-12-09 DIAGNOSIS — Z860100 Personal history of colon polyps, unspecified: Secondary | ICD-10-CM

## 2020-12-09 DIAGNOSIS — D123 Benign neoplasm of transverse colon: Secondary | ICD-10-CM

## 2020-12-09 DIAGNOSIS — D124 Benign neoplasm of descending colon: Secondary | ICD-10-CM

## 2020-12-09 DIAGNOSIS — D120 Benign neoplasm of cecum: Secondary | ICD-10-CM | POA: Diagnosis not present

## 2020-12-09 MED ORDER — SODIUM CHLORIDE 0.9 % IV SOLN: 500.0000 mL | INTRAVENOUS | Status: DC

## 2020-12-09 NOTE — Op Note (Signed)
[place] Patient Name: Summer [name] Procedure Date: 12/09/2020 1:29 PM MRN: [id number] Endoscopist: Thornton Park MD, MD Age: 70 Referring MD:  Date of Birth: Aug 03, 1951 Gender: Female Account #: [id number] Procedure:                Colonoscopy Indications:              High risk colon cancer surveillance: Personal                            history of adenoma (10 mm or greater in size)                           PRIOR COLONOSCOPY 2015 by Dr. Sigurd Sos was negative;                            Colonoscopy 2018 x 2 with Dr. Reyne Dumas at the                            [facility]. The first colonoscopy                            identified 2 large sigmoid polyps. The doctor was                            uncomfortable removing them, so scheduled the                            procedure at the hospital the following day. 2                            large pedunculated tubular adenomas (1.5 cm and 2                            cm) were located 50 cm from the anal verge.                           FAMILY HISTORY POSITIVE FOR father had colon cancer                            age 56 COLON CANCER OR COLON POLYPS Medicines:                Monitored Anesthesia Care Procedure:                Pre-Anesthesia Assessment:                           - Prior to the procedure, a History and Physical                            was performed, and patient medications and                            allergies were reviewed. The patient's tolerance of  previous anesthesia was also reviewed. The risks                            and benefits of the procedure and the sedation                            options and risks were discussed with the patient.                            All questions were answered, and informed consent                            was obtained. Prior Anticoagulants: The patient has                            taken no previous  anticoagulant or antiplatelet                            agents. ASA Grade Assessment: III - A patient with                            severe systemic disease. After reviewing the risks                            and benefits, the patient was deemed in                            satisfactory condition to undergo the procedure.                           After obtaining informed consent, the colonoscope                            was passed under direct vision. Throughout the                            procedure, the patient's blood pressure, pulse, and                            oxygen saturations were monitored continuously. The                            Colonoscope was introduced through the anus and                            advanced to the 3 cm into the ileum. A second                            forward view of the right colon was performed. The                            colonoscopy was performed without difficulty. The  patient tolerated the procedure well. The quality                            of the bowel preparation was good. The terminal                            ileum, ileocecal valve, appendiceal orifice, and                            rectum were photographed. Scope In: 1:32:15 PM Scope Out: 1:52:50 PM Scope Withdrawal Time: 0 hours 15 minutes 16 seconds  Total Procedure Duration: 0 hours 20 minutes 35 seconds  Findings:                 Non-bleeding internal hemorrhoids were found.                           Multiple small-mouthed diverticula were found in                            the sigmoid colon and descending colon. There were                            scattered diverticula in the right colon.                           A 2 mm polyp was found in the descending colon. The                            polyp was sessile. The polyp was removed with a                            cold snare. Resection and retrieval were complete.                             Estimated blood loss was minimal.                           A 1 mm polyp was found in the hepatic flexure. The                            polyp was sessile. The polyp was removed with a                            cold snare. Resection and retrieval were complete.                            Estimated blood loss was minimal.                           A 2 mm polyp was found in the ascending colon. The                            polyp was  sessile. The polyp was removed with a                            cold snare. Resection and retrieval were complete.                            Estimated blood loss was minimal.                           Tattoo present 40 cm from the anal verge. No                            residual polyp present. The exam was otherwise                            without abnormality on direct and retroflexion                            views. Complications:            No immediate complications. Estimated blood loss:                            Minimal. Estimated Blood Loss:     Estimated blood loss was minimal. Impression:               - Non-bleeding internal hemorrhoids.                           - Diverticulosis in the sigmoid colon and in the                            descending colon.                           - One 2 mm polyp in the descending colon, removed                            with a cold snare. Resected and retrieved.                           - One 1 mm polyp at the hepatic flexure, removed                            with a cold snare. Resected and retrieved.                           - One 2 mm polyp in the ascending colon, removed                            with a cold snare. Resected and retrieved.                           - The examination was otherwise normal on direct  and retroflexion views. Recommendation:           - Patient has a contact number available for                            emergencies. The signs and  symptoms of potential                            delayed complications were discussed with the                            patient. Return to normal activities tomorrow.                            Written discharge instructions were provided to the                            patient.                           - Follow a high fiber diet. Drink at least 64                            ounces of water daily. Add a daily stool bulking                            agent such as psyllium (an exampled would be                            Metamucil).                           - Continue present medications.                           - Await pathology results.                           - Repeat colonoscopy in 3 years for surveillance if                            all 3 polyps are adenomas. Otherwise, repeat                            colonoscopy in 5 years.                           - Emerging evidence supports eating a diet of                            fruits, vegetables, grains, calcium, and yogurt                            while reducing red meat and alcohol may reduce the  risk of colon cancer.                           - Given these results, all first degree relatives                            (brothers, sisters, children, parents) should start                            colon cancer screening at age 71.                           - Thank you for allowing me to be involved in your                            colon cancer prevention. Thornton Park MD, MD 12/09/2020 2:01:17 PM This report has been signed electronically.

## 2020-12-09 NOTE — Telephone Encounter (Signed)
We don't allow the use of protein powder the clear liquid day. You may have a protein liquid drink as long as it is clear!

## 2020-12-09 NOTE — Progress Notes (Signed)
Called to room to assist during endoscopic procedure.  Patient ID and intended procedure confirmed with present staff. Received instructions for my participation in the procedure from the performing physician.  

## 2020-12-09 NOTE — Progress Notes (Signed)
pt tolerated well. VSS. awake and to recovery. Report given to RN.  

## 2020-12-09 NOTE — Patient Instructions (Signed)
Handouts provided on polyps, diverticulosis, hemorrhoids and high-fiber diet.   Follow a high fiber diet. Drink at least 64 ounces of water daily. Add a daily stool bulking agent such as psyllium (an example would be Metamucil).  YOU HAD AN ENDOSCOPIC PROCEDURE TODAY AT [facility]:   Refer to the procedure report that was given to you for any specific questions about what was found during the examination.  If the procedure report does not answer your questions, please call your gastroenterologist to clarify.  If you requested that your care partner not be given the details of your procedure findings, then the procedure report has been included in a sealed envelope for you to review at your convenience later.  YOU SHOULD EXPECT: Some feelings of bloating in the abdomen. Passage of more gas than usual.  Walking can help get rid of the air that was put into your GI tract during the procedure and reduce the bloating. If you had a lower endoscopy (such as a colonoscopy or flexible sigmoidoscopy) you may notice spotting of blood in your stool or on the toilet paper. If you underwent a bowel prep for your procedure, you may not have a normal bowel movement for a few days.  Please Note:  You might notice some irritation and congestion in your nose or some drainage.  This is from the oxygen used during your procedure.  There is no need for concern and it should clear up in a day or so.  SYMPTOMS TO REPORT IMMEDIATELY:   Following lower endoscopy (colonoscopy or flexible sigmoidoscopy):  Excessive amounts of blood in the stool  Significant tenderness or worsening of abdominal pains  Swelling of the abdomen that is new, acute  Fever of 100F or higher  For urgent or emergent issues, a gastroenterologist can be reached at any hour by calling [phone]. Do not use MyChart messaging for urgent concerns.    DIET:  We do recommend a small meal at first, but then you may proceed to your  regular diet.  Drink plenty of fluids but you should avoid alcoholic beverages for 24 hours.  ACTIVITY:  You should plan to take it easy for the rest of today and you should NOT DRIVE or use heavy machinery until tomorrow (because of the sedation medicines used during the test).    FOLLOW UP: Our staff will call the number listed on your records 48-72 hours following your procedure to check on you and address any questions or concerns that you may have regarding the information given to you following your procedure. If we do not reach you, we will leave a message.  We will attempt to reach you two times.  During this call, we will ask if you have developed any symptoms of COVID 19. If you develop any symptoms (ie: fever, flu-like symptoms, shortness of breath, cough etc.) before then, please call [phone].  If you test positive for Covid 19 in the 2 weeks post procedure, please call and report this information to [place].    If any biopsies were taken you will be contacted by phone or by letter within the next 1-3 weeks.  Please call us at [phone] if you have not heard about the biopsies in 3 weeks.    SIGNATURES/CONFIDENTIALITY: You and/or your care partner have signed paperwork which will be entered into your electronic medical record.  These signatures attest to the fact that that the information above on your After Visit Summary has been  reviewed and is understood.  Full responsibility of the confidentiality of this discharge information lies with you and/or your care-partner.

## 2020-12-09 NOTE — Progress Notes (Signed)
Vs SM Pt's states no medical or surgical changes since previsit or office visit.

## 2020-12-11 ENCOUNTER — Telehealth: Payer: Self-pay | Admitting: *Deleted

## 2020-12-11 NOTE — Telephone Encounter (Signed)
  Follow up Call-  Call back number 12/09/2020  Post procedure Call Back phone  # [phone]  Permission to leave phone message Yes  Some recent data might be hidden     Patient questions:  Do you have a fever, pain , or abdominal swelling? No. Pain Score  0 *  Have you tolerated food without any problems? Yes.    Have you been able to return to your normal activities? Yes.    Do you have any questions about your discharge instructions: Diet   No. Medications  No. Follow up visit  No.  Do you have questions or concerns about your Care? No.  Actions: * If pain score is 4 or above: No action needed, pain <4.  1. Have you developed a fever since your procedure? no  2.   Have you had an respiratory symptoms (SOB or cough) since your procedure? no  3.   Have you tested positive for COVID 19 since your procedure no  4.   Have you had any family members/close contacts diagnosed with the COVID 19 since your procedure?  no   If yes to any of these questions please route to Joylene John, RN and Joella Prince, RN

## 2020-12-20 ENCOUNTER — Encounter: Payer: Self-pay | Admitting: Gastroenterology

## 2020-12-31 DIAGNOSIS — G40909 Epilepsy, unspecified, not intractable, without status epilepticus: Secondary | ICD-10-CM | POA: Insufficient documentation

## 2021-01-13 ENCOUNTER — Ambulatory Visit: Payer: Medicare Other | Attending: Critical Care Medicine

## 2021-01-13 ENCOUNTER — Other Ambulatory Visit: Payer: Medicare Other

## 2021-01-13 DIAGNOSIS — Z20822 Contact with and (suspected) exposure to covid-19: Secondary | ICD-10-CM

## 2021-01-14 LAB — SARS-COV-2, NAA 2 DAY TAT

## 2021-01-14 LAB — NOVEL CORONAVIRUS, NAA: SARS-CoV-2, NAA: NOT DETECTED

## 2021-01-16 ENCOUNTER — Encounter: Payer: Self-pay | Admitting: Family Medicine

## 2021-01-16 ENCOUNTER — Ambulatory Visit: Payer: Medicare Other | Admitting: Family Medicine

## 2021-01-16 ENCOUNTER — Other Ambulatory Visit: Payer: Self-pay

## 2021-01-16 VITALS — BP 110/62 | HR 65 | Temp 97.7°F | Ht 66.0 in | Wt 217.0 lb

## 2021-01-16 DIAGNOSIS — J069 Acute upper respiratory infection, unspecified: Secondary | ICD-10-CM | POA: Diagnosis not present

## 2021-01-16 MED ORDER — BENZONATATE 200 MG PO CAPS: 200.0000 mg | ORAL_CAPSULE | Freq: Two times a day (BID) | ORAL | 0 refills | Status: DC | PRN

## 2021-01-16 MED ORDER — AZITHROMYCIN 250 MG PO TABS: ORAL_TABLET | ORAL | 0 refills | Status: DC

## 2021-01-16 MED ORDER — ALBUTEROL SULFATE HFA 108 (90 BASE) MCG/ACT IN AERS: 2.0000 | INHALATION_SPRAY | Freq: Four times a day (QID) | RESPIRATORY_TRACT | 0 refills | Status: DC | PRN

## 2021-01-16 NOTE — Patient Instructions (Signed)
1) cool mist humidifier at night 2) honey is great for cough 3) keep up your tea/ricola/robitussin DM 4) sent in tessalon pearls to see if helps cough. Use as needed.  5) zpack to start if not getting better 6) inhaler as needed for chest tightness.   Let me know if worsening so we can xray you if needed!    Acute Bronchitis, Adult  Acute bronchitis is when air tubes in the lungs (bronchi) suddenly get swollen. The condition can make it hard for you to breathe. In adults, acute bronchitis usually goes away within 2 weeks. A cough caused by bronchitis may last up to 3 weeks. Smoking, allergies, and asthma can make the condition worse. What are the causes? This condition is caused by:  Cold and flu viruses. The most common cause of this condition is the virus that causes the common cold.  Bacteria.  Substances that irritate the lungs, including: ? Smoke from cigarettes and other types of tobacco. ? Dust and pollen. ? Fumes from chemicals, gases, or burned fuel. ? Other materials that pollute indoor or outdoor air.  Close contact with someone who has acute bronchitis. What increases the risk? The following factors may make you more likely to develop this condition:  A weak body's defense system. This is also called the immune system.  Any condition that affects your lungs and breathing, such as asthma. What are the signs or symptoms? Symptoms of this condition include:  A cough.  Coughing up clear, yellow, or green mucus.  Wheezing.  Chest congestion.  Shortness of breath.  A fever.  Body aches.  Chills.  A sore throat. How is this treated? Acute bronchitis may go away over time without treatment. Your doctor may recommend:  Drinking more fluids.  Taking a medicine for a fever or cough.  Using a device that gets medicine into your lungs (inhaler).  Using a vaporizer or a humidifier. These are machines that add water or moisture in the air to help with  coughing and poor breathing. Follow these instructions at home: Activity  Get a lot of rest.  Avoid places where there are fumes from chemicals.  Return to your normal activities as told by your doctor. Ask your doctor what activities are safe for you. Lifestyle  Drink enough fluids to keep your pee (urine) pale yellow.  Do not drink alcohol.  Do not use any products that contain nicotine or tobacco, such as cigarettes, e-cigarettes, and chewing tobacco. If you need help quitting, ask your doctor. Be aware that: ? Your bronchitis will get worse if you smoke or breathe in other people's smoke (secondhand smoke). ? Your lungs will heal faster if you quit smoking. General instructions  Take over-the-counter and prescription medicines only as told by your doctor.  Use an inhaler, cool mist vaporizer, or humidifier as told by your doctor.  Rinse your mouth often with salt water. To make salt water, dissolve -1 tsp (3-6 g) of salt in 1 cup (237 mL) of warm water.  Keep all follow-up visits as told by your doctor. This is important.   How is this prevented? To lower your risk of getting this condition again:  Wash your hands often with soap and water. If soap and water are not available, use hand sanitizer.  Avoid contact with people who have cold symptoms.  Try not to touch your mouth, nose, or eyes with your hands.  Make sure to get the flu shot every year.   Contact  a doctor if:  Your symptoms do not get better in 2 weeks.  You vomit more than once or twice.  You have symptoms of loss of fluid from your body (dehydration). These include: ? Dark urine. ? Dry skin or eyes. ? Increased thirst. ? Headaches. ? Confusion. ? Muscle cramps. Get help right away if:  You cough up blood.  You have chest pain.  You have very bad shortness of breath.  You become dehydrated.  You faint or keep feeling like you are going to faint.  You keep vomiting.  You have a very bad  headache.  Your fever or chills get worse. These symptoms may be an emergency. Do not wait to see if the symptoms will go away. Get medical help right away. Call your local emergency services (911 in the [place] Do not drive yourself to the hospital. Summary  Acute bronchitis is when air tubes in the lungs (bronchi) suddenly get swollen. In adults, acute bronchitis usually goes away within 2 weeks.  Take over-the-counter and prescription medicines only as told by your doctor.  Drink enough fluid to keep your pee (urine) pale yellow.  Contact a doctor if your symptoms do not improve after 2 weeks of treatment.  Get help right away if you cough up blood, faint, or have chest pain or shortness of breath. This information is not intended to replace advice given to you by your health care provider. Make sure you discuss any questions you have with your health care provider. Document Revised: 05/05/2019 Document Reviewed: 05/05/2019 Elsevier Patient Education  [place].

## 2021-01-16 NOTE — Progress Notes (Signed)
Patient: Summer [name] MRN: [id number] DOB: 04/17/1951 PCP: Orma Flaming, MD     Subjective:  Chief Complaint  Patient presents with  . Cough    Pt c/o cough x 1.5 weeks, coughing and expectorating green sputum. Has had chills but no fever. Having SOB with coughing. Pt was using Robitussin and Tylenol,stopped 2 days ago. Had COVID test on Monday was Neg.    HPI: The patient is a 70 y.o. female who presents today for a cough that started about 1.5 weeks ago. She had chills a few nights during the first week and a productive cough. She denies any sinus pain or pressure, she does have drainage. No ear pain or sore throat. She has had no fevers. She does have shortness of breath and wheezing. Her son was sick with very similar symptoms and she does watch her grandkids. She has taken robitussin and tylenol as well as somte ginger/lemon tea. Her covid test was negative and she is vaccinated.   Review of Systems  Constitutional: Negative for chills, fatigue and fever.  HENT: Positive for congestion and rhinorrhea. Negative for ear pain, sinus pressure, sinus pain and sore throat.   Eyes: Negative for itching and visual disturbance.  Respiratory: Positive for cough, chest tightness and shortness of breath.   Cardiovascular: Negative for chest pain, palpitations and leg swelling.  Gastrointestinal: Negative for abdominal pain, diarrhea, nausea and vomiting.    Allergies Patient is allergic to bee venom.  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), Alcoholism ([place]), Asymptomatic varicose veins (01/11/2017), Cancer ([place]), Iron deficiency anemia (01/12/2017), Lentigines (01/11/2017), Melanocytic nevi of left lower limb, including hip (01/11/2017), Melanocytic nevi of left upper limb, including shoulder (01/11/2017), Melanocytic nevi of right lower limb, including hip (01/11/2017), Melanocytic nevi of right upper limb, including  shoulder (01/11/2017), Melanocytic nevi of trunk (01/11/2017), Obesity (BMI 30-39.9) (05/21/2017), Osteoporosis, Plantar fasciitis, Primary osteoarthritis, 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  Family History Pateint's family history includes Alcoholism in her father; Colon cancer (age of onset: 45) in her father; Memory loss in her mother; Mitral valve prolapse in her mother; Osteoporosis in her mother.  Social History Patient  reports that she has never smoked. She has never used smokeless tobacco. She reports that she does not drink alcohol and does not use drugs.    Objective: Vitals:   01/16/21 1016  BP: 110/62  Pulse: 65  Temp: 97.7 F (36.5 C)  TempSrc: Temporal  SpO2: 99%  Weight: 217 lb (98.4 kg)  Height: 5\' 6"  (1.676 m)    Body mass index is 35.02 kg/m.  Physical Exam Vitals reviewed.  Constitutional:      Appearance: Normal appearance. She is well-developed and normal weight.  HENT:     Head: Normocephalic and atraumatic.     Comments: No ttp over sinuses     Right Ear: Tympanic membrane, ear canal and external ear normal.     Left Ear: Tympanic membrane, ear canal and external ear normal.     Nose: Congestion present.     Comments: Moderately edematous right nasal turbinate.  Eyes:     Conjunctiva/sclera: Conjunctivae normal.     Pupils: Pupils are equal, round, and reactive to light.  Neck:  Thyroid: No thyromegaly.  Cardiovascular:     Rate and Rhythm: Normal rate and regular rhythm.     Heart sounds: Normal heart sounds. No murmur heard.   Pulmonary:     Effort: Pulmonary effort is normal. No respiratory distress.     Breath sounds: Normal breath sounds. No  stridor. No wheezing, rhonchi or rales.     Comments: Course sounds on anterior chest, but no rales/rhonchi Abdominal:     General: Bowel sounds are normal. There is no distension.     Palpations: Abdomen is soft.     Tenderness: There is no abdominal tenderness.  Musculoskeletal:     Cervical back: Normal range of motion and neck supple.  Lymphadenopathy:     Cervical: No cervical adenopathy.  Skin:    General: Skin is warm and dry.     Findings: No rash.  Neurological:     Mental Status: She is alert and oriented to person, place, and time.     Cranial Nerves: No cranial nerve deficit.     Coordination: Coordination normal.     Deep Tendon Reflexes: Reflexes normal.  Psychiatric:        Behavior: Behavior normal.        Assessment/plan:  1. URI, acute Appears to likely be bronchitis. Conservative therapy with cool mist humidifier at night, rest/fluids, and honey daily. Recommend 1 tablespoon/day. Also recommended over the counter anti tussive medication: robitussin DM during the day and could do a nyquil at night. Sending in tessalon pearls prn for cough. Discussed viral in nature and no indication for antibiotics at this point. Will also send in albuterol inhaler prn since she is tight and course in anterior superior aspect of lungs. I did send in a pocket px for a zpack to start since she is going out of town if worsening symptoms.  Precautions given for worsening symptoms, fever, shortness of breath to let us know immediately as we will need to do a CXR.    This visit occurred during the SARS-CoV-2 public health emergency.  Safety protocols were in place, including screening questions prior to the visit, additional usage of staff PPE, and extensive cleaning of exam room while observing appropriate contact time as indicated for disinfecting solutions.     Return if symptoms worsen or fail to improve.     Orma Flaming, MD [place]  01/16/2021

## 2021-01-17 ENCOUNTER — Encounter: Payer: Self-pay | Admitting: Family Medicine

## 2021-01-18 ENCOUNTER — Encounter: Payer: Self-pay | Admitting: Family Medicine

## 2021-02-27 ENCOUNTER — Encounter: Payer: Self-pay | Admitting: Family Medicine

## 2021-02-28 ENCOUNTER — Other Ambulatory Visit: Payer: Self-pay

## 2021-02-28 DIAGNOSIS — G40209 Localization-related (focal) (partial) symptomatic epilepsy and epileptic syndromes with complex partial seizures, not intractable, without status epilepticus: Secondary | ICD-10-CM

## 2021-02-28 MED ORDER — LAMOTRIGINE 150 MG PO TABS: 150.0000 mg | ORAL_TABLET | Freq: Two times a day (BID) | ORAL | 0 refills | Status: DC

## 2021-05-12 ENCOUNTER — Ambulatory Visit: Payer: Medicare Other | Admitting: Physician Assistant

## 2021-05-28 ENCOUNTER — Other Ambulatory Visit: Payer: Self-pay | Admitting: Family Medicine

## 2021-05-28 DIAGNOSIS — G40209 Localization-related (focal) (partial) symptomatic epilepsy and epileptic syndromes with complex partial seizures, not intractable, without status epilepticus: Secondary | ICD-10-CM

## 2021-06-16 ENCOUNTER — Ambulatory Visit: Payer: Medicare Other | Admitting: Physician Assistant

## 2021-06-16 ENCOUNTER — Encounter: Payer: Self-pay | Admitting: Physician Assistant

## 2021-06-16 ENCOUNTER — Other Ambulatory Visit: Payer: Self-pay

## 2021-06-16 VITALS — BP 106/71 | HR 68 | Temp 98.4°F | Ht 66.0 in | Wt 202.2 lb

## 2021-06-16 DIAGNOSIS — M810 Age-related osteoporosis without current pathological fracture: Secondary | ICD-10-CM

## 2021-06-16 DIAGNOSIS — E782 Mixed hyperlipidemia: Secondary | ICD-10-CM

## 2021-06-16 DIAGNOSIS — E559 Vitamin D deficiency, unspecified: Secondary | ICD-10-CM | POA: Diagnosis not present

## 2021-06-16 DIAGNOSIS — G40209 Localization-related (focal) (partial) symptomatic epilepsy and epileptic syndromes with complex partial seizures, not intractable, without status epilepticus: Secondary | ICD-10-CM

## 2021-06-16 DIAGNOSIS — F4321 Adjustment disorder with depressed mood: Secondary | ICD-10-CM

## 2021-06-16 LAB — COMPREHENSIVE METABOLIC PANEL
ALT: 12 U/L (ref 0–35)
AST: 19 U/L (ref 0–37)
Albumin: 4.4 g/dL (ref 3.5–5.2)
Alkaline Phosphatase: 73 U/L (ref 39–117)
BUN: 15 mg/dL (ref 6–23)
CO2: 27 mEq/L (ref 19–32)
Calcium: 9.5 mg/dL (ref 8.4–10.5)
Chloride: 101 mEq/L (ref 96–112)
Creatinine, Ser: 0.7 mg/dL (ref 0.40–1.20)
GFR: 88.05 mL/min (ref >60.00)
Glucose, Bld: 94 mg/dL (ref 70–99)
Potassium: 3.7 mEq/L (ref 3.5–5.1)
Sodium: 137 mEq/L (ref 135–145)
Total Bilirubin: 0.4 mg/dL (ref 0.2–1.2)
Total Protein: 7.2 g/dL (ref 6.0–8.3)

## 2021-06-16 LAB — LIPID PANEL
Cholesterol: 188 mg/dL (ref 0–200)
HDL: 59.9 mg/dL (ref >39.00)
LDL Cholesterol: 115 mg/dL — ABNORMAL HIGH (ref 0–99)
NonHDL: 128.41
Total CHOL/HDL Ratio: 3
Triglycerides: 68 mg/dL (ref 0.0–149.0)
VLDL: 13.6 mg/dL (ref 0.0–40.0)

## 2021-06-16 LAB — CBC WITH DIFFERENTIAL/PLATELET
Basophils Absolute: 0 10*3/uL (ref 0.0–0.1)
Basophils Relative: 0.3 % (ref 0.0–3.0)
Eosinophils Absolute: 0.1 10*3/uL (ref 0.0–0.7)
Eosinophils Relative: 1 % (ref 0.0–5.0)
HCT: 34.1 % — ABNORMAL LOW (ref 36.0–46.0)
Hemoglobin: 10.6 g/dL — ABNORMAL LOW (ref 12.0–15.0)
Lymphocytes Relative: 40.2 % (ref 12.0–46.0)
Lymphs Abs: 2.1 10*3/uL (ref 0.7–4.0)
MCHC: 31 g/dL (ref 30.0–36.0)
MCV: 74.3 fl — ABNORMAL LOW (ref 78.0–100.0)
Monocytes Absolute: 0.4 10*3/uL (ref 0.1–1.0)
Monocytes Relative: 8.2 % (ref 3.0–12.0)
Neutro Abs: 2.6 10*3/uL (ref 1.4–7.7)
Neutrophils Relative %: 50.3 % (ref 43.0–77.0)
Platelets: 278 10*3/uL (ref 150.0–400.0)
RBC: 4.59 Mil/uL (ref 3.87–5.11)
RDW: 19.5 % — ABNORMAL HIGH (ref 11.5–15.5)
WBC: 5.2 10*3/uL (ref 4.0–10.5)

## 2021-06-16 LAB — VITAMIN D 25 HYDROXY (VIT D DEFICIENCY, FRACTURES): VITD: 45.7 ng/mL (ref 30.00–100.00)

## 2021-06-16 NOTE — Patient Instructions (Addendum)
Good to meet you today! Please go to the lab for blood work and I will send results through [facility]. Referral to counseling. Bone density order placed.   Call with any concerns. See you back in 6 months.

## 2021-06-16 NOTE — Progress Notes (Signed)
Established Patient Office Visit  Subjective:  Patient ID: Summer [name], female    DOB: 1951-06-16  Age: 70 y.o. MRN: [id number]  CC:  Chief Complaint  Patient presents with   Transitions Of Care   Annual Exam    HPI Summer [name] presents for transition of care from Dr. Rogers Blocker. 6 month recheck.   Acute concerns:  -Mother passed away in January 15, 2020 from Alzheimer's Dementia. She would like referral to a counselor.  -She needs referral to have bone density scan done as well.  -Blood work recheck. -Planning for [employer] vaccine at [facility].  See A/P for chronic conditions addressed as well.   Past Medical History:  Diagnosis Date   Adenomatous polyp of sigmoid colon 03/18/2017   Overview:  Added automatically from request for surgery [phone]   Alcoholism ([place])    01/18/18 - no use in 5 years   Asymptomatic varicose veins 01/14/17   Cancer ([place])    skin ca   Iron deficiency anemia 01/12/2017   Last Assessment & Plan:  Given script for ferrous sulfate and order for repeat cbc with diff and iron studies to be done in 1 month Will also refer pt back to [facility] for repeat colonoscopy and to dietician for nutritional counseling, per pt request   Lentigines 2017/01/14   Melanocytic nevi of left lower limb, including hip Jan 14, 2017   Melanocytic nevi of left upper limb, including shoulder January 14, 2017   Melanocytic nevi of right lower limb, including hip January 14, 2017   Melanocytic nevi of right upper limb, including shoulder 01/14/17   Melanocytic nevi of trunk Jan 14, 2017   Obesity (BMI 30-39.9) 05/21/2017   Osteoporosis    Plantar fasciitis    Primary osteoarthritis, right shoulder 12/11/2016   Seizure ([place])    LAST SEIZURE- 01/15/15 per pt   Sleep apnea    no cpap per pt   Unilateral primary osteoarthritis, left knee 12/11/2016    Past Surgical History:  Procedure Laterality Date   BARIATRIC SURGERY     COLONOSCOPY  03/18/2017   in [place].   ECTOPIC PREGNANCY SURGERY     POLYPECTOMY      SIGMOIDOSCOPY     in [place].   TOTAL KNEE ARTHROPLASTY Left 09/07/2017   Procedure: LEFT TOTAL KNEE ARTHROPLASTY;  Surgeon: Paralee Cancel, MD;  Location: [facility];  Service: Orthopedics;  Laterality: Left;  Adductor Block    Family History  Problem Relation Age of Onset   Mitral valve prolapse Mother    Osteoporosis Mother    Memory loss Mother    Colon cancer Father 61   Alcoholism Father    Colon polyps Neg Hx    Esophageal cancer Neg Hx    Rectal cancer Neg Hx    Stomach cancer Neg Hx     Social History   Socioeconomic History   Marital status: Married    Spouse name: Not on file   Number of children: 1   Years of education: college   Highest education level: Bachelor's degree (e.g., BA, AB, BS)  Occupational History   Occupation: Retired  Tobacco Use   Smoking status: Never   Smokeless tobacco: Never  Vaping Use   Vaping Use: Never used  Substance and Sexual Activity   Alcohol use: No   Drug use: No   Sexual activity: Never  Other Topics Concern   Not on file  Social History Narrative   Lives at home with husband.   Right-handed.   3 cups caffeine per  day.   Cares for 2 grandchildren 3 days per week    Social Determinants of Health   Financial Resource Strain: Not on file  Food Insecurity: Not on file  Transportation Needs: Not on file  Physical Activity: Not on file  Stress: Not on file  Social Connections: Not on file  Intimate Partner Violence: Not on file    Outpatient Medications Prior to Visit  Medication Sig Dispense Refill   Acetaminophen (TYLENOL PO) Take by mouth as needed.     albuterol (VENTOLIN HFA) 108 (90 Base) MCG/ACT inhaler Inhale 2 puffs into the lungs every 6 (six) hours as needed for wheezing or shortness of breath. 8 g 0   Calcium-Vitamin D-Vitamin K [id number] MG-UNT-MCG TABS Take 1 tablet by mouth 2 (two) times daily. Also includes Vitamin C     Cholecalciferol 100 MCG (4000 UT) CAPS Take 4,000 Units by mouth daily.      denosumab (PROLIA) 60 MG/ML SOLN injection Inject 60 mg into the skin every 6 (six) months. Administer in upper arm, thigh, or abdomen     EPINEPHrine 0.3 mg/0.3 mL IJ SOAJ injection Inject 0.3 mLs (0.3 mg total) into the muscle as needed for anaphylaxis. 2 each 1   lamoTRIgine (LAMICTAL) 150 MG tablet TAKE ONE TABLET BY MOUTH TWICE A DAY 180 tablet 0   levETIRAcetam (KEPPRA) 500 MG tablet Take 1 tablet twice daily 180 tablet 3   Omega-3 Fatty Acids (OMEGA 3 PO) Take 1 capsule by mouth daily.     vitamin E 600 UNIT capsule Take 600 Units by mouth daily.     azithromycin (ZITHROMAX) 250 MG tablet 2 pills today and then 1 pill days 2-5. 6 tablet 0   benzonatate (TESSALON) 200 MG capsule Take 1 capsule (200 mg total) by mouth 2 (two) times daily as needed for cough. 20 capsule 0   No facility-administered medications prior to visit.    Allergies  Allergen Reactions   Bee Venom Swelling    ROS Review of Systems REFER TO HPI FOR PERTINENT POSITIVES AND NEGATIVES    Objective:    Physical Exam Vitals and nursing note reviewed.  Constitutional:      General: She is not in acute distress.    Appearance: Normal appearance. She is normal weight.  HENT:     Head: Normocephalic.     Right Ear: External ear normal.     Left Ear: External ear normal.     Nose: Nose normal.     Mouth/Throat:     Mouth: Mucous membranes are moist.  Eyes:     Extraocular Movements: Extraocular movements intact.     Conjunctiva/sclera: Conjunctivae normal.     Pupils: Pupils are equal, round, and reactive to light.  Cardiovascular:     Rate and Rhythm: Normal rate and regular rhythm.     Pulses: Normal pulses.     Heart sounds: No murmur heard. Pulmonary:     Effort: Pulmonary effort is normal.     Breath sounds: Normal breath sounds.  Abdominal:     Tenderness: There is no abdominal tenderness.  Musculoskeletal:        General: Normal range of motion.     Cervical back: Normal range of motion.   Skin:    General: Skin is warm.  Neurological:     General: No focal deficit present.     Mental Status: She is alert and oriented to person, place, and time.     Gait: Gait normal.  Psychiatric:        Mood and Affect: Mood normal.        Behavior: Behavior normal.    BP 106/71   Pulse 68   Temp 98.4 F (36.9 C)   Ht '5\' 6"'$  (1.676 m)   Wt 202 lb 3.2 oz (91.7 kg)   SpO2 98%   BMI 32.64 kg/m  Wt Readings from Last 3 Encounters:  06/16/21 202 lb 3.2 oz (91.7 kg)  01/16/21 217 lb (98.4 kg)  12/09/20 220 lb (99.8 kg)     Health Maintenance Due  Topic Date Due   Zoster Vaccines- Shingrix (1 of 2) Never done   DEXA SCAN  02/21/2021   COVID-19 Vaccine (5 - Booster for Pfizer series) 05/31/2021   INFLUENZA VACCINE  05/26/2021    There are no preventive care reminders to display for this patient.  Lab Results  Component Value Date   TSH 3.04 08/18/2019   Lab Results  Component Value Date   WBC 5.1 08/18/2019   HGB 14.2 08/18/2019   HCT 43.1 08/18/2019   MCV 93.7 08/18/2019   PLT 230.0 08/18/2019   Lab Results  Component Value Date   NA 139 08/18/2019   K 4.3 08/18/2019   CO2 29 08/18/2019   GLUCOSE 91 08/18/2019   BUN 16 08/18/2019   CREATININE 0.56 08/18/2019   BILITOT 0.4 08/18/2019   ALKPHOS 86 08/18/2019   AST 21 08/18/2019   ALT 19 08/18/2019   PROT 6.5 08/18/2019   ALBUMIN 4.4 08/18/2019   CALCIUM 8.4 08/18/2019   ANIONGAP 11 11/04/2017   GFR 107.69 08/18/2019   Lab Results  Component Value Date   CHOL 200 08/18/2019   Lab Results  Component Value Date   HDL 63.70 08/18/2019   Lab Results  Component Value Date   LDLCALC 126 (H) 08/18/2019   Lab Results  Component Value Date   TRIG 49.0 08/18/2019   Lab Results  Component Value Date   CHOLHDL 3 08/18/2019   No results found for: HGBA1C    Assessment & Plan:   Problem List Items Addressed This Visit       Nervous and Auditory   Partial symptomatic epilepsy with complex  partial seizures, not intractable, without status epilepticus ([place]) - Primary   Relevant Orders   CBC with Differential/Platelet   Comprehensive metabolic panel     Musculoskeletal and Integument   Osteoporosis   Relevant Orders   CBC with Differential/Platelet   Comprehensive metabolic panel   DG Bone Density     Other   Mixed hyperlipidemia   Relevant Orders   Lipid panel   Vitamin D deficiency   Relevant Orders   VITAMIN D 25 Hydroxy (Vit-D Deficiency, Fractures)   Other Visit Diagnoses     Feeling grief       Relevant Orders   Ambulatory referral to Psychology       No orders of the defined types were placed in this encounter.   Follow-up: No follow-ups on file.   1. Partial symptomatic epilepsy with complex partial seizures, not intractable, without status epilepticus ([place]) She has not had a seizure since 2016.  She is currently stable on Lamictal and Keppra.  She will call for refills as needed.  Managed by PCP, no longer sees neurology.  2. Osteoporosis without current pathological fracture, unspecified osteoporosis type History Per stated.  We will send order to repeat DEXA scan this year.  She is taking calcium and vitamin D daily.  She also is on Prolia every 6 months.  3. Mixed hyperlipidemia We will recheck this lab today.  She is taking omega-3 fatty acid supplement daily.  4. Vitamin D deficiency We will recheck lab.  She is taking a vitamin D supplement daily.  5. Feeling grief Secondary to loss of mother and 2021 to Alzheimer's disease.  It was also during the time of the COVID pandemic and the last time she saw her mother they were all in PPE and it was a very difficult time.    Jiovanny Burdell M Joice Nazario, PA-C

## 2021-06-19 ENCOUNTER — Encounter: Payer: Self-pay | Admitting: Physician Assistant

## 2021-06-19 NOTE — Telephone Encounter (Signed)
See notes on labs. 

## 2021-06-20 ENCOUNTER — Encounter: Payer: Self-pay | Admitting: Physician Assistant

## 2021-06-25 ENCOUNTER — Other Ambulatory Visit: Payer: Self-pay | Admitting: Physician Assistant

## 2021-06-25 MED ORDER — IRON (FERROUS SULFATE) 325 (65 FE) MG PO TABS: 325.0000 mg | ORAL_TABLET | Freq: Every day | ORAL | 2 refills | Status: DC

## 2021-06-25 NOTE — Telephone Encounter (Signed)
Already responded to via lab results.

## 2021-06-27 ENCOUNTER — Other Ambulatory Visit: Payer: Self-pay

## 2021-06-27 DIAGNOSIS — R7989 Other specified abnormal findings of blood chemistry: Secondary | ICD-10-CM

## 2021-06-29 ENCOUNTER — Encounter: Payer: Self-pay | Admitting: Physician Assistant

## 2021-07-02 ENCOUNTER — Other Ambulatory Visit: Payer: Self-pay

## 2021-07-02 ENCOUNTER — Telehealth: Payer: Self-pay

## 2021-07-02 DIAGNOSIS — R7989 Other specified abnormal findings of blood chemistry: Secondary | ICD-10-CM

## 2021-07-02 NOTE — Telephone Encounter (Signed)
See results note. 

## 2021-07-02 NOTE — Telephone Encounter (Signed)
Patient is returning a call from [place].

## 2021-07-09 ENCOUNTER — Telehealth: Payer: Self-pay | Admitting: Physician Assistant

## 2021-07-09 NOTE — Telephone Encounter (Signed)
Left message for patient to schedule Annual Wellness Visit.  Please schedule with Nurse Health Advisor Tina Betterson, RN at [facility].  Please call [phone] ask for Kathy  

## 2021-07-15 ENCOUNTER — Ambulatory Visit: Payer: Self-pay | Admitting: Psychologist

## 2021-07-28 ENCOUNTER — Ambulatory Visit (INDEPENDENT_AMBULATORY_CARE_PROVIDER_SITE_OTHER): Payer: Medicare Other

## 2021-07-28 ENCOUNTER — Other Ambulatory Visit: Payer: Self-pay

## 2021-07-28 VITALS — BP 108/62 | HR 79 | Temp 97.2°F | Wt 196.8 lb

## 2021-07-28 DIAGNOSIS — Z0000 Encounter for general adult medical examination without abnormal findings: Secondary | ICD-10-CM | POA: Diagnosis not present

## 2021-07-28 NOTE — Patient Instructions (Addendum)
[name] , Thank you for taking time to come for your Medicare Wellness Visit. I appreciate your ongoing commitment to your health goals. Please review the following plan we discussed and let me know if I can assist you in the future.   Screening recommendations/referrals: Colonoscopy: Done 12/09/20 repeat every 5 years  Mammogram: done 07/31/20 repeat every year  Bone Density: Done 02/22/20 repeat every 2 years  Recommended yearly ophthalmology/optometry visit for glaucoma screening and checkup Recommended yearly dental visit for hygiene and checkup  Vaccinations: Influenza vaccine: Up to date Pneumococcal vaccine: Completed  Tdap vaccine: Done 08/17/18 repeat every 10 years  Shingles vaccine: Shingrix discussed. Please contact your pharmacy for coverage information.    Covid-19:Completed 2/8, 3/5, 07/31/20 & 01/29/21  Advanced directives: Please bring a copy of your health care power of attorney and living will to the office at your convenience.  Conditions/risks identified: Lose weight and get stronger  Next appointment: Follow up in one year for your annual wellness visit  Pt can follow up with [facility] for hearing exams    Preventive Care 50 Years and Older, Female Preventive care refers to lifestyle choices and visits with your health care provider that can promote health and wellness. What does preventive care include? A yearly physical exam. This is also called an annual well check. Dental exams once or twice a year. Routine eye exams. Ask your health care provider how often you should have your eyes checked. Personal lifestyle choices, including: Daily care of your teeth and gums. Regular physical activity. Eating a healthy diet. Avoiding tobacco and drug use. Limiting alcohol use. Practicing safe sex. Taking low-dose aspirin every day. Taking vitamin and mineral supplements as recommended by your health care provider. What happens during an annual well check? The services  and screenings done by your health care provider during your annual well check will depend on your age, overall health, lifestyle risk factors, and family history of disease. Counseling  Your health care provider may ask you questions about your: Alcohol use. Tobacco use. Drug use. Emotional well-being. Home and relationship well-being. Sexual activity. Eating habits. History of falls. Memory and ability to understand (cognition). Work and work Statistician. Reproductive health. Screening  You may have the following tests or measurements: Height, weight, and BMI. Blood pressure. Lipid and cholesterol levels. These may be checked every 5 years, or more frequently if you are over 40 years old. Skin check. Lung cancer screening. You may have this screening every year starting at age 61 if you have a 30-pack-year history of smoking and currently smoke or have quit within the past 15 years. Fecal occult blood test (FOBT) of the stool. You may have this test every year starting at age 21. Flexible sigmoidoscopy or colonoscopy. You may have a sigmoidoscopy every 5 years or a colonoscopy every 10 years starting at age 81. Hepatitis C blood test. Hepatitis B blood test. Sexually transmitted disease (STD) testing. Diabetes screening. This is done by checking your blood sugar (glucose) after you have not eaten for a while (fasting). You may have this done every 1-3 years. Bone density scan. This is done to screen for osteoporosis. You may have this done starting at age 63. Mammogram. This may be done every 1-2 years. Talk to your health care provider about how often you should have regular mammograms. Talk with your health care provider about your test results, treatment options, and if necessary, the need for more tests. Vaccines  Your health care provider may  recommend certain vaccines, such as: Influenza vaccine. This is recommended every year. Tetanus, diphtheria, and acellular pertussis  (Tdap, Td) vaccine. You may need a Td booster every 10 years. Zoster vaccine. You may need this after age 37. Pneumococcal 13-valent conjugate (PCV13) vaccine. One dose is recommended after age 59. Pneumococcal polysaccharide (PPSV23) vaccine. One dose is recommended after age 23. Talk to your health care provider about which screenings and vaccines you need and how often you need them. This information is not intended to replace advice given to you by your health care provider. Make sure you discuss any questions you have with your health care provider. Document Released: 11/08/2015 Document Revised: 07/01/2016 Document Reviewed: 08/13/2015 Elsevier Interactive Patient Education  2017 [place] Prevention in the Home Falls can cause injuries. They can happen to people of all ages. There are many things you can do to make your home safe and to help prevent falls. What can I do on the outside of my home? Regularly fix the edges of walkways and driveways and fix any cracks. Remove anything that might make you trip as you walk through a door, such as a raised step or threshold. Trim any bushes or trees on the path to your home. Use bright outdoor lighting. Clear any walking paths of anything that might make someone trip, such as rocks or tools. Regularly check to see if handrails are loose or broken. Make sure that both sides of any steps have handrails. Any raised decks and porches should have guardrails on the edges. Have any leaves, snow, or ice cleared regularly. Use sand or salt on walking paths during winter. Clean up any spills in your garage right away. This includes oil or grease spills. What can I do in the bathroom? Use night lights. Install grab bars by the toilet and in the tub and shower. Do not use towel bars as grab bars. Use non-skid mats or decals in the tub or shower. If you need to sit down in the shower, use a plastic, non-slip stool. Keep the floor dry. Clean  up any water that spills on the floor as soon as it happens. Remove soap buildup in the tub or shower regularly. Attach bath mats securely with double-sided non-slip rug tape. Do not have throw rugs and other things on the floor that can make you trip. What can I do in the bedroom? Use night lights. Make sure that you have a light by your bed that is easy to reach. Do not use any sheets or blankets that are too big for your bed. They should not hang down onto the floor. Have a firm chair that has side arms. You can use this for support while you get dressed. Do not have throw rugs and other things on the floor that can make you trip. What can I do in the kitchen? Clean up any spills right away. Avoid walking on wet floors. Keep items that you use a lot in easy-to-reach places. If you need to reach something above you, use a strong step stool that has a grab bar. Keep electrical cords out of the way. Do not use floor polish or wax that makes floors slippery. If you must use wax, use non-skid floor wax. Do not have throw rugs and other things on the floor that can make you trip. What can I do with my stairs? Do not leave any items on the stairs. Make sure that there are handrails on both sides of  the stairs and use them. Fix handrails that are broken or loose. Make sure that handrails are as long as the stairways. Check any carpeting to make sure that it is firmly attached to the stairs. Fix any carpet that is loose or worn. Avoid having throw rugs at the top or bottom of the stairs. If you do have throw rugs, attach them to the floor with carpet tape. Make sure that you have a light switch at the top of the stairs and the bottom of the stairs. If you do not have them, ask someone to add them for you. What else can I do to help prevent falls? Wear shoes that: Do not have high heels. Have rubber bottoms. Are comfortable and fit you well. Are closed at the toe. Do not wear sandals. If you  use a stepladder: Make sure that it is fully opened. Do not climb a closed stepladder. Make sure that both sides of the stepladder are locked into place. Ask someone to hold it for you, if possible. Clearly mark and make sure that you can see: Any grab bars or handrails. First and last steps. Where the edge of each step is. Use tools that help you move around (mobility aids) if they are needed. These include: Canes. Walkers. Scooters. Crutches. Turn on the lights when you go into a dark area. Replace any light bulbs as soon as they burn out. Set up your furniture so you have a clear path. Avoid moving your furniture around. If any of your floors are uneven, fix them. If there are any pets around you, be aware of where they are. Review your medicines with your doctor. Some medicines can make you feel dizzy. This can increase your chance of falling. Ask your doctor what other things that you can do to help prevent falls. This information is not intended to replace advice given to you by your health care provider. Make sure you discuss any questions you have with your health care provider. Document Released: 08/08/2009 Document Revised: 03/19/2016 Document Reviewed: 11/16/2014 Elsevier Interactive Patient Education  2017 [employer].

## 2021-07-28 NOTE — Progress Notes (Addendum)
Subjective:   Summer [name] is a 70 y.o. female who presents for Medicare Annual (Subsequent) preventive examination.  Review of Systems     Cardiac Risk Factors include: advanced age (>36men, >75 women);dyslipidemia;obesity (BMI >30kg/m2)     Objective:    Today's Vitals   07/28/21 0809  BP: 108/62  Pulse: 79  Temp: (!) 97.2 F (36.2 C)  SpO2: 98%  Weight: 196 lb 12.8 oz (89.3 kg)   Body mass index is 31.76 kg/m.  Advanced Directives 07/28/2021 09/14/2019 09/08/2018 11/04/2017 09/07/2017 09/07/2017 09/01/2017  Does Patient Have a Medical Advance Directive? Yes Yes Yes Yes Yes Yes Yes  Type of Paramedic of [place];Living will Living will;Healthcare Power of [place] will;Healthcare Power of Attorney Living will;Healthcare Power of Attorney  Does patient want to make changes to medical advance directive? - No - Patient declined - - No - Patient declined No - Patient declined No - Patient declined  Copy of [place] in Chart? No - copy requested No - copy requested - Yes No - copy requested No - copy requested -    Current Medications (verified) Outpatient Encounter Medications as of 07/28/2021  Medication Sig   Acetaminophen (TYLENOL PO) Take by mouth as needed.   albuterol (VENTOLIN HFA) 108 (90 Base) MCG/ACT inhaler Inhale 2 puffs into the lungs every 6 (six) hours as needed for wheezing or shortness of breath.   Calcium-Vitamin D-Vitamin K 678-9381-01 MG-UNT-MCG TABS Take 1 tablet by mouth 2 (two) times daily. Also includes Vitamin C   Cholecalciferol 100 MCG (4000 UT) CAPS Take 4,000 Units by mouth daily.   denosumab (PROLIA) 60 MG/ML SOLN injection Inject 60 mg into the skin every 6 (six) months. Administer in upper arm, thigh, or abdomen   EPINEPHrine 0.3 mg/0.3 mL IJ SOAJ injection Inject 0.3 mLs (0.3 mg total) into the muscle as needed for  anaphylaxis.   Iron, Ferrous Sulfate, 325 (65 Fe) MG TABS Take 325 mg by mouth daily. Take with Vitamin C.   lamoTRIgine (LAMICTAL) 150 MG tablet TAKE ONE TABLET BY MOUTH TWICE A DAY   levETIRAcetam (KEPPRA) 500 MG tablet Take 1 tablet twice daily   Omega-3 Fatty Acids (OMEGA 3 PO) Take 1 capsule by mouth daily.   vitamin E 600 UNIT capsule Take 600 Units by mouth daily.   No facility-administered encounter medications on file as of 07/28/2021.    Allergies (verified) Bee venom   History: Past Medical History:  Diagnosis Date   Adenomatous polyp of sigmoid colon 03/18/2017   Overview:  Added automatically from request for surgery [id number]   Alcoholism ([place])    01/18/18 - no use in 5 years   Asymptomatic varicose veins 01/11/2017   Cancer ([place])    skin ca   Iron deficiency anemia 01/12/2017   Last Assessment & Plan:  Given script for ferrous sulfate and order for repeat cbc with diff and iron studies to be done in 1 month Will also refer pt back to [facility] for repeat colonoscopy and to dietician for nutritional counseling, per pt request   Lentigines 01/11/2017   Melanocytic nevi of left lower limb, including hip 01/11/2017   Melanocytic nevi of left upper limb, including shoulder 01/11/2017   Melanocytic nevi of right lower limb, including hip 01/11/2017   Melanocytic nevi of right upper limb, including shoulder 01/11/2017   Melanocytic nevi of trunk 01/11/2017   Obesity (BMI  30-39.9) 05/21/2017   Osteoporosis    Plantar fasciitis    Primary osteoarthritis, right shoulder 12/11/2016   Seizure ([place])    LAST SEIZURE- 2016 per pt   Sleep apnea    no cpap per pt   Unilateral primary osteoarthritis, left knee 12/11/2016   Past Surgical History:  Procedure Laterality Date   BARIATRIC SURGERY     COLONOSCOPY  03/18/2017   in [place].   ECTOPIC PREGNANCY SURGERY     POLYPECTOMY     SIGMOIDOSCOPY     in [place].   TOTAL KNEE ARTHROPLASTY Left 09/07/2017   Procedure: LEFT TOTAL KNEE ARTHROPLASTY;   Surgeon: Paralee Cancel, MD;  Location: [facility];  Service: Orthopedics;  Laterality: Left;  Adductor Block   Family History  Problem Relation Age of Onset   Mitral valve prolapse Mother    Osteoporosis Mother    Memory loss Mother    Colon cancer Father 68   Alcoholism Father    Colon polyps Neg Hx    Esophageal cancer Neg Hx    Rectal cancer Neg Hx    Stomach cancer Neg Hx    Social History   Socioeconomic History   Marital status: Married    Spouse name: Not on file   Number of children: 1   Years of education: college   Highest education level: Bachelor's degree (e.g., BA, AB, BS)  Occupational History   Occupation: Retired  Tobacco Use   Smoking status: Never   Smokeless tobacco: Never  Vaping Use   Vaping Use: Never used  Substance and Sexual Activity   Alcohol use: No   Drug use: No   Sexual activity: Never  Other Topics Concern   Not on file  Social History Narrative   Lives at home with husband.   Right-handed.   3 cups caffeine per day.   Cares for 2 grandchildren 3 days per week    Social Determinants of Health   Financial Resource Strain: Low Risk    Difficulty of Paying Living Expenses: Not hard at all  Food Insecurity: No Food Insecurity   Worried About Charity fundraiser in the Last Year: Never true   Arboriculturist in the Last Year: Never true  Transportation Needs: No Transportation Needs   Lack of Transportation (Medical): No   Lack of Transportation (Non-Medical): No  Physical Activity: Sufficiently Active   Days of Exercise per Week: 5 days   Minutes of Exercise per Session: 120 min  Stress: No Stress Concern Present   Feeling of Stress : Not at all  Social Connections: Moderately Integrated   Frequency of Communication with Friends and Family: More than three times a week   Frequency of Social Gatherings with Friends and Family: More than three times a week   Attends Religious Services: Never   Marine scientist or Organizations:  Yes   Attends Music therapist: 1 to 4 times per year   Marital Status: Married    Tobacco Counseling Counseling given: Not Answered   Clinical Intake:  Pre-visit preparation completed: Yes  Pain : No/denies pain     BMI - recorded: 31.76 Nutritional Status: BMI > 30  Obese Nutritional Risks: None Diabetes: No  How often do you need to have someone help you when you read instructions, pamphlets, or other written materials from your doctor or pharmacy?: 1 - Never  Diabetic?No  Interpreter Needed?: No  Information entered by :: Charlott Rakes, LPN  Activities of Daily Living In your present state of health, do you have any difficulty performing the following activities: 07/28/2021  Hearing? Y  Comment slight hearing loss  Vision? N  Difficulty concentrating or making decisions? N  Walking or climbing stairs? N  Dressing or bathing? N  Doing errands, shopping? N  Preparing Food and eating ? N  Using the Toilet? N  In the past six months, have you accidently leaked urine? Y  Comment wears poise pads as needed  Do you have problems with loss of bowel control? N  Managing your Medications? N  Managing your Finances? N  Housekeeping or managing your Housekeeping? N  Some recent data might be hidden    Patient Care Team: Allwardt, Randa Evens, PA-C as PCP - General (Physician Assistant) [facility] as Consulting Physician (Dermatology) Warden Fillers, MD as Consulting Physician (Ophthalmology) Orth[facility], [facility] (Specialist)  Indicate any recent Medical Services you may have received from other than [facility] providers in the past year (date may be approximate).     Assessment:   This is a routine wellness examination for [facility].  Hearing/Vision screen Hearing Screening - Comments:: Pt stated slight loss Vision Screening - Comments:: Pt follows up with Dr Macarthur Critchley for annual eye exams   Dietary issues and exercise activities  discussed: Current Exercise Habits: Home exercise routine;Structured exercise class, Type of exercise: Other - see comments;strength training/weights (yoga), Time (Minutes): > 60, Frequency (Times/Week): 5, Weekly Exercise (Minutes/Week): 0   Goals Addressed             This Visit's Progress    Patient Stated       Continue to lose weight and get stronger        Depression Screen PHQ 2/9 Scores 07/28/2021 08/05/2020 03/29/2020 09/14/2019 09/08/2018 05/20/2017  PHQ - 2 Score 1 0 0 0 0 0    Fall Risk Fall Risk  07/28/2021 08/05/2020 09/14/2019 09/08/2018 05/20/2017  Falls in the past year? 0 0 0 0 No  Number falls in past yr: 0 - - - -  Injury with Fall? 0 - 0 - -  Risk for fall due to : Impaired vision No Fall Risks - - -  Follow up Falls prevention discussed - Falls evaluation completed;Education provided;Falls prevention discussed - -    FALL RISK PREVENTION PERTAINING TO THE HOME:  Any stairs in or around the home? Yes  If so, are there any without handrails? No  Home free of loose throw rugs in walkways, pet beds, electrical cords, etc? Yes  Adequate lighting in your home to reduce risk of falls? Yes   ASSISTIVE DEVICES UTILIZED TO PREVENT FALLS:  Life alert? No  Use of a cane, walker or w/c? No  Grab bars in the bathroom? Yes  Shower chair or bench in shower? No  Elevated toilet seat or a handicapped toilet? No   TIMED UP AND GO:  Was the test performed? Yes .  Length of time to ambulate 10 feet: 10 sec.   Gait steady and fast without use of assistive device  Cognitive Function: MMSE - Mini Mental State Exam 09/08/2018  Not completed: (No Data)     6CIT Screen 07/28/2021  What Year? 0 points  What month? 0 points  What time? 0 points  Count back from 20 0 points  Months in reverse 0 points  Repeat phrase 0 points  Total Score 0    Immunizations Immunization History  Administered Date(s) Administered   Fluad Quad(high  Dose 65+) 07/21/2019   H1N1  09/12/2008   Influenza Split 06/28/2011, 10/29/2012   Influenza, High Dose Seasonal PF 01/01/2015, 09/07/2016, 08/02/2018, 07/26/2020   Influenza, Quadrivalent, Recombinant, Inj, Pf 01/01/2015, 09/07/2016   Influenza,inj,Quad PF,6+ Mos 01/01/2015, 09/07/2016   Influenza,trivalent, recombinat, inj, PF 06/28/2011, 10/29/2012   Influenza-Unspecified 01/01/2015, 09/07/2016, 08/30/2017   PFIZER(Purple Top)SARS-COV-2 Vaccination 12/04/2019, 12/29/2019, 07/31/2020, 01/29/2021   Pneumococcal Conjugate-13 10/01/2016, 08/17/2018   Pneumococcal Polysaccharide-23 12/20/2013, 09/14/2019, 05/17/2021   Tdap 01/27/2012, 09/08/2016, 08/17/2018    TDAP status: Up to date  Flu Vaccine status: Up to date  Pneumococcal vaccine status: Up to date  Covid-19 vaccine status: Completed vaccines  Qualifies for Shingles Vaccine? Yes   Zostavax completed No   Shingrix Completed?: No.    Education has been provided regarding the importance of this vaccine. Patient has been advised to call insurance company to determine out of pocket expense if they have not yet received this vaccine. Advised may also receive vaccine at local pharmacy or Health Dept. Verbalized acceptance and understanding.  Screening Tests Health Maintenance  Topic Date Due   Zoster Vaccines- Shingrix (1 of 2) Never done   DEXA SCAN  02/21/2021   INFLUENZA VACCINE  05/26/2021   COVID-19 Vaccine (5 - Booster for [place] series) 05/31/2021   MAMMOGRAM  07/31/2022   COLONOSCOPY (Pts 45-35yrs Insurance coverage will need to be confirmed)  12/09/2025   TETANUS/TDAP  08/17/2028   Hepatitis C Screening  Completed   HPV VACCINES  Aged Out    Health Maintenance  Health Maintenance Due  Topic Date Due   Zoster Vaccines- Shingrix (1 of 2) Never done   DEXA SCAN  02/21/2021   INFLUENZA VACCINE  05/26/2021   COVID-19 Vaccine (5 - Booster for [place] series) 05/31/2021    Colorectal cancer screening: Type of screening: Colonoscopy. Completed  12/09/20. Repeat every 5 years  Mammogram status: Completed 07/31/20. Repeat every year  Bone Density status: Completed 02/22/20. Results reflect: Bone density results: OSTEOPOROSIS. Repeat every 2 years.    Additional Screening:  Hepatitis C Screening:  Completed 04/13/18  Vision Screening: Recommended annual ophthalmology exams for early detection of glaucoma and other disorders of the eye. Is the patient up to date with their annual eye exam?  Yes  Who is the provider or what is the name of the office in which the patient attends annual eye exams? Dr Macarthur Critchley If pt is not established with a provider, would they like to be referred to a provider to establish care? No .   Dental Screening: Recommended annual dental exams for proper oral hygiene  Community Resource Referral / Chronic Care Management: CRR required this visit?  No   CCM required this visit?  No      Plan:     I have personally reviewed and noted the following in the patient's chart:   Medical and social history Use of alcohol, tobacco or illicit drugs  Current medications and supplements including opioid prescriptions.  Functional ability and status Nutritional status Physical activity Advanced directives List of other physicians Hospitalizations, surgeries, and ER visits in previous 12 months Vitals Screenings to include cognitive, depression, and falls Referrals and appointments  In addition, I have reviewed and discussed with patient certain preventive protocols, quality metrics, and best practice recommendations. A written personalized care plan for preventive services as well as general preventive health recommendations were provided to patient.     Willette Brace, LPN   67/03/1949   Nurse Notes: None

## 2021-08-18 ENCOUNTER — Encounter: Payer: Self-pay | Admitting: Physician Assistant

## 2021-08-24 ENCOUNTER — Encounter: Payer: Self-pay | Admitting: Physician Assistant

## 2021-08-25 ENCOUNTER — Other Ambulatory Visit (INDEPENDENT_AMBULATORY_CARE_PROVIDER_SITE_OTHER): Payer: Medicare Other

## 2021-08-25 ENCOUNTER — Other Ambulatory Visit: Payer: Self-pay

## 2021-08-25 DIAGNOSIS — Z79899 Other long term (current) drug therapy: Secondary | ICD-10-CM

## 2021-08-25 DIAGNOSIS — R7989 Other specified abnormal findings of blood chemistry: Secondary | ICD-10-CM | POA: Diagnosis not present

## 2021-08-25 LAB — IBC + FERRITIN
Ferritin: 7.2 ng/mL — ABNORMAL LOW (ref 10.0–291.0)
Iron: 33 ug/dL — ABNORMAL LOW (ref 42–145)
Saturation Ratios: 6.9 % — ABNORMAL LOW (ref 20.0–50.0)
TIBC: 481.6 ug/dL — ABNORMAL HIGH (ref 250.0–450.0)
Transferrin: 344 mg/dL (ref 212.0–360.0)

## 2021-08-25 LAB — CBC
HCT: 36 % (ref 36.0–46.0)
Hemoglobin: 11.3 g/dL — ABNORMAL LOW (ref 12.0–15.0)
MCHC: 31.4 g/dL (ref 30.0–36.0)
MCV: 79.9 fl (ref 78.0–100.0)
Platelets: 270 10*3/uL (ref 150.0–400.0)
RBC: 4.51 Mil/uL (ref 3.87–5.11)
RDW: 20 % — ABNORMAL HIGH (ref 11.5–15.5)
WBC: 4.6 10*3/uL (ref 4.0–10.5)

## 2021-08-25 LAB — TSH: TSH: 4.02 u[IU]/mL (ref 0.35–5.50)

## 2021-08-28 ENCOUNTER — Encounter: Payer: Self-pay | Admitting: Physician Assistant

## 2021-08-28 NOTE — Telephone Encounter (Signed)
Comment on lab results.

## 2021-08-30 ENCOUNTER — Encounter: Payer: Self-pay | Admitting: Physician Assistant

## 2021-09-01 ENCOUNTER — Ambulatory Visit: Payer: Medicare Other

## 2021-09-07 ENCOUNTER — Encounter: Payer: Self-pay | Admitting: Physician Assistant

## 2021-09-08 ENCOUNTER — Other Ambulatory Visit: Payer: Self-pay

## 2021-09-08 MED ORDER — IRON (FERROUS SULFATE) 325 (65 FE) MG PO TABS: 325.0000 mg | ORAL_TABLET | Freq: Every day | ORAL | 2 refills | Status: DC

## 2021-09-11 ENCOUNTER — Other Ambulatory Visit: Payer: Self-pay | Admitting: Physician Assistant

## 2021-09-11 DIAGNOSIS — D508 Other iron deficiency anemias: Secondary | ICD-10-CM

## 2021-09-15 ENCOUNTER — Other Ambulatory Visit (HOSPITAL_BASED_OUTPATIENT_CLINIC_OR_DEPARTMENT_OTHER): Payer: Self-pay

## 2021-09-15 ENCOUNTER — Ambulatory Visit (INDEPENDENT_AMBULATORY_CARE_PROVIDER_SITE_OTHER): Payer: Medicare Other | Admitting: Psychologist

## 2021-09-15 ENCOUNTER — Ambulatory Visit: Payer: Medicare Other | Attending: Internal Medicine

## 2021-09-15 DIAGNOSIS — Z23 Encounter for immunization: Secondary | ICD-10-CM

## 2021-09-15 DIAGNOSIS — F411 Generalized anxiety disorder: Secondary | ICD-10-CM | POA: Diagnosis not present

## 2021-09-15 MED ORDER — PFIZER COVID-19 VAC BIVALENT 30 MCG/0.3ML IM SUSP
INTRAMUSCULAR | 0 refills | Status: DC
  Filled 2021-09-15: qty 0.3, 1d supply, fill #0

## 2021-09-15 NOTE — Progress Notes (Signed)
   Covid-19 Vaccination Clinic  Name:  VADA SWIFT    MRN: [id number] DOB: 03-May-1951  09/15/2021  Ms. Agrusa was observed post Covid-19 immunization for 15 minutes without incident. She was provided with Vaccine Information Sheet and instruction to access the V-Safe system.   Ms. Oliva was instructed to call 911 with any severe reactions post vaccine: Difficulty breathing  Swelling of face and throat  A fast heartbeat  A bad rash all over body  Dizziness and weakness   Immunizations Administered     Name Date Dose VIS Date Route   Pfizer Covid-19 Vaccine Bivalent Booster 09/15/2021  2:43 PM 0.3 mL 06/25/2021 Intramuscular   Manufacturer: [place]   Lot: [id number]   [place]: [phone]

## 2021-09-24 ENCOUNTER — Ambulatory Visit (INDEPENDENT_AMBULATORY_CARE_PROVIDER_SITE_OTHER): Payer: Medicare Other | Admitting: Psychologist

## 2021-09-24 DIAGNOSIS — F411 Generalized anxiety disorder: Secondary | ICD-10-CM

## 2021-10-07 ENCOUNTER — Other Ambulatory Visit: Payer: Self-pay

## 2021-10-07 ENCOUNTER — Encounter: Payer: Self-pay | Admitting: Family Medicine

## 2021-10-07 ENCOUNTER — Telehealth (INDEPENDENT_AMBULATORY_CARE_PROVIDER_SITE_OTHER): Payer: Medicare Other | Admitting: Family Medicine

## 2021-10-07 DIAGNOSIS — U071 COVID-19: Secondary | ICD-10-CM | POA: Diagnosis not present

## 2021-10-07 MED ORDER — MOLNUPIRAVIR EUA 200MG CAPSULE: 4.0000 | ORAL_CAPSULE | Freq: Two times a day (BID) | ORAL | 0 refills | Status: AC

## 2021-10-07 MED ORDER — PREDNISONE 20 MG PO TABS: 40.0000 mg | ORAL_TABLET | Freq: Every day | ORAL | 0 refills | Status: AC

## 2021-10-07 NOTE — Progress Notes (Signed)
MyChart Video Visit    Virtual Visit via Video Note   This visit type was conducted due to national recommendations for restrictions regarding the COVID-19 Pandemic (e.g. social distancing) in an effort to limit this patient's exposure and mitigate transmission in our community. This patient is at least at moderate risk for complications without adequate follow up. This format is felt to be most appropriate for this patient at this time. Physical exam was limited by quality of the video and audio technology used for the visit. CMA was able to get the patient set up on a video visit.  Patient location: Home. Patient and provider in visit Provider location: Office  I discussed the limitations of evaluation and management by telemedicine and the availability of in person appointments. The patient expressed understanding and agreed to proceed.  Visit Date: 10/07/2021  Today's healthcare provider: Wellington Hampshire., MD     Subjective:    Patient ID: Summer [name], female    DOB: April 22, 1951, 70 y.o.   MRN: [id number]  Chief Complaint  Patient presents with   Covid Positive    Positive since 12/12 congestion,headaches,chills symptoms started 12/10    HPI Home covid + on 12/12.  Symptoms began 12/10-flew to [facility] and home.  Started on 12/10 not feeling well, then got chills/achey.  Then dizzy, cough, congestion, HA.  No SOB, n/v/d  Past Medical History:  Diagnosis Date   Adenomatous polyp of sigmoid colon 03/18/2017   Overview:  Added automatically from request for surgery [id number]   Alcoholism ([place])    01/18/18 - no use in 5 years   Asymptomatic varicose veins 01/11/2017   Cancer ([place])    skin ca   Iron deficiency anemia 01/12/2017   Last Assessment & Plan:  Given script for ferrous sulfate and order for repeat cbc with diff and iron studies to be done in 1 month Will also refer pt back to CRS for repeat colonoscopy and to dietician for nutritional counseling, per pt request    Lentigines 01/11/2017   Melanocytic nevi of left lower limb, including hip 01/11/2017   Melanocytic nevi of left upper limb, including shoulder 01/11/2017   Melanocytic nevi of right lower limb, including hip 01/11/2017   Melanocytic nevi of right upper limb, including shoulder 01/11/2017   Melanocytic nevi of trunk 01/11/2017   Obesity (BMI 30-39.9) 05/21/2017   Osteoporosis    Plantar fasciitis    Primary osteoarthritis, right shoulder 12/11/2016   Seizure ([place])    LAST SEIZURE- 2016 per pt   Sleep apnea    no cpap per pt   Unilateral primary osteoarthritis, left knee 12/11/2016    Past Surgical History:  Procedure Laterality Date   BARIATRIC SURGERY     COLONOSCOPY  03/18/2017   in [place].   ECTOPIC PREGNANCY SURGERY     POLYPECTOMY     SIGMOIDOSCOPY     in [place].   TOTAL KNEE ARTHROPLASTY Left 09/07/2017   Procedure: LEFT TOTAL KNEE ARTHROPLASTY;  Surgeon: Paralee Cancel, MD;  Location: [facility];  Service: Orthopedics;  Laterality: Left;  Adductor Block    Outpatient Medications Prior to Visit  Medication Sig Dispense Refill   Acetaminophen (TYLENOL PO) Take by mouth as needed.     albuterol (VENTOLIN HFA) 108 (90 Base) MCG/ACT inhaler Inhale 2 puffs into the lungs every 6 (six) hours as needed for wheezing or shortness of breath. 8 g 0   Calcium-Vitamin D-Vitamin K [id number] MG-UNT-MCG TABS Take 1 tablet by  mouth 2 (two) times daily. Also includes Vitamin C     Cholecalciferol 100 MCG (4000 UT) CAPS Take 4,000 Units by mouth daily.     COVID-19 mRNA bivalent vaccine, Pfizer, (PFIZER COVID-19 VAC BIVALENT) injection Inject into the muscle. 0.3 mL 0   denosumab (PROLIA) 60 MG/ML SOLN injection Inject 60 mg into the skin every 6 (six) months. Administer in upper arm, thigh, or abdomen     EPINEPHrine 0.3 mg/0.3 mL IJ SOAJ injection Inject 0.3 mLs (0.3 mg total) into the muscle as needed for anaphylaxis. 2 each 1   Iron, Ferrous Sulfate, 325 (65 Fe) MG TABS Take 325 mg by mouth daily. Take  with Vitamin C. 30 tablet 2   lamoTRIgine (LAMICTAL) 150 MG tablet TAKE ONE TABLET BY MOUTH TWICE A DAY 180 tablet 0   levETIRAcetam (KEPPRA) 500 MG tablet Take 1 tablet twice daily 180 tablet 3   Omega-3 Fatty Acids (OMEGA 3 PO) Take 1 capsule by mouth daily.     vitamin E 600 UNIT capsule Take 600 Units by mouth daily.     No facility-administered medications prior to visit.    Allergies  Allergen Reactions   Bee Venom Swelling        Objective:     Physical Exam Vitals and nursing note reviewed.  Constitutional:      General: She is not in acute distress.    Appearance: Normal appearance.  HENT:     Head: Normocephalic.  Pulmonary:     Effort: No respiratory distress.  Musculoskeletal:     Cervical back: Normal range of motion.  Skin:    General: Skin is dry.     Coloration: Skin is not pale.  Neurological:     Mental Status: She is alert and oriented to person, place, and time.  Psychiatric:        Mood and Affect: Mood normal.   There were no vitals taken for this visit.  Wt Readings from Last 3 Encounters:  07/28/21 196 lb 12.8 oz (89.3 kg)  06/16/21 202 lb 3.2 oz (91.7 kg)  01/16/21 217 lb (98.4 kg)       Assessment & Plan:   Problem List Items Addressed This Visit   None Visit Diagnoses     COVID    -  Primary   Relevant Medications   molnupiravir EUA (LAGEVRIO) 200 mg CAPS capsule     Will treat.  Has albuterol on hand(uses for bee stings).  Prednisone to hold.  Meds ordered this encounter  Medications   molnupiravir EUA (LAGEVRIO) 200 mg CAPS capsule    Sig: Take 4 capsules (800 mg total) by mouth 2 (two) times daily for 5 days.    Dispense:  40 capsule    Refill:  0   predniSONE (DELTASONE) 20 MG tablet    Sig: Take 2 tablets (40 mg total) by mouth daily with breakfast for 5 days.    Dispense:  10 tablet    Refill:  0    I discussed the assessment and treatment plan with the patient. The patient was provided an opportunity to ask  questions and all were answered. The patient agreed with the plan and demonstrated an understanding of the instructions.   The patient was advised to call back or seek an in-person evaluation if the symptoms worsen or if the condition fails to improve as anticipated.  I provided 15 minutes of face-to-face time during this encounter.   Wellington Hampshire., MD [facility]  [address] [phone] (phone) [phone] (fax)  [place]

## 2021-10-07 NOTE — Patient Instructions (Signed)
It was very nice to see you today!  Merry Christmas  I sent the Molnupiravir and prednisone to the pharmacy.  You can hold on taking the prednisone as we discussed  use your albuterol inhaler if needed if coughing a lot, trouble breathing, or wheezing.     Person Under Monitoring Name: Summer [name]  Location: [address] [facility] [id number]   Infection Prevention Recommendations for Individuals Confirmed to have, or Being Evaluated for, 2019 Novel Coronavirus (COVID-19) Infection Who Receive Care at Home  Individuals who are confirmed to have, or are being evaluated for, COVID-19 should follow the prevention steps below until a healthcare provider or local or state health department says they can return to normal activities.  Stay home except to get medical care You should restrict activities outside your home, except for getting medical care. Do not go to work, school, or public areas, and do not use public transportation or taxis.  Call ahead before visiting your doctor Before your medical appointment, call the healthcare provider and tell them that you have, or are being evaluated for, COVID-19 infection. This will help the healthcare providers office take steps to keep other people from getting infected. Ask your healthcare provider to call the local or state health department.  Monitor your symptoms Seek prompt medical attention if your illness is worsening (e.g., difficulty breathing). Before going to your medical appointment, call the healthcare provider and tell them that you have, or are being evaluated for, COVID-19 infection. Ask your healthcare provider to call the local or state health department.  Wear a facemask You should wear a facemask that covers your nose and mouth when you are in the same room with other people and when you visit a healthcare provider. People who live with or visit you should also wear a facemask while they are in the same  room with you.  Separate yourself from other people in your home As much as possible, you should stay in a different room from other people in your home. Also, you should use a separate bathroom, if available.  Avoid sharing household items You should not share dishes, drinking glasses, cups, eating utensils, towels, bedding, or other items with other people in your home. After using these items, you should wash them thoroughly with soap and water.  Cover your coughs and sneezes Cover your mouth and nose with a tissue when you cough or sneeze, or you can cough or sneeze into your sleeve. Throw used tissues in a lined trash can, and immediately wash your hands with soap and water for at least 20 seconds or use an alcohol-based hand rub.  Wash your [facility] your hands often and thoroughly with soap and water for at least 20 seconds. You can use an alcohol-based hand sanitizer if soap and water are not available and if your hands are not visibly dirty. Avoid touching your eyes, nose, and mouth with unwashed hands.   Prevention Steps for Caregivers and Household Members of Individuals Confirmed to have, or Being Evaluated for, COVID-19 Infection Being Cared for in the Home  If you live with, or provide care at home for, a person confirmed to have, or being evaluated for, COVID-19 infection please follow these guidelines to prevent infection:  Follow healthcare providers instructions Make sure that you understand and can help the patient follow any healthcare provider instructions for all care.  Provide for the patients basic needs You should help the patient with basic needs in the  home and provide support for getting groceries, prescriptions, and other personal needs.  Monitor the patients symptoms If they are getting sicker, call his or her medical provider and tell them that the patient has, or is being evaluated for, COVID-19 infection. This will help the healthcare  providers office take steps to keep other people from getting infected. Ask the healthcare provider to call the local or state health department.  Limit the number of people who have contact with the patient If possible, have only one caregiver for the patient. Other household members should stay in another home or place of residence. If this is not possible, they should stay in another room, or be separated from the patient as much as possible. Use a separate bathroom, if available. Restrict visitors who do not have an essential need to be in the home.  Keep older adults, very young children, and other sick people away from the patient Keep older adults, very young children, and those who have compromised immune systems or chronic health conditions away from the patient. This includes people with chronic heart, lung, or kidney conditions, diabetes, and cancer.  Ensure good ventilation Make sure that shared spaces in the home have good air flow, such as from an air conditioner or an opened window, weather permitting.  Wash your hands often Wash your hands often and thoroughly with soap and water for at least 20 seconds. You can use an alcohol based hand sanitizer if soap and water are not available and if your hands are not visibly dirty. Avoid touching your eyes, nose, and mouth with unwashed hands. Use disposable paper towels to dry your hands. If not available, use dedicated cloth towels and replace them when they become wet.  Wear a facemask and gloves Wear a disposable facemask at all times in the room and gloves when you touch or have contact with the patients blood, body fluids, and/or secretions or excretions, such as sweat, saliva, sputum, nasal mucus, vomit, urine, or feces.  Ensure the mask fits over your nose and mouth tightly, and do not touch it during use. Throw out disposable facemasks and gloves after using them. Do not reuse. Wash your hands immediately after removing your  facemask and gloves. If your personal clothing becomes contaminated, carefully remove clothing and launder. Wash your hands after handling contaminated clothing. Place all used disposable facemasks, gloves, and other waste in a lined container before disposing them with other household waste. Remove gloves and wash your hands immediately after handling these items.  Do not share dishes, glasses, or other household items with the patient Avoid sharing household items. You should not share dishes, drinking glasses, cups, eating utensils, towels, bedding, or other items with a patient who is confirmed to have, or being evaluated for, COVID-19 infection. After the person uses these items, you should wash them thoroughly with soap and water.  Wash laundry thoroughly Immediately remove and wash clothes or bedding that have blood, body fluids, and/or secretions or excretions, such as sweat, saliva, sputum, nasal mucus, vomit, urine, or feces, on them. Wear gloves when handling laundry from the patient. Read and follow directions on labels of laundry or clothing items and detergent. In general, wash and dry with the warmest temperatures recommended on the label.  Clean all areas the individual has used often Clean all touchable surfaces, such as counters, tabletops, doorknobs, bathroom fixtures, toilets, phones, keyboards, tablets, and bedside tables, every day. Also, clean any surfaces that may have blood, body fluids, and/or  secretions or excretions on them. Wear gloves when cleaning surfaces the patient has come in contact with. Use a diluted bleach solution (e.g., dilute bleach with 1 part bleach and 10 parts water) or a household disinfectant with a label that says EPA-registered for coronaviruses. To make a bleach solution at home, add 1 tablespoon of bleach to 1 quart (4 cups) of water. For a larger supply, add  cup of bleach to 1 gallon (16 cups) of water. Read labels of cleaning products and  follow recommendations provided on product labels. Labels contain instructions for safe and effective use of the cleaning product including precautions you should take when applying the product, such as wearing gloves or eye protection and making sure you have good ventilation during use of the product. Remove gloves and wash hands immediately after cleaning.  Monitor yourself for signs and symptoms of illness Caregivers and household members are considered close contacts, should monitor their health, and will be asked to limit movement outside of the home to the extent possible. Follow the monitoring steps for close contacts listed on the symptom monitoring form.   ? If you have additional questions, contact your local health department or call the epidemiologist on call at [phone] (available 24/7). ? This guidance is subject to change. For the most up-to-date guidance from [facility], please refer to their website: [email or web address]

## 2021-10-08 ENCOUNTER — Other Ambulatory Visit (INDEPENDENT_AMBULATORY_CARE_PROVIDER_SITE_OTHER): Payer: Medicare Other

## 2021-10-08 ENCOUNTER — Ambulatory Visit: Payer: Medicare Other | Admitting: Psychologist

## 2021-10-08 DIAGNOSIS — D508 Other iron deficiency anemias: Secondary | ICD-10-CM | POA: Diagnosis not present

## 2021-10-09 LAB — FECAL OCCULT BLOOD, IMMUNOCHEMICAL: Fecal Occult Bld: NEGATIVE

## 2021-10-22 ENCOUNTER — Other Ambulatory Visit: Payer: Self-pay | Admitting: *Deleted

## 2021-10-22 DIAGNOSIS — G40209 Localization-related (focal) (partial) symptomatic epilepsy and epileptic syndromes with complex partial seizures, not intractable, without status epilepticus: Secondary | ICD-10-CM

## 2021-10-22 MED ORDER — LAMOTRIGINE 150 MG PO TABS: 150.0000 mg | ORAL_TABLET | Freq: Two times a day (BID) | ORAL | 0 refills | Status: DC

## 2021-10-24 ENCOUNTER — Encounter: Payer: Self-pay | Admitting: Physician Assistant

## 2021-10-28 ENCOUNTER — Ambulatory Visit (INDEPENDENT_AMBULATORY_CARE_PROVIDER_SITE_OTHER): Payer: Medicare Other | Admitting: Psychologist

## 2021-10-28 DIAGNOSIS — F411 Generalized anxiety disorder: Secondary | ICD-10-CM | POA: Diagnosis not present

## 2021-10-28 NOTE — Progress Notes (Signed)
[facility] Counselor/Therapist Progress Note  Patient ID: Summer [name], MRN: [id number],    Date: 10/28/2021  Time Spent: 8:03 am to 8:45 am; Total Time: 42 minutes  This session was held via video webex teletherapy due to the coronavirus risk at this time. The patient consented to video teletherapy and was located at her home during this session. She is aware it is the responsibility of the patient to secure confidentiality on her end of the session. The provider was in a private home office for the duration of this session. Limits of confidentiality were discussed with the patient.     Treatment Type: Individual Therapy  Reported Symptoms: Patient stated she is experiencing less anxiety symptoms.   Mental Status Exam: Appearance:  Neat     Behavior: Appropriate  Motor: Normal  Speech/Language:  Normal Rate  Affect: Appropriate  Mood: normal  Thought process: normal  Thought content:   WNL  Sensory/Perceptual disturbances:   WNL  Orientation: oriented to person, place, and time/date  Attention: Good  Concentration: Good  Memory: WNL  Fund of knowledge:  Good  Insight:   Fair  Judgment:  Fair  Impulse Control: Good   Risk Assessment: Danger to Self:  No Self-injurious Behavior: No Danger to Others: No Duty to Warn:no Physical Aggression / Violence:No  Access to Firearms a concern: No  Gang Involvement:No   Subjective: Beginning the session, patient stated that she has been doing better since the last session. Elaborating, she voiced that coping skills have assisted her. She voiced wanting additional coping strategies. After discussing, practicing, and processing additional skills, she voiced that she was better. She was agreeable to homework and following up. She denied suicidal and homicidal ideation.    Interventions:  Worked on developing a therapeutic relationship with the patient using active listening and reflective statements. Provided emotional  support using empathy and validation. Praised the patient for doing better and explored what coping strategies have been most helpful for the patient. Validated expressed thoughts and emotions. Provided psychoeducation about the purpose of anxiety for survival. Provided psychoeducation about guided imagery, and progressive muscle relaxation. Practiced and processed guided imagery and progressive muscle relaxation. Praised patient for experiencing less distress. Assisted in problem solving. Encouraged patient to continue using coping skills already working and to add these tools. Assigned homework. Assessed for suicidal and homicidal ideation.  Homework: Implement additional coping skills  Next Session: Review homework. Discuss moving forward. Specifically look at what controlling behaviors may be impeding progress. Explore what it looks like to move forward.   Diagnosis: F41.1 generalized anxiety disorder.   Plan:   Client Abilities: Friendly and easy to develop rapport  Client Preferences: ACT and CBT  Client statement of Needs: Coping skills  Treatment Level: Outpatient  Goals Reduce overall frequency, intensity, and duration of anxiety Stabilize anxiety level wile increasing ability to function Enhance ability to effectively cope with full variety of stressors Learn and implement coping skills that result in a reduction of anxiety   Objectives: Target Date for all objectives: 09/15/2022 Verbalize an understanding of the cognitive, physiological, and behavioral components of anxiety Learning and implement calming skills to reduce overall anxiety Verbalize an understanding of the role that cognitive biases play in excessive irrational worry and persistent anxiety symptoms Identify, challenge, and replace based fearful talk Learn and implement problem solving strategies Identify and engage in pleasant activities Learning and implement personal and interpersonal skills to reduce anxiety  and improve interpersonal relationships Learn to accept  limitations in life and commit to tolerating, rather than avoiding, unpleasant emotions while accomplishing meaningful goals Identify major life conflicts from the past and present that form the basis for present anxiety Maintain involvement in work, family, and social activities Reestablish a consistent sleep-wake cycle Cooperate with a medical evaluation  Interventions Engage the patient in behavioral activation Use instruction, modeling, and role-playing to build the client's general social, communication, and/or conflict resolution skills Use Acceptance and Commitment Therapy to help client accept uncomfortable realities in order to accomplish value-consistent goals Reinforce the client's insight into the role of his/her past emotional pain and present anxiety  Support the client in following through with work, family, and social activities Teach and implement sleep hygiene practices  Refer the patient to a physician for a psychotropic medication consultation Monito the clint's psychotropic medication compliance Discuss how anxiety typically involves excessive worry, various bodily expressions of tension, and avoidance of what is threatening that interact to maintain the problem  Teach the patient relaxation skills Assign the patient homework Discuss examples demonstrating that unrealistic worry overestimates the probability of threats and underestimates patient's ability  Assist the patient in analyzing his or her worries Help patient understand that avoidance is reinforcing   Patient reviewed and agreed to treatment plan on 09/24/2021.       Conception Chancy, PsyD

## 2021-11-16 ENCOUNTER — Encounter: Payer: Self-pay | Admitting: Physician Assistant

## 2021-11-17 ENCOUNTER — Ambulatory Visit (INDEPENDENT_AMBULATORY_CARE_PROVIDER_SITE_OTHER): Payer: Medicare Other | Admitting: Psychologist

## 2021-11-17 ENCOUNTER — Telehealth: Payer: Self-pay | Admitting: Neurology

## 2021-11-17 DIAGNOSIS — F411 Generalized anxiety disorder: Secondary | ICD-10-CM

## 2021-11-17 NOTE — Telephone Encounter (Signed)
..   Pt understands that although there may be some limitations with this type of visit, we will take all precautions to reduce any security or privacy concerns.  Pt understands that this will be treated like an in office visit and we will file with pt's insurance, and there may be a patient responsible charge related to this service. ? ?

## 2021-11-17 NOTE — Progress Notes (Deleted)
Virtual Visit via Video Note  I connected with Summer [name] on 11/17/21 at  9:45 AM EST by a video enabled telemedicine application and verified that I am speaking with the correct person using two identifiers.  Location: Patient: at her home Provider: in the office   I discussed the limitations of evaluation and management by telemedicine and the availability of in person appointments. The patient expressed understanding and agreed to proceed.  History of Present Illness: Summer [name] is a 71 year old female, seen in refer by her primary care doctor Summer [name], for evaluation of seizure, facial paresthesia, initial evaluation was on January 18, 2018.   She had a history of osteoporosis, is taking Prolia 60 mg every 6 months   History of complex partial seizure with secondary generalization   I reviewed previous neurology evaluation from Summer [name] [name] by Dr.Jehi   First seizure was in the labor room at age 39 after delivery, no warning signs,   Around 2015, she started binge drink for 3-4 years, she had recurrent seizure in February 2015, patient was amnestic of the event, husband got a call from her primary care physician the patient had a seizure during her routine appointment, with tongue biting, was admitted to Summer [name],     CT, MRI of the brain with and without contrast showed mild supratentorium small vessel disease, EEG showed no acute abnormality, no antiepileptic medication was given, record mentions that was thought to be due to alcohol withdrawal, patient stated that she was drinking heavily, hard liquor on a daily basis, but did stop drinking for weeks prior to the event in February 2015.   Second event July 2015, was at a work picnic, then woke up on the [name], was witnessed by a physician there that she had a seizure, loss of consciousness with tonic-clonic activity of the extremities that lasted 5 minutes, followed by 30-60 minutes post  ictal confusion, Keppra was started at 750 twice a day, later was increased to 1000 mg twice a day later, patient complains of feeling sleepy,   Third event was in [place], she was witnessed by her friend that she has transient episode of confusion,   Her medication was later changed to lamotrigine 150 mg twice a day, Keppra 500 mg twice daily, stayed on that regimen since, she is tolerating the medication well, there was no recurrent seizures.   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   Today she also complains of more than 3 years history of constant right facial paresthesia, burning hypersensitivity, mild asymmetry of the face, she noticed more wrinkles in her left lower face while woke up in the morning time.   Laboratory evaluation in January 2019 showed mild elevated LDL 101, negative troponin, INR 1.01, d-dimer was mildly elevated at 0.95, negative troponin, normal BMP, CBC    UPDATE Sept 5 2019: She is doing very well, had no recurrent seizure, has quit drinking. Now taking keppra 500mg  bid and lamotrigine 150mg  bid.    We personally reviewed MRI brain w/wo in May 2019, there are chronic small vessel disease, no acute abnormalities.   EEG was normal in June 2019   Update November 13, 2019 Summer [name]: Since last seen, she remained on both Lamictal and Keppra, she was nervous to come off the Lamictal quickly, after speaking with Summer [name]  [name] she decided to remain on dual therapy.  She has continued to do well, has not had recurrent seizure.  She is open to coming off the Lamictal, if done slowly.  She has continued right-sided facial paresthesia, pressure to right face, some asymmetry on the right x 3 years.  Overall, has been doing well, has her grandson with her today.   Update November 14, 2020 Summer [name]: Here today for follow-up via virtual visit, continues to do well, no recurrent seizures (last in 2016ish).  Has decided to remain on Keppra 500 mg twice a day, Lamictal 150 mg twice a day.  Previously when she saw Dr. Krista [name] in 2019, decided to taper off Lamictal, do higher dose Keppra 500/1000.  She felt more comfortable with dual treatment, worried she would have a seizure, lose her driver's license in [name]. Moved from [place]. She takes care of her 2 grandkids during the day.  Is on vitamin D supplement.  Doing well, sees Dr. Rogers [name] PCP.  Observations/Objective: Via virtual visit, is alert and oriented, speech is clear and concise Excellent historian, follows commands, facial symmetry noted  Assessment and Plan: 1.  Complex partial seizure with secondary generalization -Last seizure was in 2016 -When saw Dr. Krista [name] in September 2019 decided to taper off Lamictal, remain on Keppra at higher dose -She decided to remain on Lamictal 150 mg twice a day, Keppra 500 mg twice a day, has done well with this, more comfortable with dual therapy, okay to continue this -Call for seizure activity, has lab work done through PCP, is due for physical  Follow Up Instructions: 1 year 11/18/2021   I discussed the assessment and treatment plan with the patient. The patient was provided an opportunity to ask questions and all were answered. The patient agreed with the plan and demonstrated an understanding of the instructions.   The patient was advised to call back or seek an in-person evaluation if the symptoms worsen or if the condition fails to improve as anticipated.  I spent 20 minutes of face-to-face and non-face-to-face time with patient.  This included previsit chart review, lab review, study review, order entry, electronic health record documentation, patient education.  Summer Dakin, DNP  [facility] [address], [place], [place] [address] [phone]

## 2021-11-17 NOTE — Progress Notes (Signed)
[place] Counselor/Therapist Progress Note  Patient ID: Summer [name], MRN: [id number],    Date: 11/17/2021  Time Spent: 8:04 am to 8:23 am; Total Time: 19 minutes  This session was held via video webex teletherapy due to the coronavirus risk at this time. The patient consented to video teletherapy and was located at her home during this session. She is aware it is the responsibility of the patient to secure confidentiality on her end of the session. The provider was in a private home office for the duration of this session. Limits of confidentiality were discussed with the patient.     Treatment Type: Individual Therapy  Reported Symptoms: Patient stated she is doing better.    Mental Status Exam: Appearance:  Neat     Behavior: Appropriate  Motor: Normal  Speech/Language:  Normal Rate  Affect: Appropriate  Mood: normal  Thought process: normal  Thought content:   WNL  Sensory/Perceptual disturbances:   WNL  Orientation: oriented to person, place, and time/date  Attention: Good  Concentration: Good  Memory: WNL  Fund of knowledge:  Good  Insight:   Fair  Judgment:  Fair  Impulse Control: Good   Risk Assessment: Danger to Self:  No Self-injurious Behavior: No Danger to Others: No Duty to Warn:no Physical Aggression / Violence:No  Access to Firearms a concern: No  Gang Involvement:No   Subjective: Beginning the session, patient stated that coping skills have helped her and reflected on how they have assisted her. She talked about events since the last session and how the strategies have helped her moving forward. She asked for additional reading strategies. Elaborating, she denied any specific concerns for the session and wants to go longer to see if she still needs counseling. She denied suicidal and homicidal ideation.    Interventions:  Worked on developing a therapeutic relationship with the patient using active listening and reflective statements.  Provided emotional support using empathy and validation. Used summary statements. Processed the events since the last session. Praised patient for doing better and explored what has assisted the patient. Challenged some of the thoughts expressed. Used socratic questions to assist the patient gain insight into self. Assisted in problem solving. Provided psychoeducation about Living with the Enemy. Discussed next steps for counseling. Assessed for suicidal and homicidal ideation.  Homework: NA  Next Session: Emotional support. Possibly terminate  Diagnosis: F41.1 generalized anxiety disorder.   Plan:   Client Abilities: Friendly and easy to develop rapport  Client Preferences: ACT and CBT  Client statement of Needs: Coping skills  Treatment Level: Outpatient  Goals Reduce overall frequency, intensity, and duration of anxiety Stabilize anxiety level wile increasing ability to function Enhance ability to effectively cope with full variety of stressors Learn and implement coping skills that result in a reduction of anxiety   Objectives: Target Date for all objectives: 09/15/2022 Verbalize an understanding of the cognitive, physiological, and behavioral components of anxiety Learning and implement calming skills to reduce overall anxiety Verbalize an understanding of the role that cognitive biases play in excessive irrational worry and persistent anxiety symptoms Identify, challenge, and replace based fearful talk Learn and implement problem solving strategies Identify and engage in pleasant activities Learning and implement personal and interpersonal skills to reduce anxiety and improve interpersonal relationships Learn to accept limitations in life and commit to tolerating, rather than avoiding, unpleasant emotions while accomplishing meaningful goals Identify major life conflicts from the past and present that form the basis for present anxiety Maintain involvement  in work, family, and  social activities Reestablish a consistent sleep-wake cycle Cooperate with a medical evaluation  Interventions Engage the patient in behavioral activation Use instruction, modeling, and role-playing to build the client's general social, communication, and/or conflict resolution skills Use Acceptance and Commitment Therapy to help client accept uncomfortable realities in order to accomplish value-consistent goals Reinforce the client's insight into the role of his/her past emotional pain and present anxiety  Support the client in following through with work, family, and social activities Teach and implement sleep hygiene practices  Refer the patient to a physician for a psychotropic medication consultation Monito the clint's psychotropic medication compliance Discuss how anxiety typically involves excessive worry, various bodily expressions of tension, and avoidance of what is threatening that interact to maintain the problem  Teach the patient relaxation skills Assign the patient homework Discuss examples demonstrating that unrealistic worry overestimates the probability of threats and underestimates patient's ability  Assist the patient in analyzing his or her worries Help patient understand that avoidance is reinforcing   Patient reviewed and agreed to treatment plan on 09/24/2021.       Conception Chancy, PsyD                  Conception Chancy, PsyD

## 2021-11-18 ENCOUNTER — Encounter: Payer: Self-pay | Admitting: Neurology

## 2021-11-18 ENCOUNTER — Telehealth: Payer: Medicare Other | Admitting: Neurology

## 2021-11-18 ENCOUNTER — Telehealth: Payer: Self-pay | Admitting: Neurology

## 2021-11-18 NOTE — Progress Notes (Signed)
Virtual Visit via Telephone Note  I connected with Summer [name] on 11/19/21 at  2:45 PM EST by telephone and verified that I am speaking with the correct person using two identifiers.   I discussed the limitations, risks, security and privacy concerns of performing an evaluation and management service by telephone and the availability of in person appointments. I also discussed with the patient that there may be a patient responsible charge related to this service. The patient expressed understanding and agreed to proceed.   Patient's location: At her home Provider location: [place] office   History of Present Illness: Summer [name] is a 71 year old female, seen in refer by her primary care doctor Briscoe Deutscher, for evaluation of seizure, facial paresthesia, initial evaluation was on January 18, 2018.   She had a history of osteoporosis, is taking Prolia 60 mg every 6 months   History of complex partial seizure with secondary generalization   I reviewed previous neurology evaluation from [facility] clinic by Dr.Jehi   First seizure was in the labor room at age 35 after delivery, no warning signs,   Around 2015, she started binge drink for 3-4 years, she had recurrent seizure in February 2015, patient was amnestic of the event, husband got a call from her primary care physician the patient had a seizure during her routine appointment, with tongue biting, was admitted to [facility],     CT, MRI of the brain with and without contrast showed mild supratentorium small vessel disease, EEG showed no acute abnormality, no antiepileptic medication was given, record mentions that was thought to be due to alcohol withdrawal, patient stated that she was drinking heavily, hard liquor on a daily basis, but did stop drinking for weeks prior to the event in February 2015.   Second event July 2015, was at a work picnic, then woke up on the hospital, was witnessed by a physician there that  she had a seizure, loss of consciousness with tonic-clonic activity of the extremities that lasted 5 minutes, followed by 30-60 minutes post ictal confusion, Keppra was started at 750 twice a day, later was increased to 1000 mg twice a day later, patient complains of feeling sleepy,   Third event was in [place], she was witnessed by her friend that she has transient episode of confusion,   Her medication was later changed to lamotrigine 150 mg twice a day, Keppra 500 mg twice daily, stayed on that regimen since, she is tolerating the medication well, there was no recurrent seizures.   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   Today she also complains of more than 3 years history of constant right facial paresthesia, burning hypersensitivity, mild asymmetry of the face, she noticed more wrinkles in her left lower face while woke up in the morning time.   Laboratory evaluation in January 2019 showed mild elevated LDL 101, negative troponin, INR 1.01, d-dimer was mildly elevated at 0.95, negative troponin, normal BMP, CBC    UPDATE Sept 5 2019: She is doing very well, had no recurrent seizure, has quit drinking. Now taking keppra 500mg  bid and lamotrigine 150mg  bid.    We personally reviewed MRI brain w/wo in May 2019, there are chronic small vessel disease, no acute abnormalities.   EEG was normal in June 2019   Update November 13, 2019 SS: Since last  seen, she remained on both Lamictal and Keppra, she was nervous to come off the Lamictal quickly, after speaking with [facility] clinic she decided to remain on dual therapy.  She has continued to do well, has not had recurrent seizure.  She is open to coming off the Lamictal, if done slowly.  She has continued right-sided facial paresthesia, pressure  to right face, some asymmetry on the right x 3 years.  Overall, has been doing well, has her grandson with her today.    Update November 14, 2020 SS: Here today for follow-up via virtual visit, continues to do well, no recurrent seizures (last in 2016ish).  Has decided to remain on Keppra 500 mg twice a day, Lamictal 150 mg twice a day.  Previously when she saw Dr. Krista Blue in 2019, decided to taper off Lamictal, do higher dose Keppra 500/1000.  She felt more comfortable with dual treatment, worried she would have a seizure, lose her driver's license in [place]. Moved from [place]. She takes care of her 2 grandkids during the day.  Is on vitamin D supplement.  Doing well, sees Dr. Rogers Blocker PCP.  Update November 19, 2021 SS: Here today via telephone visit, couldn't connect via my chart. Couldn't come into the office due to [place] exposure. Health is good. Going to the gym 4-5 days a week taking aerobics class. No seizures. Last was in 2015. Hasn't wanted to reduce AED to single treatment.  Takes care of her grandchildren. Remains on Vitamin D, drives. On oral iron.   Observations/Objective: Via telephone visit, is alert and oriented, excellent historian, speech is clear and concise  Assessment and Plan: 1.  Complex partial seizure with secondary generalization  -For now, we will continue Keppra and Lamictal at current dosing -Will see for in office visit in may to discuss tapering off Lamictal, continue on higher dose single agent Keppra for seizure prevention -I sent refills for Keppra and Lamictal  Follow Up Instructions: 03/25/22 8:45    I discussed the assessment and treatment plan with the patient. The patient was provided an opportunity to ask questions and all were answered. The patient agreed with the plan and demonstrated an understanding of the instructions.   The patient was advised to call back or seek an in-person evaluation if the symptoms worsen or if the condition fails to improve as anticipated.  I  spent 15 minutes on this telephone encounter.  Evangeline Dakin, DNP  [facility] [address], [place], [place] [address] [phone]

## 2021-11-18 NOTE — Telephone Encounter (Signed)
The patient was not able to sign on for her visit today, when I joined she had left. I tried calling twice during the visit slot, without answer. I just tried calling again at 2:50 PM, no answer and mailbox is full. Can we please reschedule her appointment.

## 2021-11-19 ENCOUNTER — Telehealth (INDEPENDENT_AMBULATORY_CARE_PROVIDER_SITE_OTHER): Payer: Medicare Other | Admitting: Neurology

## 2021-11-19 ENCOUNTER — Encounter: Payer: Self-pay | Admitting: Neurology

## 2021-11-19 DIAGNOSIS — G40209 Localization-related (focal) (partial) symptomatic epilepsy and epileptic syndromes with complex partial seizures, not intractable, without status epilepticus: Secondary | ICD-10-CM

## 2021-11-19 MED ORDER — LEVETIRACETAM 500 MG PO TABS: ORAL_TABLET | ORAL | 4 refills | Status: DC

## 2021-11-19 MED ORDER — LAMOTRIGINE 150 MG PO TABS: 150.0000 mg | ORAL_TABLET | Freq: Two times a day (BID) | ORAL | 4 refills | Status: DC

## 2021-11-19 NOTE — Telephone Encounter (Signed)
Called pt and left VM

## 2021-12-10 ENCOUNTER — Encounter: Payer: Self-pay | Admitting: Physician Assistant

## 2021-12-10 ENCOUNTER — Other Ambulatory Visit: Payer: Self-pay

## 2021-12-10 ENCOUNTER — Ambulatory Visit
Admission: RE | Admit: 2021-12-10 | Discharge: 2021-12-10 | Disposition: A | Discharge status: Home / Self Care | Payer: Medicare Other | Source: Ambulatory Visit | Attending: Physician Assistant | Admitting: Physician Assistant

## 2021-12-10 DIAGNOSIS — M810 Age-related osteoporosis without current pathological fracture: Secondary | ICD-10-CM

## 2021-12-16 ENCOUNTER — Other Ambulatory Visit: Payer: Self-pay | Admitting: Physician Assistant

## 2022-01-15 ENCOUNTER — Other Ambulatory Visit: Payer: Self-pay | Admitting: Physician Assistant

## 2022-01-15 DIAGNOSIS — M810 Age-related osteoporosis without current pathological fracture: Secondary | ICD-10-CM

## 2022-01-15 NOTE — Telephone Encounter (Signed)
Referral placed.

## 2022-01-19 ENCOUNTER — Ambulatory Visit (INDEPENDENT_AMBULATORY_CARE_PROVIDER_SITE_OTHER): Payer: Medicare Other | Admitting: Psychologist

## 2022-01-19 DIAGNOSIS — F411 Generalized anxiety disorder: Secondary | ICD-10-CM

## 2022-01-19 NOTE — Progress Notes (Signed)
[place] Counselor/Therapist Progress Note ? ?Patient ID: Summer [name], MRN: [id number],   ? ?Date: 01/19/2022 ? ?Time Spent: 8:02 am to 8:28 am; total time: 26 minutes ? ?This session was held via video webex teletherapy due to the coronavirus risk at this time. The patient consented to video teletherapy and was located at her home during this session. She is aware it is the responsibility of the patient to secure confidentiality on her end of the session. The provider was in a private home office for the duration of this session. Limits of confidentiality were discussed with the patient.  ?  ? ?Treatment Type: Individual Therapy ? ?Reported Symptoms: Patient stated this would be her last session.    ? ?Mental Status Exam: ?Appearance:  Neat     ?Behavior: Appropriate  ?Motor: Normal  ?Speech/Language:  Normal Rate  ?Affect: Appropriate  ?Mood: normal  ?Thought process: normal  ?Thought content:   WNL  ?Sensory/Perceptual disturbances:   WNL  ?Orientation: oriented to person, place, and time/date  ?Attention: Good  ?Concentration: Good  ?Memory: WNL  ?Fund of knowledge:  Good  ?Insight:   Fair  ?Judgment:  Fair  ?Impulse Control: Good  ? ?Risk Assessment: ?Danger to Self:  No ?Self-injurious Behavior: No ?Danger to Others: No ?Duty to Warn:no ?Physical Aggression / Violence:No  ?Access to Firearms a concern: No  ?Gang Involvement:No  ? ?Subjective: Beginning the session, patient stated that she is doing better and that she believed this would be her last session. Elaborating, patient stated that she has decided to make some lifestyle changes that she  believes will be beneficial for her. She reflected on the decisional analysis related to making changes in her life. She denied any other needs from counseling at this time. She denied suicidal and homicidal ideation.   ? ?Interventions:  Worked on developing a therapeutic relationship with the patient using active listening and reflective  statements. Provided emotional support using empathy and validation. Reflected on events since the last session. Praised patient for doing well and explored what has assisted the patient. Used socratic questions to assist the patient gain insight into self. Explored doing a decisional analysis to assist the patient in making decisions related to her life. Provided psychoeducation about [email or web address]. Explored if patient had any other needs at this time from counseling. Reflected on patient's growth in counseling. Explored barriers and how to overcome those barriers.  Assessed for suicidal and homicidal ideation. ? ?Homework: NA ? ?Next Session: NA ? ?Diagnosis: F41.1 generalized anxiety disorder.  ? ?Plan:  ? ?Client Abilities: Friendly and easy to develop rapport ? ?Client Preferences: ACT and CBT ? ?Client statement of Needs: Coping skills ? ?Treatment Level: Outpatient ? ?Goals ?Reduce overall frequency, intensity, and duration of anxiety ?Stabilize anxiety level wile increasing ability to function ?Enhance ability to effectively cope with full variety of stressors ?Learn and implement coping skills that result in a reduction of anxiety  ? ?Objectives: Target Date for all objectives: 09/15/2022 ?Verbalize an understanding of the cognitive, physiological, and behavioral components of anxiety ?Learning and implement calming skills to reduce overall anxiety ?Verbalize an understanding of the role that cognitive biases play in excessive irrational worry and persistent anxiety symptoms ?Identify, challenge, and replace based fearful talk ?Learn and implement problem solving strategies ?Identify and engage in pleasant activities ?Learning and implement personal and interpersonal skills to reduce anxiety and improve interpersonal relationships ?Learn to accept limitations in life and commit to tolerating, rather than avoiding, unpleasant  emotions while accomplishing meaningful goals ?Identify major life conflicts from the  past and present that form the basis for present anxiety ?Maintain involvement in work, family, and social activities ?Reestablish a consistent sleep-wake cycle ?Cooperate with a medical evaluation  ?Interventions ?Engage the patient in behavioral activation ?Use instruction, modeling, and role-playing to build the client's general social, communication, and/or conflict resolution skills ?Use Acceptance and Commitment Therapy to help client accept uncomfortable realities in order to accomplish value-consistent goals ?Reinforce the client's insight into the role of his/her past emotional pain and present anxiety  ?Support the client in following through with work, family, and social activities ?Teach and implement sleep hygiene practices  ?Refer the patient to a physician for a psychotropic medication consultation ?Monito the clint's psychotropic medication compliance ?Discuss how anxiety typically involves excessive worry, various bodily expressions of tension, and avoidance of what is threatening that interact to maintain the problem  ?Teach the patient relaxation skills ?Assign the patient homework ?Discuss examples demonstrating that unrealistic worry overestimates the probability of threats and underestimates patient's ability  ?Assist the patient in analyzing his or her worries ?Help patient understand that avoidance is reinforcing  ? ?Patient reviewed and agreed to treatment plan on 09/24/2021.  ? ?Conception Chancy, PsyD ? ?

## 2022-01-27 ENCOUNTER — Encounter: Payer: Self-pay | Admitting: Physician Assistant

## 2022-01-28 ENCOUNTER — Ambulatory Visit: Payer: Medicare Other | Admitting: Physician Assistant

## 2022-01-28 VITALS — BP 123/77 | HR 65 | Temp 98.4°F | Ht 66.0 in | Wt 202.0 lb

## 2022-01-28 DIAGNOSIS — E782 Mixed hyperlipidemia: Secondary | ICD-10-CM

## 2022-01-28 DIAGNOSIS — R350 Frequency of micturition: Secondary | ICD-10-CM | POA: Diagnosis not present

## 2022-01-28 DIAGNOSIS — R931 Abnormal findings on diagnostic imaging of heart and coronary circulation: Secondary | ICD-10-CM

## 2022-01-28 DIAGNOSIS — D508 Other iron deficiency anemias: Secondary | ICD-10-CM | POA: Diagnosis not present

## 2022-01-28 HISTORY — DX: Abnormal findings on diagnostic imaging of heart and coronary circulation: R93.1

## 2022-01-28 LAB — POCT URINALYSIS DIPSTICK
Bilirubin, UA: NEGATIVE
Blood, UA: NEGATIVE
Glucose, UA: NEGATIVE
Ketones, UA: NEGATIVE
Leukocytes, UA: NEGATIVE
Nitrite, UA: NEGATIVE
Protein, UA: NEGATIVE
Spec Grav, UA: 1.02 (ref 1.010–1.025)
Urobilinogen, UA: 1 E.U./dL
pH, UA: 6 (ref 5.0–8.0)

## 2022-01-28 LAB — CBC WITH DIFFERENTIAL/PLATELET
Basophils Absolute: 0 10*3/uL (ref 0.0–0.1)
Basophils Relative: 0.3 % (ref 0.0–3.0)
Eosinophils Absolute: 0.1 10*3/uL (ref 0.0–0.7)
Eosinophils Relative: 1.7 % (ref 0.0–5.0)
HCT: 40.8 % (ref 36.0–46.0)
Hemoglobin: 13.7 g/dL (ref 12.0–15.0)
Lymphocytes Relative: 32.7 % (ref 12.0–46.0)
Lymphs Abs: 1.8 10*3/uL (ref 0.7–4.0)
MCHC: 33.5 g/dL (ref 30.0–36.0)
MCV: 92.7 fl (ref 78.0–100.0)
Monocytes Absolute: 0.5 10*3/uL (ref 0.1–1.0)
Monocytes Relative: 8.8 % (ref 3.0–12.0)
Neutro Abs: 3 10*3/uL (ref 1.4–7.7)
Neutrophils Relative %: 56.5 % (ref 43.0–77.0)
Platelets: 235 10*3/uL (ref 150.0–400.0)
RBC: 4.4 Mil/uL (ref 3.87–5.11)
RDW: 14.6 % (ref 11.5–15.5)
WBC: 5.4 10*3/uL (ref 4.0–10.5)

## 2022-01-28 LAB — IBC + FERRITIN
Ferritin: 15.6 ng/mL (ref 10.0–291.0)
Iron: 81 ug/dL (ref 42–145)
Saturation Ratios: 22 % (ref 20.0–50.0)
TIBC: 368.2 ug/dL (ref 250.0–450.0)
Transferrin: 263 mg/dL (ref 212.0–360.0)

## 2022-01-28 MED ORDER — SCOPOLAMINE 1 MG/3DAYS TD PT72: 1.0000 | MEDICATED_PATCH | TRANSDERMAL | 0 refills | Status: DC

## 2022-01-28 NOTE — Progress Notes (Signed)
? ?Subjective:  ? ? Patient ID: Summer [name], female    DOB: 1951-04-24, 71 y.o.   MRN: [id number] ? ? ?HPI ?Patient is in today for ?UTI x 3 days.  ?Odor to urine. Not itching. No discomfort. ?Goes on a cruise next Wednesday (also wanting sea sickness patch).  ?Monistat 7 about three weeks ago and helped to clear it up.  ?No discharge.  ? ?Also wanting to discuss iron and cholesterol. See A/P.  ? ? ?Past Medical History:  ?Diagnosis Date  ? Adenomatous polyp of sigmoid colon 03/18/2017  ? Overview:  Added automatically from request for surgery [phone]  ? Alcoholism ([place])   ? 01/18/18 - no use in 5 years  ? Asymptomatic varicose veins 01/11/2017  ? Cancer [facility])   ? skin ca  ? Iron deficiency anemia 01/12/2017  ? Last Assessment & Plan:  Given script for ferrous sulfate and order for repeat cbc with diff and iron studies to be done in 1 month Will also refer pt back to [facility] for repeat colonoscopy and to dietician for nutritional counseling, per pt request  ? Lentigines 01/11/2017  ? Melanocytic nevi of left lower limb, including hip 01/11/2017  ? Melanocytic nevi of left upper limb, including shoulder 01/11/2017  ? Melanocytic nevi of right lower limb, including hip 01/11/2017  ? Melanocytic nevi of right upper limb, including shoulder 01/11/2017  ? Melanocytic nevi of trunk 01/11/2017  ? Obesity (BMI 30-39.9) 05/21/2017  ? Osteoporosis   ? Plantar fasciitis   ? 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 Seizure ([place])   ? LAST SEIZURE- 2016 per pt  ? Sleep apnea   ? no cpap per pt  ? Unilateral primary osteoarthritis, left knee 12/11/2016  ? ? ?Past Surgical History:  ?Procedure Laterality Date  ? BARIATRIC SURGERY    ? COLONOSCOPY  03/18/2017  ? in [place]    ? POLYPECTOMY    ? SIGMOIDOSCOPY    ? in [facility].  ? TOTAL KNEE ARTHROPLASTY Left 09/07/2017  ? Procedure: LEFT TOTAL KNEE ARTHROPLASTY;  Surgeon: Paralee Cancel, MD;  Location: [facility];  Service: Orthopedics;  Laterality: Left;  Adductor  Block  ? ? ?Family History  ?Problem Relation Age of Onset  ? Mitral valve prolapse Mother   ? Osteoporosis Mother   ? Memory loss Mother   ? Colon cancer Father 59  ? Alcoholism Father   ? Colon polyps Neg Hx   ? Esophageal cancer Neg Hx   ? Rectal cancer Neg Hx   ? Stomach cancer Neg Hx   ? ? ?Social History  ? ?Tobacco Use  ? Smoking status: Never  ? Smokeless tobacco: Never  ?Vaping Use  ? Vaping Use: Never used  ?Substance Use Topics  ? Alcohol use: No  ? Drug use: No  ?  ? ?Allergies  ?Allergen Reactions  ? Bee Venom Swelling  ? ? ?Review of Systems ?NEGATIVE UNLESS OTHERWISE INDICATED IN HPI ? ? ?   ?Objective:  ?  ? ?BP 123/77   Pulse 65   Temp 98.4 ?F (36.9 ?C)   Ht '5\' 6"'$  (1.676 m)   Wt 202 lb (91.6 kg)   SpO2 98%   BMI 32.60 kg/m?  ? ?Wt Readings from Last 3 Encounters:  ?01/28/22 202 lb (91.6 kg)  ?07/28/21 196 lb 12.8 oz (89.3 kg)  ?06/16/21 202 lb 3.2 oz (91.7 kg)  ? ? ?BP Readings from Last 3 Encounters:  ?01/28/22 123/77  ?  07/28/21 108/62  ?06/16/21 106/71  ?  ? ?Physical Exam ?Vitals and nursing note reviewed.  ?Constitutional:   ?   General: She is not in acute distress. ?   Appearance: Normal appearance. She is normal weight.  ?HENT:  ?   Head: Normocephalic.  ?   Right Ear: External ear normal.  ?   Left Ear: External ear normal.  ?   Nose: Nose normal.  ?   Mouth/Throat:  ?   Mouth: Mucous membranes are moist.  ?Eyes:  ?   Extraocular Movements: Extraocular movements intact.  ?   Conjunctiva/sclera: Conjunctivae normal.  ?   Pupils: Pupils are equal, round, and reactive to light.  ?Cardiovascular:  ?   Rate and Rhythm: Normal rate and regular rhythm.  ?   Pulses: Normal pulses.  ?   Heart sounds: No murmur heard. ?Pulmonary:  ?   Effort: Pulmonary effort is normal.  ?   Breath sounds: Normal breath sounds.  ?Abdominal:  ?   Tenderness: There is no abdominal tenderness. There is no right CVA tenderness or left CVA tenderness.  ?Musculoskeletal:     ?   General: Normal range of motion.  ?    Cervical back: Normal range of motion.  ?Skin: ?   General: Skin is warm.  ?Neurological:  ?   General: No focal deficit present.  ?   Mental Status: She is alert and oriented to person, place, and time.  ?   Gait: Gait normal.  ?Psychiatric:     ?   Mood and Affect: Mood normal.     ?   Behavior: Behavior normal.  ? ? ?   ?Assessment & Plan:  ? ?Problem List Items Addressed This Visit   ? ?  ? Other  ? Iron deficiency anemia  ? Relevant Orders  ? CBC with Differential/Platelet (Completed)  ? IBC + Ferritin (Completed)  ? Mixed hyperlipidemia  ? Relevant Orders  ? CT CARDIAC SCORING (SELF PAY ONLY)  ? ?Other Visit Diagnoses   ? ? Frequent urination    -  Primary  ? Relevant Orders  ? POCT Urinalysis Dipstick (Completed)  ? ?  ? ? ? ?Meds ordered this encounter  ?Medications  ? scopolamine (TRANSDERM-SCOP) 1 MG/3DAYS  ?  Sig: Place 1 patch (1.5 mg total) onto the skin every 3 (three) days.  ?  Dispense:  10 patch  ?  Refill:  0  ? ?1. Frequent urination ?Reassured pt UA is clear, culture not indicated, no need for antibiotics. F/up if worse or no improvement. Limit caffeine ? ?2. Mixed hyperlipidemia ?The 10-year ASCVD risk score (Arnett DK, et al., 2019) is: 8.5% ?  Values used to calculate the score: ?    Age: 38 years ?    Sex: Female ?    Is Non-Hispanic African American: No ?    Diabetic: No ?    Tobacco smoker: No ?    Systolic Blood Pressure: 973 mmHg ?    Is BP treated: No ?    HDL Cholesterol: 59.9 mg/dL ?    Total Cholesterol: 188 mg/dL ? ?Not on a statin currently. Will order for CT Calcium Score to help with this decision. ? ?3. Other iron deficiency anemia ?Recheck iron panel, continue iron supplement ferrous sulfate 325 mg if indicated.  ? ? ?F/up 6 months for recheck or prn. ? ? ?Jevonte Clanton M Ryver Zadrozny, PA-C ?

## 2022-01-28 NOTE — Patient Instructions (Signed)
Urine clear! No indication for antibiotics. ? ?Recheck iron panel today. ? ?Order for CT Calcium Score. May need statin. ? ?Scopolamine patch for upcoming cruise - talk to your pharmacist about this. ? ? ?

## 2022-01-29 NOTE — Telephone Encounter (Signed)
Discussed with pt at office visit & ordered.  ?

## 2022-02-10 ENCOUNTER — Encounter: Payer: Self-pay | Admitting: Physician Assistant

## 2022-03-10 ENCOUNTER — Encounter: Payer: Self-pay | Admitting: Physician Assistant

## 2022-03-10 ENCOUNTER — Ambulatory Visit (INDEPENDENT_AMBULATORY_CARE_PROVIDER_SITE_OTHER)
Admission: RE | Admit: 2022-03-10 | Discharge: 2022-03-10 | Disposition: A | Discharge status: Home / Self Care | Payer: Self-pay | Source: Ambulatory Visit | Attending: Physician Assistant | Admitting: Physician Assistant

## 2022-03-10 DIAGNOSIS — E782 Mixed hyperlipidemia: Secondary | ICD-10-CM

## 2022-03-11 ENCOUNTER — Other Ambulatory Visit: Payer: Self-pay

## 2022-03-11 DIAGNOSIS — E782 Mixed hyperlipidemia: Secondary | ICD-10-CM

## 2022-03-25 ENCOUNTER — Ambulatory Visit: Payer: Medicare Other | Admitting: Neurology

## 2022-04-01 ENCOUNTER — Ambulatory Visit (INDEPENDENT_AMBULATORY_CARE_PROVIDER_SITE_OTHER): Payer: Medicare Other | Admitting: Neurology

## 2022-04-01 DIAGNOSIS — G40209 Localization-related (focal) (partial) symptomatic epilepsy and epileptic syndromes with complex partial seizures, not intractable, without status epilepticus: Secondary | ICD-10-CM | POA: Diagnosis not present

## 2022-04-01 MED ORDER — LEVETIRACETAM 500 MG PO TABS: ORAL_TABLET | ORAL | 4 refills | Status: DC

## 2022-04-01 MED ORDER — LAMOTRIGINE 150 MG PO TABS: 150.0000 mg | ORAL_TABLET | Freq: Two times a day (BID) | ORAL | 4 refills | Status: DC

## 2022-04-01 NOTE — Progress Notes (Signed)
Patient: Summer [name] Date of Birth: 06-17-1951  Reason for Visit: Follow up for seizure History from: Patient Primary Neurologist: Dr.Yan  ASSESSMENT AND PLAN 71 y.o. year old female   75.  Complex partial seizure with secondary generalization, last seizure was in 2016 -For now, prefers to remain on dual AED, continue Keppra 500 mg twice a day, Lamictal 150 mg twice daily -Update CMP, drug levels -If we do move to single agent, would increase Keppra 500/1000 mg daily, slow taper off Lamictal, check EEG beforehand -Follow-up in 1 year or sooner if needed  HISTORY  Summer [name] is a 71 year old female, seen in refer by her primary care doctor Briscoe Deutscher, for evaluation of seizure, facial paresthesia, initial evaluation was on January 18, 2018.   She had a history of osteoporosis, is taking Prolia 60 mg every 6 months   History of complex partial seizure with secondary generalization   I reviewed previous neurology evaluation from [facility] clinic by Dr.Jehi   First seizure was in the labor room at age 63 after delivery, no warning signs,   Around 2015, she started binge drink for 3-4 years, she had recurrent seizure in February 2015, patient was amnestic of the event, husband got a call from her primary care physician the patient had a seizure during her routine appointment, with tongue biting, was admitted to [facility],     CT, MRI of the brain with and without contrast showed mild supratentorium small vessel disease, EEG showed no acute abnormality, no antiepileptic medication was given, record mentions that was thought to be due to alcohol withdrawal, patient stated that she was drinking heavily, hard liquor on a daily basis, but did stop drinking for weeks prior to the event in February 2015.   Second event July 2015, was at a work picnic, then woke up on the hospital, was witnessed by a physician there that she had a seizure, loss of consciousness with  tonic-clonic activity of the extremities that lasted 5 minutes, followed by 30-60 minutes post ictal confusion, Keppra was started at 750 twice a day, later was increased to 1000 mg twice a day later, patient complains of feeling sleepy,   Third event was in [place], she was witnessed by her friend that she has transient episode of confusion,   Her medication was later changed to lamotrigine 150 mg twice a day, Keppra 500 mg twice daily, stayed on that regimen since, she is tolerating the medication well, there was no recurrent seizures.   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   Today she also complains of more than 3 years history of constant right facial paresthesia, burning hypersensitivity, mild asymmetry of the face, she noticed more wrinkles in her left lower face while woke up in the morning time.   Laboratory evaluation in January 2019 showed mild elevated LDL 101, negative troponin, INR 1.01, d-dimer was mildly elevated at 0.95, negative troponin, normal BMP, CBC    UPDATE Sept 5 2019: She is doing very well, had no recurrent seizure, has quit drinking. Now taking keppra '500mg'$  bid and lamotrigine '150mg'$  bid.    We personally reviewed MRI brain w/wo in May 2019, there are chronic small vessel disease, no acute abnormalities.   EEG was normal in June 2019   Update November 13, 2019 SS: Since last seen, she  remained on both Lamictal and Keppra, she was nervous to come off the Lamictal quickly, after speaking with [facility] clinic she decided to remain on dual therapy.  She has continued to do well, has not had recurrent seizure.  She is open to coming off the Lamictal, if done slowly.  She has continued right-sided facial paresthesia, pressure to right face, some asymmetry on the right x  3 years.  Overall, has been doing well, has her grandson with her today.    Update November 14, 2020 SS: Here today for follow-up via virtual visit, continues to do well, no recurrent seizures (last in 2016ish).  Has decided to remain on Keppra 500 mg twice a day, Lamictal 150 mg twice a day.  Previously when she saw Dr. Krista Blue in 2019, decided to taper off Lamictal, do higher dose Keppra 500/1000.  She felt more comfortable with dual treatment, worried she would have a seizure, lose her driver's license in [place]. Moved from [place]. She takes care of her 2 grandkids during the day.  Is on vitamin D supplement.  Doing well, sees Dr. Rogers Blocker PCP.   Update November 19, 2021 SS: Here today via telephone visit, couldn't connect via my chart. Couldn't come into the office due to [facility] exposure. Health is good. Going to the gym 4-5 days a week taking aerobics class. No seizures. Last was in 2015. Hasn't wanted to reduce AED to single treatment.  Takes care of her grandchildren. Remains on Vitamin D, drives. On oral iron.   Update 04/01/22 SS: Doing well, remains on Keppra 500 mg twice a day, Lamictal 150 mg twice daily.  Has decided to stay on dual treatment, even though in 2019 plan was to increase Keppra 500/1000 mg daily, taper off Lamictal.  She cares for her to prevent she is on Prolia, takes vitamin D.  She is very active, goes to the gym.  Has no adverse effects from AED.   REVIEW OF SYSTEMS: Out of a complete 14 system review of symptoms, the patient complains only of the following symptoms, and all other reviewed systems are negative.  See HPI  ALLERGIES: Allergies  Allergen Reactions   Bee Venom Swelling    HOME MEDICATIONS: Outpatient Medications Prior to Visit  Medication Sig Dispense Refill   Acetaminophen (TYLENOL PO) Take by mouth as needed.     albuterol (VENTOLIN HFA) 108 (90 Base) MCG/ACT inhaler Inhale 2 puffs into the lungs every 6 (six) hours as needed for wheezing or shortness of breath. 8 g 0    Ascorbic Acid (VITAMIN C) 100 MG tablet Take 100 mg by mouth daily.     Calcium-Vitamin D-Vitamin K [id number] MG-UNT-MCG TABS Take 1 tablet by mouth 2 (two) times daily. Also includes Vitamin C     Cholecalciferol 100 MCG (4000 UT) CAPS Take 4,000 Units by mouth daily.     COVID-19 mRNA bivalent vaccine, Pfizer, (PFIZER COVID-19 VAC BIVALENT) injection Inject into the muscle. 0.3 mL 0   denosumab (PROLIA) 60 MG/ML SOLN injection Inject 60 mg into the skin every 6 (six) months. Administer in upper arm, thigh, or abdomen     FEROSUL 325 (65 Fe) MG tablet TAKE ONE TABLET BY MOUTH DAILY WITH VITAMIN CAPSULE(S) 30 tablet 2   Omega-3 Fatty Acids (OMEGA 3 PO) Take 1 capsule by mouth daily.     scopolamine (TRANSDERM-SCOP) 1 MG/3DAYS Place 1 patch (1.5 mg total) onto the skin every 3 (three) days. 10 patch 0   vitamin E 600 UNIT  capsule Take 600 Units by mouth daily.     lamoTRIgine (LAMICTAL) 150 MG tablet Take 1 tablet (150 mg total) by mouth 2 (two) times daily. 180 tablet 4   levETIRAcetam (KEPPRA) 500 MG tablet Take 1 tablet twice daily 180 tablet 4   EPINEPHrine 0.3 mg/0.3 mL IJ SOAJ injection Inject 0.3 mLs (0.3 mg total) into the muscle as needed for anaphylaxis. 2 each 1   No facility-administered medications prior to visit.    PAST MEDICAL HISTORY: Past Medical History:  Diagnosis Date   Adenomatous polyp of sigmoid colon 03/18/2017   Overview:  Added automatically from request for surgery [phone]   Alcoholism ([place])    01/18/18 - no use in 5 years   Asymptomatic varicose veins 01/11/2017   Cancer ([place])    skin ca   Iron deficiency anemia 01/12/2017   Last Assessment & Plan:  Given script for ferrous sulfate and order for repeat cbc with diff and iron studies to be done in 1 month Will also refer pt back to [facility] for repeat colonoscopy and to dietician for nutritional counseling, per pt request   Lentigines 01/11/2017   Melanocytic nevi of left lower limb, including hip 01/11/2017    Melanocytic nevi of left upper limb, including shoulder 01/11/2017   Melanocytic nevi of right lower limb, including hip 01/11/2017   Melanocytic nevi of right upper limb, including shoulder 01/11/2017   Melanocytic nevi of trunk 01/11/2017   Obesity (BMI 30-39.9) 05/21/2017   Osteoporosis    Plantar fasciitis    Primary osteoarthritis, right shoulder 12/11/2016   Seizure ([place])    LAST SEIZURE- 2016 per pt   Sleep apnea    no cpap per pt   Unilateral primary osteoarthritis, left knee 12/11/2016    PAST SURGICAL HISTORY: Past Surgical History:  Procedure Laterality Date   BARIATRIC SURGERY     COLONOSCOPY  03/18/2017   in [place].   ECTOPIC PREGNANCY SURGERY     POLYPECTOMY     SIGMOIDOSCOPY     in [place].   TOTAL KNEE ARTHROPLASTY Left 09/07/2017   Procedure: LEFT TOTAL KNEE ARTHROPLASTY;  Surgeon: Paralee Cancel, MD;  Location: [facility];  Service: Orthopedics;  Laterality: Left;  Adductor Block    FAMILY HISTORY: Family History  Problem Relation Age of Onset   Mitral valve prolapse Mother    Osteoporosis Mother    Memory loss Mother    Colon cancer Father 70   Alcoholism Father    Colon polyps Neg Hx    Esophageal cancer Neg Hx    Rectal cancer Neg Hx    Stomach cancer Neg Hx     SOCIAL HISTORY: Social History   Socioeconomic History   Marital status: Married    Spouse name: Not on file   Number of children: 1   Years of education: college   Highest education level: Bachelor's degree (e.g., BA, AB, BS)  Occupational History   Occupation: Retired  Tobacco Use   Smoking status: Never   Smokeless tobacco: Never  Vaping Use   Vaping Use: Never used  Substance and Sexual Activity   Alcohol use: No   Drug use: No   Sexual activity: Never  Other Topics Concern   Not on file  Social History Narrative   Lives at home with husband.   Right-handed.   3 cups caffeine per day.   Cares for 2 grandchildren 3 days per week    Social Determinants of Systems developer  Strain: Low Risk    Difficulty of Paying Living Expenses: Not hard at all  Food Insecurity: No Food Insecurity   Worried About Charity fundraiser in the Last Year: Never true   Ran Out of Food in the Last Year: Never true  Transportation Needs: No Transportation Needs   Lack of Transportation (Medical): No   Lack of Transportation (Non-Medical): No  Physical Activity: Sufficiently Active   Days of Exercise per Week: 5 days   Minutes of Exercise per Session: 120 min  Stress: No Stress Concern Present   Feeling of Stress : Not at all  Social Connections: Moderately Integrated   Frequency of Communication with Friends and Family: More than three times a week   Frequency of Social Gatherings with Friends and Family: More than three times a week   Attends Religious Services: Never   Marine scientist or Organizations: Yes   Attends Archivist Meetings: 1 to 4 times per year   Marital Status: Married  Human resources officer Violence: Not At Risk   Fear of Current or Ex-Partner: No   Emotionally Abused: No   Physically Abused: No   Sexually Abused: No    PHYSICAL EXAM  Vitals:   04/01/22 0818  BP: 114/72  Pulse: 65  Weight: 199 lb (90.3 kg)  Height: '5\' 5"'$  (1.651 m)   Body mass index is 33.12 kg/m.  Generalized: Well developed, in no acute distress  Neurological examination  Mentation: Alert oriented to time, place, history taking. Follows all commands speech and language fluent Cranial nerve II-XII: Pupils were equal round reactive to light. Extraocular movements were full, visual field were full on confrontational test. Facial sensation and strength were normal. Head turning and shoulder shrug  were normal and symmetric. Motor: The motor testing reveals 5 over 5 strength of all 4 extremities. Good symmetric motor tone is noted throughout.  Sensory: Sensory testing is intact to soft touch on all 4 extremities. No evidence of extinction is noted.  Coordination:  Cerebellar testing reveals good finger-nose-finger and heel-to-shin bilaterally.  Gait and station: Gait is normal. Tandem gait is slightly unsteady. Reflexes: Deep tendon reflexes are symmetric and normal bilaterally.   DIAGNOSTIC DATA (LABS, IMAGING, TESTING) - I reviewed patient records, labs, notes, testing and imaging myself where available.  Lab Results  Component Value Date   WBC 5.4 01/28/2022   HGB 13.7 01/28/2022   HCT 40.8 01/28/2022   MCV 92.7 01/28/2022   PLT 235.0 01/28/2022      Component Value Date/Time   NA 137 06/16/2021 1341   K 3.7 06/16/2021 1341   CL 101 06/16/2021 1341   CO2 27 06/16/2021 1341   GLUCOSE 94 06/16/2021 1341   BUN 15 06/16/2021 1341   CREATININE 0.70 06/16/2021 1341   CREATININE 0.63 04/13/2018 0741   CALCIUM 9.5 06/16/2021 1341   PROT 7.2 06/16/2021 1341   ALBUMIN 4.4 06/16/2021 1341   AST 19 06/16/2021 1341   ALT 12 06/16/2021 1341   ALKPHOS 73 06/16/2021 1341   BILITOT 0.4 06/16/2021 1341   GFRNONAA >60 11/04/2017 1311   GFRAA >60 11/04/2017 1311   Lab Results  Component Value Date   CHOL 188 06/16/2021   HDL 59.90 06/16/2021   LDLCALC 115 (H) 06/16/2021   TRIG 68.0 06/16/2021   CHOLHDL 3 06/16/2021   No results found for: HGBA1C No results found for: [id number] Lab Results  Component Value Date   TSH 4.02 08/25/2021    Butler Denmark,  AGNP-C, DNP 04/01/2022, 8:43 AM [facility] [address], [place], [place] [address] [phone]

## 2022-04-02 ENCOUNTER — Encounter: Payer: Self-pay | Admitting: Neurology

## 2022-04-02 LAB — COMPREHENSIVE METABOLIC PANEL
ALT: 16 IU/L (ref 0–32)
AST: 22 IU/L (ref 0–40)
Albumin/Globulin Ratio: 2.3 — ABNORMAL HIGH (ref 1.2–2.2)
Albumin: 4.5 g/dL (ref 3.8–4.8)
Alkaline Phosphatase: 96 IU/L (ref 44–121)
BUN/Creatinine Ratio: 16 (ref 12–28)
BUN: 11 mg/dL (ref 8–27)
Bilirubin Total: 0.5 mg/dL (ref 0.0–1.2)
CO2: 27 mmol/L (ref 20–29)
Calcium: 9.6 mg/dL (ref 8.7–10.3)
Chloride: 100 mmol/L (ref 96–106)
Creatinine, Ser: 0.69 mg/dL (ref 0.57–1.00)
Globulin, Total: 2 g/dL (ref 1.5–4.5)
Glucose: 87 mg/dL (ref 70–99)
Potassium: 4.4 mmol/L (ref 3.5–5.2)
Sodium: 142 mmol/L (ref 134–144)
Total Protein: 6.5 g/dL (ref 6.0–8.5)
eGFR: 93 mL/min/{1.73_m2} (ref >59)

## 2022-04-02 LAB — LAMOTRIGINE LEVEL: Lamotrigine Lvl: 5.6 ug/mL (ref 2.0–20.0)

## 2022-04-02 LAB — LEVETIRACETAM LEVEL: Levetiracetam Lvl: 10.3 ug/mL (ref 10.0–40.0)

## 2022-04-21 ENCOUNTER — Other Ambulatory Visit: Payer: Self-pay | Admitting: Physician Assistant

## 2022-06-03 ENCOUNTER — Encounter (INDEPENDENT_AMBULATORY_CARE_PROVIDER_SITE_OTHER): Payer: Self-pay

## 2022-07-08 ENCOUNTER — Encounter: Payer: Self-pay | Admitting: Physician Assistant

## 2022-07-09 NOTE — Telephone Encounter (Signed)
Please see pt note and advise

## 2022-07-10 ENCOUNTER — Ambulatory Visit: Payer: Medicare Other | Attending: Internal Medicine | Admitting: Internal Medicine

## 2022-07-10 ENCOUNTER — Encounter: Payer: Self-pay | Admitting: Internal Medicine

## 2022-07-10 ENCOUNTER — Ambulatory Visit: Payer: Medicare Other | Admitting: Physician Assistant

## 2022-07-10 ENCOUNTER — Encounter: Payer: Self-pay | Admitting: Physician Assistant

## 2022-07-10 VITALS — BP 118/74 | HR 63 | Ht 66.0 in | Wt 198.6 lb

## 2022-07-10 VITALS — BP 113/65 | HR 64 | Temp 98.7°F | Ht 66.0 in | Wt 199.6 lb

## 2022-07-10 DIAGNOSIS — R238 Other skin changes: Secondary | ICD-10-CM

## 2022-07-10 DIAGNOSIS — R931 Abnormal findings on diagnostic imaging of heart and coronary circulation: Secondary | ICD-10-CM

## 2022-07-10 DIAGNOSIS — E785 Hyperlipidemia, unspecified: Secondary | ICD-10-CM | POA: Diagnosis not present

## 2022-07-10 MED ORDER — FLUCONAZOLE 150 MG PO TABS: 150.0000 mg | ORAL_TABLET | Freq: Every day | ORAL | 0 refills | Status: DC

## 2022-07-10 MED ORDER — NYSTATIN 100000 UNIT/GM EX CREA: 1.0000 | TOPICAL_CREAM | Freq: Two times a day (BID) | CUTANEOUS | 0 refills | Status: DC

## 2022-07-10 MED ORDER — ROSUVASTATIN CALCIUM 20 MG PO TABS: 20.0000 mg | ORAL_TABLET | Freq: Every day | ORAL | 3 refills | Status: DC

## 2022-07-10 NOTE — Progress Notes (Signed)
LIPID CLINIC CONSULT NOTE  Chief Complaint:  Manage dyslipidemia  Primary Care Physician: Allwardt, Randa Evens, PA-C  Primary Cardiologist:  None  HPI:  Summer [name] is a 71 y.o. female who is being seen today for the evaluation of dyslipidemia at the request of Allwardt, Alyssa M, PA-C. This is a pleasant 71 year old female kindly referred for evaluation management of dyslipidemia.  She recently underwent calcium scoring to better restratify her.  The calcium score was abnormal resulting total of 896, 96 percentile for age and sex matched controls.  She has not been on any lipid-lowering therapy prior to this.  It was noted that her recent lipid profile showed total cholesterol 188, triglycerides 68, HDL 60 and LDL 115.  Other medical problems include prior history of alcoholism but abstinent for more than 5 years, iron deficiency anemia, osteoporosis and seizures on combination of Lamictal and Keppra.  There is no family history of early onset heart disease.  She tries to eat a diet low in saturated fats.  PMHx:  Past Medical History:  Diagnosis Date   Adenomatous polyp of sigmoid colon 03/18/2017   Overview:  Added automatically from request for surgery [phone]   Alcoholism ([place])    01/18/18 - no use in 5 years   Asymptomatic varicose veins 01/11/2017   Cancer ([place])    skin ca   Iron deficiency anemia 01/12/2017   Last Assessment & Plan:  Given script for ferrous sulfate and order for repeat cbc with diff and iron studies to be done in 1 month Will also refer pt back to CRS for repeat colonoscopy and to dietician for nutritional counseling, per pt request   Lentigines 01/11/2017   Melanocytic nevi of left lower limb, including hip 01/11/2017   Melanocytic nevi of left upper limb, including shoulder 01/11/2017   Melanocytic nevi of right lower limb, including hip 01/11/2017   Melanocytic nevi of right upper limb, including shoulder 01/11/2017   Melanocytic nevi of trunk 01/11/2017    Obesity (BMI 30-39.9) 05/21/2017   Osteoporosis    Plantar fasciitis    Primary osteoarthritis, right shoulder 12/11/2016   Seizure ([place])    LAST SEIZURE- 2016 per pt   Sleep apnea    no cpap per pt   Unilateral primary osteoarthritis, left knee 12/11/2016    Past Surgical History:  Procedure Laterality Date   BARIATRIC SURGERY     COLONOSCOPY  03/18/2017   in [place].   ECTOPIC PREGNANCY SURGERY     POLYPECTOMY     SIGMOIDOSCOPY     in [place].   TOTAL KNEE ARTHROPLASTY Left 09/07/2017   Procedure: LEFT TOTAL KNEE ARTHROPLASTY;  Surgeon: Paralee Cancel, MD;  Location: [facility];  Service: Orthopedics;  Laterality: Left;  Adductor Block    FAMHx:  Family History  Problem Relation Age of Onset   Mitral valve prolapse Mother    Osteoporosis Mother    Memory loss Mother    Colon cancer Father 53   Alcoholism Father    Colon polyps Neg Hx    Esophageal cancer Neg Hx    Rectal cancer Neg Hx    Stomach cancer Neg Hx     SOCHx:   reports that she has never smoked. She has never used smokeless tobacco. She reports that she does not drink alcohol and does not use drugs.  ALLERGIES:  Allergies  Allergen Reactions   Bee Venom Swelling    ROS: Pertinent items noted in HPI and remainder of comprehensive ROS  otherwise negative.  HOME MEDS: Current Outpatient Medications on File Prior to Visit  Medication Sig Dispense Refill   Acetaminophen (TYLENOL PO) Take by mouth as needed.     albuterol (VENTOLIN HFA) 108 (90 Base) MCG/ACT inhaler Inhale 2 puffs into the lungs every 6 (six) hours as needed for wheezing or shortness of breath. 8 g 0   Ascorbic Acid (VITAMIN C) 100 MG tablet Take 100 mg by mouth daily.     Calcium-Vitamin D-Vitamin K [id number] MG-UNT-MCG TABS Take 1 tablet by mouth 2 (two) times daily. Also includes Vitamin C     Cholecalciferol 100 MCG (4000 UT) CAPS Take 4,000 Units by mouth daily.     denosumab (PROLIA) 60 MG/ML SOLN injection Inject 60 mg into the skin every 6  (six) months. Administer in upper arm, thigh, or abdomen     EPINEPHrine 0.3 mg/0.3 mL IJ SOAJ injection Inject 0.3 mLs (0.3 mg total) into the muscle as needed for anaphylaxis. 2 each 1   FEROSUL 325 (65 Fe) MG tablet TAKE ONE TABLET BY MOUTH DAILY WITH VITAMIN C. 30 tablet 2   lamoTRIgine (LAMICTAL) 150 MG tablet Take 1 tablet (150 mg total) by mouth 2 (two) times daily. 180 tablet 4   levETIRAcetam (KEPPRA) 500 MG tablet Take 1 tablet twice daily 180 tablet 4   Omega-3 Fatty Acids (OMEGA 3 PO) Take 1 capsule by mouth daily.     vitamin E 600 UNIT capsule Take 600 Units by mouth daily.     No current facility-administered medications on file prior to visit.    LABS/IMAGING: No results found for this or any previous visit (from the past 48 hour(s)). No results found.  LIPID PANEL:    Component Value Date/Time   CHOL 188 06/16/2021 1341   TRIG 68.0 06/16/2021 1341   HDL 59.90 06/16/2021 1341   CHOLHDL 3 06/16/2021 1341   VLDL 13.6 06/16/2021 1341   LDLCALC 115 (H) 06/16/2021 1341    WEIGHTS: Wt Readings from Last 3 Encounters:  07/10/22 198 lb 9.6 oz (90.1 kg)  04/01/22 199 lb (90.3 kg)  01/28/22 202 lb (91.6 kg)    VITALS: BP 118/74   Pulse 63   Ht '5\' 6"'$  (1.676 m)   Wt 198 lb 9.6 oz (90.1 kg)   SpO2 99%   BMI 32.05 kg/m   EXAM: General appearance: alert and no distress Lungs: clear to auscultation bilaterally Heart: regular rate and rhythm, S1, S2 normal, no murmur, click, rub or gallop Extremities: extremities normal, atraumatic, no cyanosis or edema Neurologic: Grossly normal  EKG: Deferred  ASSESSMENT: Mixed dyslipidemia, goal LDL less than 70 High coronary calcium score 896, 96 percentile for age and sex matched controls  PLAN: 1.   Ms. Tsui has a high coronary calcium score showing multivessel coronary calcification.  She is asymptomatic and exercises fairly regularly.  She has few heart disease issues in her family.  She has not been previously been  on any lipid-lowering therapy.  I would advise she start on rosuvastatin 20 mg daily which would be compatible with her antiseizure medications.  We will plan a lipid NMR and LP(a) in 3 to 4 months and follow-up at that time.  Thanks again for the kind referral.  Pixie Casino, MD, FACC, [place] Director of the [facility]  [facility] Diplomate of the American Board of Clinical Lipidology Attending Cardiologist  Direct Dial: [phone]  Fax:  [phone]  Website:  [email or web address] 07/10/2022, 8:16 AM

## 2022-07-10 NOTE — Progress Notes (Signed)
Subjective:    Patient ID: Summer [name], female    DOB: 1951/03/01, 71 y.o.   MRN: [id number]  Chief Complaint  Patient presents with   Rash    Skin rash hasnt gone away even after so many methods.    Follow-up    HPI Patient is in today for rash inner thighs / vaginal area. Going on since Feb 2023. Monistat 7, cleaning daily, antibacterial soap, changed detergents; no prescriptions though. Not itching, some odor when it flares up worse. No pain. Goes to the gym often and always changes her clothes.  Past Medical History:  Diagnosis Date   Adenomatous polyp of sigmoid colon 03/18/2017   Overview:  Added automatically from request for surgery [phone]   Alcoholism ([place])    01/18/18 - no use in 5 years   Asymptomatic varicose veins 01/11/2017   Cancer ([place])    skin ca   Iron deficiency anemia 01/12/2017   Last Assessment & Plan:  Given script for ferrous sulfate and order for repeat cbc with diff and iron studies to be done in 1 month Will also refer pt back to [facility] for repeat colonoscopy and to dietician for nutritional counseling, per pt request   Lentigines 01/11/2017   Melanocytic nevi of left lower limb, including hip 01/11/2017   Melanocytic nevi of left upper limb, including shoulder 01/11/2017   Melanocytic nevi of right lower limb, including hip 01/11/2017   Melanocytic nevi of right upper limb, including shoulder 01/11/2017   Melanocytic nevi of trunk 01/11/2017   Obesity (BMI 30-39.9) 05/21/2017   Osteoporosis    Plantar fasciitis    Primary osteoarthritis, right shoulder 12/11/2016   Seizure ([place])    LAST SEIZURE- 2016 per pt   Sleep apnea    no cpap per pt   Unilateral primary osteoarthritis, left knee 12/11/2016    Past Surgical History:  Procedure Laterality Date   BARIATRIC SURGERY     COLONOSCOPY  03/18/2017   in [place].   ECTOPIC PREGNANCY SURGERY     POLYPECTOMY     SIGMOIDOSCOPY     in [place].   TOTAL KNEE ARTHROPLASTY Left 09/07/2017   Procedure: LEFT TOTAL  KNEE ARTHROPLASTY;  Surgeon: Paralee Cancel, MD;  Location: [facility];  Service: Orthopedics;  Laterality: Left;  Adductor Block    Family History  Problem Relation Age of Onset   Mitral valve prolapse Mother    Osteoporosis Mother    Memory loss Mother    Colon cancer Father 20   Alcoholism Father    Colon polyps Neg Hx    Esophageal cancer Neg Hx    Rectal cancer Neg Hx    Stomach cancer Neg Hx     Social History   Tobacco Use   Smoking status: Never   Smokeless tobacco: Never  Vaping Use   Vaping Use: Never used  Substance Use Topics   Alcohol use: No   Drug use: No     Allergies  Allergen Reactions   Bee Venom Swelling    Review of Systems NEGATIVE UNLESS OTHERWISE INDICATED IN HPI      Objective:     BP 113/65 (BP Location: Left Arm, Patient Position: Sitting)   Pulse 64   Temp 98.7 F (37.1 C) (Temporal)   Ht '5\' 6"'$  (1.676 m)   Wt 199 lb 9.6 oz (90.5 kg)   SpO2 98%   BMI 32.22 kg/m   Wt Readings from Last 3 Encounters:  07/10/22 199 lb 9.6  oz (90.5 kg)  07/10/22 198 lb 9.6 oz (90.1 kg)  04/01/22 199 lb (90.3 kg)    BP Readings from Last 3 Encounters:  07/10/22 113/65  07/10/22 118/74  04/01/22 114/72     Physical Exam Constitutional:      Appearance: Normal appearance.  Abdominal:     General: Abdomen is flat.     Palpations: Abdomen is soft.  Genitourinary:    Labia:        Right: No rash.        Left: No rash.      Comments: Minor odor noted while examining skin around inner thighs. Musculoskeletal:     Right lower leg: No edema.     Left lower leg: No edema.  Skin:    General: Skin is warm.     Findings: No lesion or rash.     Comments: No rash noted to inner thighs or vaginal area.  Neurological:     General: No focal deficit present.     Mental Status: She is alert.  Psychiatric:        Mood and Affect: Mood normal.        Behavior: Behavior normal.        Assessment & Plan:  Skin irritation  Other orders -      Nystatin; Apply 1 Application topically 2 (two) times daily.  Dispense: 30 g; Refill: 0 -     Fluconazole; Take 1 tablet (150 mg total) by mouth daily.  Dispense: 1 tablet; Refill: 0   Discussed with patient and reassured her that I do not see any rash whatsoever in the area she is pointing to.  Asked her about possible shingles in her past, and she denies ever having shingles in this area.  I do not see any masses or other lesions.  By her description, it is possible that she is having some yeast that is coming and going and causing some bother her some symptoms.  Encouraged her to keep up her good hygiene.  She can use nystatin cream if she needs if she sees a rash that comes up.  If she is worried about any kind of vaginal yeast infection, she can take 1 tablet of Diflucan.  Patient knows to recheck and call if any new concerns,.    Return if symptoms worsen or fail to improve.  This note was prepared with assistance of Systems analyst. Occasional wrong-word or sound-a-like substitutions may have occurred due to the inherent limitations of voice recognition software.      Jaleah Lefevre M Calixto Pavel, PA-C

## 2022-07-10 NOTE — Patient Instructions (Signed)
Medication Instructions:  START crestor '20mg'$  daily for cholesterol   *If you need a refill on your cardiac medications before your next appointment, please call your pharmacy*   Lab Work: FASTING lab work to check cholesterol in 3-4 months -- complete about 1 week before next appointment with Dr. Debara Pickett   If you have labs (blood work) drawn today and your tests are completely normal, you will receive your results only by: [place] (if you have MyChart) OR A paper copy in the mail If you have any lab test that is abnormal or we need to change your treatment, we will call you to review the results.   Follow-Up: At [facility], you and your health needs are our priority.  As part of our continuing mission to provide you with exceptional heart care, we have created designated Provider Care Teams.  These Care Teams include your primary Cardiologist (physician) and Advanced Practice Providers (APPs -  Physician Assistants and Nurse Practitioners) who all work together to provide you with the care you need, when you need it.  We recommend signing up for the patient portal called "MyChart".  Sign up information is provided on this After Visit Summary.  MyChart is used to connect with patients for Virtual Visits (Telemedicine).  Patients are able to view lab/test results, encounter notes, upcoming appointments, etc.  Non-urgent messages can be sent to your provider as well.   To learn more about what you can do with MyChart, go to [email or web address].    Your next appointment:    3-4 months with Dr. Debara Pickett

## 2022-07-20 ENCOUNTER — Encounter: Payer: Self-pay | Admitting: *Deleted

## 2022-07-24 ENCOUNTER — Other Ambulatory Visit (HOSPITAL_BASED_OUTPATIENT_CLINIC_OR_DEPARTMENT_OTHER): Payer: Self-pay

## 2022-07-28 ENCOUNTER — Other Ambulatory Visit (HOSPITAL_BASED_OUTPATIENT_CLINIC_OR_DEPARTMENT_OTHER): Payer: Self-pay

## 2022-07-28 ENCOUNTER — Other Ambulatory Visit: Payer: Self-pay

## 2022-07-28 ENCOUNTER — Encounter: Payer: Self-pay | Admitting: Physician Assistant

## 2022-07-28 DIAGNOSIS — R238 Other skin changes: Secondary | ICD-10-CM

## 2022-07-28 MED ORDER — AREXVY 120 MCG/0.5ML IM SUSR
INTRAMUSCULAR | 0 refills | Status: DC
Start: 2022-07-28 — End: 2022-08-11
  Filled 2022-07-28: qty 0.5, 1d supply, fill #0

## 2022-08-10 ENCOUNTER — Ambulatory Visit: Payer: Medicare Other

## 2022-08-11 ENCOUNTER — Encounter: Payer: Self-pay | Admitting: Family Medicine

## 2022-08-11 ENCOUNTER — Ambulatory Visit: Payer: Medicare Other | Admitting: Family Medicine

## 2022-08-11 VITALS — BP 110/60 | HR 68 | Temp 97.3°F | Ht 66.0 in | Wt 203.0 lb

## 2022-08-11 DIAGNOSIS — J069 Acute upper respiratory infection, unspecified: Secondary | ICD-10-CM | POA: Diagnosis not present

## 2022-08-11 MED ORDER — AZITHROMYCIN 250 MG PO TABS: ORAL_TABLET | ORAL | 0 refills | Status: AC

## 2022-08-11 NOTE — Patient Instructions (Signed)
Hold the rosuvastatin while on the antibiotic.

## 2022-08-11 NOTE — Progress Notes (Signed)
Subjective:     Patient ID: Summer [name], female    DOB: 02-24-1951, 71 y.o.   MRN: [id number]  Chief Complaint  Patient presents with   Cough    Cough with mucus that started Friday after traveling   Sneezing   Fatigue    HPI Cough, sneezing, fatigue since 10/13.  Covid neg on 10/15.  Had gone to [place]-flew.  No f/c.  Some mild hoarseness. Wet cough.  No sob.  No v/d.  Loose stools today. Friend was sick and finishing abx when pt got there.  No allergies.  Health Maintenance Due  Topic Date Due   MAMMOGRAM  07/31/2022    Past Medical History:  Diagnosis Date   Adenomatous polyp of sigmoid colon 03/18/2017   Overview:  Added automatically from request for surgery [phone]   Alcoholism ([place])    01/18/18 - no use in 5 years   Asymptomatic varicose veins 01/11/2017   Cancer ([facility])    skin ca   Iron deficiency anemia 01/12/2017   Last Assessment & Plan:  Given script for ferrous sulfate and order for repeat cbc with diff and iron studies to be done in 1 month Will also refer pt back to [facility] for repeat colonoscopy and to dietician for nutritional counseling, per pt request   Lentigines 01/11/2017   Melanocytic nevi of left lower limb, including hip 01/11/2017   Melanocytic nevi of left upper limb, including shoulder 01/11/2017   Melanocytic nevi of right lower limb, including hip 01/11/2017   Melanocytic nevi of right upper limb, including shoulder 01/11/2017   Melanocytic nevi of trunk 01/11/2017   Obesity (BMI 30-39.9) 05/21/2017   Osteoporosis    Plantar fasciitis    Primary osteoarthritis, right shoulder 12/11/2016   Seizure ([place])    LAST SEIZURE- 2016 per pt   Sleep apnea    no cpap per pt   Unilateral primary osteoarthritis, left knee 12/11/2016    Past Surgical History:  Procedure Laterality Date   BARIATRIC SURGERY     COLONOSCOPY  03/18/2017   in [place].   ECTOPIC PREGNANCY SURGERY     POLYPECTOMY     SIGMOIDOSCOPY     in [place].   TOTAL KNEE ARTHROPLASTY Left  09/07/2017   Procedure: LEFT TOTAL KNEE ARTHROPLASTY;  Surgeon: Paralee Cancel, MD;  Location: [facility];  Service: Orthopedics;  Laterality: Left;  Adductor Block    Outpatient Medications Prior to Visit  Medication Sig Dispense Refill   Acetaminophen (TYLENOL PO) Take by mouth as needed.     albuterol (VENTOLIN HFA) 108 (90 Base) MCG/ACT inhaler Inhale 2 puffs into the lungs every 6 (six) hours as needed for wheezing or shortness of breath. 8 g 0   Ascorbic Acid (VITAMIN C) 100 MG tablet Take 100 mg by mouth daily.     aspirin EC 81 MG tablet Take 81 mg by mouth daily. Swallow whole.     Calcium-Vitamin D-Vitamin K 638-4665-99 MG-UNT-MCG TABS Take 1 tablet by mouth 2 (two) times daily. Also includes Vitamin C     Cholecalciferol 100 MCG (4000 UT) CAPS Take 4,000 Units by mouth daily.     denosumab (PROLIA) 60 MG/ML SOLN injection Inject 60 mg into the skin every 6 (six) months. Administer in upper arm, thigh, or abdomen     EPINEPHrine 0.3 mg/0.3 mL IJ SOAJ injection Inject 0.3 mLs (0.3 mg total) into the muscle as needed for anaphylaxis. 2 each 1   FEROSUL 325 (65 Fe) MG tablet  TAKE ONE TABLET BY MOUTH DAILY WITH VITAMIN C. 30 tablet 2   lamoTRIgine (LAMICTAL) 150 MG tablet Take 1 tablet (150 mg total) by mouth 2 (two) times daily. 180 tablet 4   levETIRAcetam (KEPPRA) 500 MG tablet Take 1 tablet twice daily 180 tablet 4   nystatin cream (MYCOSTATIN) Apply 1 Application topically 2 (two) times daily. 30 g 0   rosuvastatin (CRESTOR) 20 MG tablet Take 1 tablet (20 mg total) by mouth daily. 90 tablet 3   vitamin E 600 UNIT capsule Take 600 Units by mouth daily.     RSV vaccine recomb adjuvanted (AREXVY) 120 MCG/0.5ML injection Inject into the muscle. 0.5 mL 0   fluconazole (DIFLUCAN) 150 MG tablet Take 1 tablet (150 mg total) by mouth daily. (Patient not taking: Reported on 08/11/2022) 1 tablet 0   Omega-3 Fatty Acids (OMEGA 3 PO) Take 1 capsule by mouth daily. (Patient not taking: Reported on  08/11/2022)     No facility-administered medications prior to visit.    Allergies  Allergen Reactions   Bee Venom Swelling   ROS neg/noncontributory except as noted HPI/below      Objective:     BP 110/60   Pulse 68   Temp (!) 97.3 F (36.3 C) (Temporal)   Ht '5\' 6"'$  (1.676 m)   Wt 203 lb (92.1 kg)   SpO2 99%   BMI 32.77 kg/m  Wt Readings from Last 3 Encounters:  08/11/22 203 lb (92.1 kg)  07/10/22 199 lb 9.6 oz (90.5 kg)  07/10/22 198 lb 9.6 oz (90.1 kg)    Physical Exam   Gen: WDWN NAD HEENT: NCAT, conjunctiva not injected, sclera nonicteric TM WNL B, OP moist, no exudates  NECK:  supple, no thyromegaly, no nodes, no carotid bruits CARDIAC: RRR, S1S2+, no murmur. LUNGS: CTAB. No wheezes EXT:  no edema MSK: no gross abnormalities.  NEURO: A&O x3.  CN II-XII intact.  PSYCH: normal mood. Good eye contact     Assessment & Plan:   Problem List Items Addressed This Visit   None Visit Diagnoses     URI, acute    -  Primary   Relevant Medications   azithromycin (ZITHROMAX) 250 MG tablet      URI-zpk.  Symptomatic tx.  Worse, no change, let us know  Meds ordered this encounter  Medications   azithromycin (ZITHROMAX) 250 MG tablet    Sig: Take 2 tablets on day 1, then 1 tablet daily on days 2 through 5    Dispense:  6 tablet    Refill:  0    Wellington Hampshire, MD

## 2022-08-13 ENCOUNTER — Ambulatory Visit (INDEPENDENT_AMBULATORY_CARE_PROVIDER_SITE_OTHER): Payer: Medicare Other

## 2022-08-13 VITALS — Wt 194.0 lb

## 2022-08-13 DIAGNOSIS — Z0000 Encounter for general adult medical examination without abnormal findings: Secondary | ICD-10-CM

## 2022-08-13 DIAGNOSIS — Z1231 Encounter for screening mammogram for malignant neoplasm of breast: Secondary | ICD-10-CM

## 2022-08-13 NOTE — Patient Instructions (Signed)
[name] , Thank you for taking time to come for your Medicare Wellness Visit. I appreciate your ongoing commitment to your health goals. Please review the following plan we discussed and let me know if I can assist you in the future.   These are the goals we discussed:  Goals      Patient Stated     To spend time with Grandchildren!!!     Patient Stated     Continue to lose weight and get stronger      Patient Stated     Lose weight and get stronger         This is a list of the screening recommended for you and due dates:  Health Maintenance  Topic Date Due   Mammogram  07/31/2022   Zoster (Shingles) Vaccine (1 of 2) 10/09/2022*   COVID-19 Vaccine (6 - Pfizer risk series) 10/13/2022*   Flu Shot  01/24/2023*   DEXA scan (bone density measurement)  12/10/2022   Colon Cancer Screening  12/09/2025   Tetanus Vaccine  08/17/2028   Pneumonia Vaccine  Completed   Hepatitis C Screening: USPSTF Recommendation to screen - Ages 18-79 yo.  Completed   HPV Vaccine  Aged Out  *Topic was postponed. The date shown is not the original due date.    Advanced directives: Please bring a copy of your health care power of attorney and living will to the office at your convenience.  Conditions/risks identified: lose weight and get stronger   Next appointment: Follow up in one year for your annual wellness visit    Preventive Care 65 Years and Older, Female Preventive care refers to lifestyle choices and visits with your health care provider that can promote health and wellness. What does preventive care include? A yearly physical exam. This is also called an annual well check. Dental exams once or twice a year. Routine eye exams. Ask your health care provider how often you should have your eyes checked. Personal lifestyle choices, including: Daily care of your teeth and gums. Regular physical activity. Eating a healthy diet. Avoiding tobacco and drug use. Limiting alcohol  use. Practicing safe sex. Taking low-dose aspirin every day. Taking vitamin and mineral supplements as recommended by your health care provider. What happens during an annual well check? The services and screenings done by your health care provider during your annual well check will depend on your age, overall health, lifestyle risk factors, and family history of disease. Counseling  Your health care provider may ask you questions about your: Alcohol use. Tobacco use. Drug use. Emotional well-being. Home and relationship well-being. Sexual activity. Eating habits. History of falls. Memory and ability to understand (cognition). Work and work Statistician. Reproductive health. Screening  You may have the following tests or measurements: Height, weight, and BMI. Blood pressure. Lipid and cholesterol levels. These may be checked every 5 years, or more frequently if you are over 43 years old. Skin check. Lung cancer screening. You may have this screening every year starting at age 70 if you have a 30-pack-year history of smoking and currently smoke or have quit within the past 15 years. Fecal occult blood test (FOBT) of the stool. You may have this test every year starting at age 39. Flexible sigmoidoscopy or colonoscopy. You may have a sigmoidoscopy every 5 years or a colonoscopy every 10 years starting at age 68. Hepatitis C blood test. Hepatitis B blood test. Sexually transmitted disease (STD) testing. Diabetes screening. This is done by checking your  blood sugar (glucose) after you have not eaten for a while (fasting). You may have this done every 1-3 years. Bone density scan. This is done to screen for osteoporosis. You may have this done starting at age 43. Mammogram. This may be done every 1-2 years. Talk to your health care provider about how often you should have regular mammograms. Talk with your health care provider about your test results, treatment options, and if necessary,  the need for more tests. Vaccines  Your health care provider may recommend certain vaccines, such as: Influenza vaccine. This is recommended every year. Tetanus, diphtheria, and acellular pertussis (Tdap, Td) vaccine. You may need a Td booster every 10 years. Zoster vaccine. You may need this after age 30. Pneumococcal 13-valent conjugate (PCV13) vaccine. One dose is recommended after age 31. Pneumococcal polysaccharide (PPSV23) vaccine. One dose is recommended after age 93. Talk to your health care provider about which screenings and vaccines you need and how often you need them. This information is not intended to replace advice given to you by your health care provider. Make sure you discuss any questions you have with your health care provider. Document Released: 11/08/2015 Document Revised: 07/01/2016 Document Reviewed: 08/13/2015 Elsevier Interactive Patient Education  2017 [employer] Prevention in the Home Falls can cause injuries. They can happen to people of all ages. There are many things you can do to make your home safe and to help prevent falls. What can I do on the outside of my home? Regularly fix the edges of walkways and driveways and fix any cracks. Remove anything that might make you trip as you walk through a door, such as a raised step or threshold. Trim any bushes or trees on the path to your home. Use bright outdoor lighting. Clear any walking paths of anything that might make someone trip, such as rocks or tools. Regularly check to see if handrails are loose or broken. Make sure that both sides of any steps have handrails. Any raised decks and porches should have guardrails on the edges. Have any leaves, snow, or ice cleared regularly. Use sand or salt on walking paths during winter. Clean up any spills in your garage right away. This includes oil or grease spills. What can I do in the bathroom? Use night lights. Install grab bars by the toilet and in the  tub and shower. Do not use towel bars as grab bars. Use non-skid mats or decals in the tub or shower. If you need to sit down in the shower, use a plastic, non-slip stool. Keep the floor dry. Clean up any water that spills on the floor as soon as it happens. Remove soap buildup in the tub or shower regularly. Attach bath mats securely with double-sided non-slip rug tape. Do not have throw rugs and other things on the floor that can make you trip. What can I do in the bedroom? Use night lights. Make sure that you have a light by your bed that is easy to reach. Do not use any sheets or blankets that are too big for your bed. They should not hang down onto the floor. Have a firm chair that has side arms. You can use this for support while you get dressed. Do not have throw rugs and other things on the floor that can make you trip. What can I do in the kitchen? Clean up any spills right away. Avoid walking on wet floors. Keep items that you use a lot in  easy-to-reach places. If you need to reach something above you, use a strong step stool that has a grab bar. Keep electrical cords out of the way. Do not use floor polish or wax that makes floors slippery. If you must use wax, use non-skid floor wax. Do not have throw rugs and other things on the floor that can make you trip. What can I do with my stairs? Do not leave any items on the stairs. Make sure that there are handrails on both sides of the stairs and use them. Fix handrails that are broken or loose. Make sure that handrails are as long as the stairways. Check any carpeting to make sure that it is firmly attached to the stairs. Fix any carpet that is loose or worn. Avoid having throw rugs at the top or bottom of the stairs. If you do have throw rugs, attach them to the floor with carpet tape. Make sure that you have a light switch at the top of the stairs and the bottom of the stairs. If you do not have them, ask someone to add them for  you. What else can I do to help prevent falls? Wear shoes that: Do not have high heels. Have rubber bottoms. Are comfortable and fit you well. Are closed at the toe. Do not wear sandals. If you use a stepladder: Make sure that it is fully opened. Do not climb a closed stepladder. Make sure that both sides of the stepladder are locked into place. Ask someone to hold it for you, if possible. Clearly mark and make sure that you can see: Any grab bars or handrails. First and last steps. Where the edge of each step is. Use tools that help you move around (mobility aids) if they are needed. These include: Canes. Walkers. Scooters. Crutches. Turn on the lights when you go into a dark area. Replace any light bulbs as soon as they burn out. Set up your furniture so you have a clear path. Avoid moving your furniture around. If any of your floors are uneven, fix them. If there are any pets around you, be aware of where they are. Review your medicines with your doctor. Some medicines can make you feel dizzy. This can increase your chance of falling. Ask your doctor what other things that you can do to help prevent falls. This information is not intended to replace advice given to you by your health care provider. Make sure you discuss any questions you have with your health care provider. Document Released: 08/08/2009 Document Revised: 03/19/2016 Document Reviewed: 11/16/2014 Elsevier Interactive Patient Education  2017 [employer].

## 2022-08-13 NOTE — Progress Notes (Signed)
I connected with  Summer [name] on 08/13/22 by a audio enabled telemedicine application and verified that I am speaking with the correct person using two identifiers.  Patient Location: Home  Provider Location: Office/Clinic  I discussed the limitations of evaluation and management by telemedicine. The patient expressed understanding and agreed to proceed.   Subjective:   Summer [name] is a 71 y.o. female who presents for Medicare Annual (Subsequent) preventive examination.  Review of Systems     Cardiac Risk Factors include: advanced age (>21mn, >>48women)     Objective:    Today's Vitals   08/13/22 1402  Weight: 194 lb (88 kg)   Body mass index is 31.31 kg/m.     08/13/2022    2:07 PM 07/28/2021    8:22 AM 09/14/2019   10:16 AM 09/08/2018    3:30 PM 11/04/2017    1:27 PM 09/07/2017    2:15 PM 09/07/2017    9:26 AM  Advanced Directives  Does Patient Have a Medical Advance Directive? Yes Yes Yes Yes Yes Yes Yes  Type of AParamedicof APantegoLiving will HSt. IgnaceLiving will Living will;Healthcare Power of ACygnet Does patient want to make changes to medical advance directive?   No - Patient declined   No - Patient declined No - Patient declined  Copy of [place] Chart? No - copy requested No - copy requested No - copy requested  Yes No - copy requested No - copy requested    Current Medications (verified) Outpatient Encounter Medications as of 08/13/2022  Medication Sig   albuterol (VENTOLIN HFA) 108 (90 Base) MCG/ACT inhaler Inhale 2 puffs into the lungs every 6 (six) hours as needed for wheezing or shortness of breath.   Ascorbic Acid (VITAMIN C) 100 MG tablet Take 100 mg by mouth daily.   aspirin EC 81 MG tablet Take 81 mg by mouth daily. Swallow whole.   azithromycin (ZITHROMAX)  250 MG tablet Take 2 tablets on day 1, then 1 tablet daily on days 2 through 5   Calcium-Vitamin D-Vitamin K 863 609 5857-90 MG-UNT-MCG TABS Take 1 tablet by mouth 2 (two) times daily. Also includes Vitamin C   Cholecalciferol 100 MCG (4000 UT) CAPS Take 4,000 Units by mouth daily.   denosumab (PROLIA) 60 MG/ML SOLN injection Inject 60 mg into the skin every 6 (six) months. Administer in upper arm, thigh, or abdomen   EPINEPHrine 0.3 mg/0.3 mL IJ SOAJ injection Inject 0.3 mLs (0.3 mg total) into the muscle as needed for anaphylaxis.   FEROSUL 325 (65 Fe) MG tablet TAKE ONE TABLET BY MOUTH DAILY WITH VITAMIN C.   lamoTRIgine (LAMICTAL) 150 MG tablet Take 1 tablet (150 mg total) by mouth 2 (two) times daily.   levETIRAcetam (KEPPRA) 500 MG tablet Take 1 tablet twice daily   nystatin cream (MYCOSTATIN) Apply 1 Application topically 2 (two) times daily.   rosuvastatin (CRESTOR) 20 MG tablet Take 1 tablet (20 mg total) by mouth daily.   vitamin E 600 UNIT capsule Take 600 Units by mouth daily.   Acetaminophen (TYLENOL PO) Take by mouth as needed. (Patient not taking: Reported on 08/13/2022)   No facility-administered encounter medications on file as of 08/13/2022.    Allergies (verified) Bee venom   History: Past Medical History:  Diagnosis Date   Adenomatous polyp of sigmoid colon 03/18/2017   Overview:  Added automatically from request for surgery [id number]   Alcoholism ([place])    01/18/18 - no use in 5 years   Asymptomatic varicose veins 01/11/2017   Cancer ([place])    skin ca   Iron deficiency anemia 01/12/2017   Last Assessment & Plan:  Given script for ferrous sulfate and order for repeat cbc with diff and iron studies to be done in 1 month Will also refer pt back to [facility] for repeat colonoscopy and to dietician for nutritional counseling, per pt request   Lentigines 01/11/2017   Melanocytic nevi of left lower limb, including hip 01/11/2017   Melanocytic nevi of left upper limb, including shoulder  01/11/2017   Melanocytic nevi of right lower limb, including hip 01/11/2017   Melanocytic nevi of right upper limb, including shoulder 01/11/2017   Melanocytic nevi of trunk 01/11/2017   Obesity (BMI 30-39.9) 05/21/2017   Osteoporosis    Plantar fasciitis    Primary osteoarthritis, right shoulder 12/11/2016   Seizure ([place])    LAST SEIZURE- 2016 per pt   Sleep apnea    no cpap per pt   Unilateral primary osteoarthritis, left knee 12/11/2016   Past Surgical History:  Procedure Laterality Date   BARIATRIC SURGERY     COLONOSCOPY  03/18/2017   in [place].   ECTOPIC PREGNANCY SURGERY     POLYPECTOMY     SIGMOIDOSCOPY     in [place].   TOTAL KNEE ARTHROPLASTY Left 09/07/2017   Procedure: LEFT TOTAL KNEE ARTHROPLASTY;  Surgeon: Paralee Cancel, MD;  Location: [facility];  Service: Orthopedics;  Laterality: Left;  Adductor Block   Family History  Problem Relation Age of Onset   Mitral valve prolapse Mother    Osteoporosis Mother    Memory loss Mother    Colon cancer Father 69   Alcoholism Father    Colon polyps Neg Hx    Esophageal cancer Neg Hx    Rectal cancer Neg Hx    Stomach cancer Neg Hx    Social History   Socioeconomic History   Marital status: Married    Spouse name: Not on file   Number of children: 1   Years of education: college   Highest education level: Bachelor's degree (e.g., BA, AB, BS)  Occupational History   Occupation: Retired  Tobacco Use   Smoking status: Never   Smokeless tobacco: Never  Vaping Use   Vaping Use: Never used  Substance and Sexual Activity   Alcohol use: No   Drug use: No   Sexual activity: Never  Other Topics Concern   Not on file  Social History Narrative   Lives at home with husband.   Right-handed.   3 cups caffeine per day.   Cares for 2 grandchildren 3 days per week    Social Determinants of Health   Financial Resource Strain: Low Risk  (08/13/2022)   Overall Financial Resource Strain (CARDIA)    Difficulty of Paying Living  Expenses: Not hard at all  Food Insecurity: No Food Insecurity (08/13/2022)   Hunger Vital Sign    Worried About Running Out of Food in the Last Year: Never true    Ran Out of Food in the Last Year: Never true  Transportation Needs: No Transportation Needs (08/13/2022)   PRAPARE - Hydrologist (Medical): No    Lack of Transportation (Non-Medical): No  Physical Activity: Sufficiently Active (08/13/2022)   Exercise Vital Sign    Days of Exercise per Week: 4 days  Minutes of Exercise per Session: 50 min  Stress: No Stress Concern Present (08/13/2022)   [place]    Feeling of Stress : Not at all  Social Connections: Moderately Integrated (08/13/2022)   Social Connection and Isolation Panel [NHANES]    Frequency of Communication with Friends and Family: More than three times a week    Frequency of Social Gatherings with Friends and Family: More than three times a week    Attends Religious Services: Never    Marine scientist or Organizations: Yes    Attends Music therapist: 1 to 4 times per year    Marital Status: Married    Tobacco Counseling Counseling given: Not Answered   Clinical Intake:  Pre-visit preparation completed: Yes  Pain : No/denies pain     BMI - recorded: 31.31 Nutritional Status: BMI > 30  Obese Nutritional Risks: None Diabetes: No  How often do you need to have someone help you when you read instructions, pamphlets, or other written materials from your doctor or pharmacy?: 1 - Never  Diabetic?no  Interpreter Needed?: No  Information entered by :: Charlott Rakes, LPN   Activities of Daily Living    08/13/2022    2:11 PM  In your present state of health, do you have any difficulty performing the following activities:  Hearing? 0  Vision? 0  Difficulty concentrating or making decisions? 0  Walking or climbing stairs? 0  Dressing  or bathing? 0  Doing errands, shopping? 0  Preparing Food and eating ? N  Using the Toilet? N  In the past six months, have you accidently leaked urine? N  Do you have problems with loss of bowel control? N  Managing your Medications? N  Managing your Finances? N  Housekeeping or managing your Housekeeping? N    Patient Care Team: Allwardt, Randa Evens, PA-C as PCP - General (Physician Assistant) [facility] as Consulting Physician (Dermatology) Warden Fillers, MD as Consulting Physician (Ophthalmology) Orth[facility], [facility] (Specialist)  Indicate any recent Medical Services you may have received from other than [facility] providers in the past year (date may be approximate).     Assessment:   This is a routine wellness examination for [facility].  Hearing/Vision screen Hearing Screening - Comments:: Pt denies any hearing issues  Vision Screening - Comments:: Pt follows up with Dr Katy Fitch for annual eye exams   Dietary issues and exercise activities discussed: Current Exercise Habits: Home exercise routine, Type of exercise: Other - see comments;walking, Time (Minutes): 50, Frequency (Times/Week): 4, Weekly Exercise (Minutes/Week): 200   Goals Addressed             This Visit's Progress    Patient Stated       Lose weight and get stronger        Depression Screen    08/13/2022    2:08 PM 08/11/2022    1:37 PM 07/28/2021    8:18 AM 08/05/2020   11:35 AM 03/29/2020    8:54 AM 09/14/2019   10:17 AM 09/08/2018    3:31 PM  PHQ 2/9 Scores  PHQ - 2 Score 0 0 1 0 0 0 0    Fall Risk    08/13/2022    2:11 PM 07/10/2022    1:32 PM 07/28/2021    8:23 AM 08/05/2020   11:35 AM 09/14/2019   10:17 AM  Fall Risk   Falls in the past year? 0 0 0 0 0  Number falls in past yr: 0 0 0    Injury with Fall? 0 0 0  0  Risk for fall due to : No Fall Risks;Impaired vision No Fall Risks Impaired vision No Fall Risks   Follow up Falls prevention discussed Falls evaluation completed Falls  prevention discussed  Falls evaluation completed;Education provided;Falls prevention discussed    FALL RISK PREVENTION PERTAINING TO THE HOME:  Any stairs in or around the home? Yes  If so, are there any without handrails? No  Home free of loose throw rugs in walkways, pet beds, electrical cords, etc? Yes  Adequate lighting in your home to reduce risk of falls? Yes   ASSISTIVE DEVICES UTILIZED TO PREVENT FALLS:  Life alert? No  Use of a cane, walker or w/c? No  Grab bars in the bathroom? Yes  Shower chair or bench in shower? No  Elevated toilet seat or a handicapped toilet? No   TIMED UP AND GO:  Was the test performed? No .   Cognitive Function:Pt declined to complete pt was Knowledgeable to questions, alert and oriented         07/28/2021    8:25 AM  6CIT Screen  What Year? 0 points  What month? 0 points  What time? 0 points  Count back from 20 0 points  Months in reverse 0 points  Repeat phrase 0 points  Total Score 0 points    Immunizations Immunization History  Administered Date(s) Administered   Fluad Quad(high Dose 65+) 07/21/2019   H1N1 09/12/2008   Influenza Split 06/28/2011, 10/29/2012   Influenza, High Dose Seasonal PF 01/01/2015, 09/07/2016, 08/02/2018, 07/26/2020   Influenza, Quadrivalent, Recombinant, Inj, Pf 01/01/2015, 09/07/2016   Influenza,inj,Quad PF,6+ Mos 01/01/2015, 09/07/2016   Influenza,trivalent, recombinat, inj, PF 06/28/2011, 10/29/2012   Influenza-Unspecified 01/01/2015, 09/07/2016, 08/30/2017   PFIZER(Purple Top)SARS-COV-2 Vaccination 12/04/2019, 12/29/2019, 07/31/2020, 01/29/2021   Pfizer Covid-19 Vaccine Bivalent Booster 47yr & up 09/15/2021   Pneumococcal Conjugate-13 10/01/2016, 08/23/2018   Pneumococcal Polysaccharide-23 12/20/2013, 09/14/2019, 05/17/2021   Respiratory Syncytial Virus Vaccine,Recomb Aduvanted(Arexvy) 07/28/2022   Tdap 01/27/2012, 09/08/2016, 08/17/2018    TDAP status: Up to date  Flu Vaccine status: Due,  Education has been provided regarding the importance of this vaccine. Advised may receive this vaccine at local pharmacy or Health Dept. Aware to provide a copy of the vaccination record if obtained from local pharmacy or Health Dept. Verbalized acceptance and understanding.  Pneumococcal vaccine status: Up to date  Covid-19 vaccine status: Completed vaccines  Qualifies for Shingles Vaccine? Yes   Zostavax completed No   Shingrix Completed?: No.    Education has been provided regarding the importance of this vaccine. Patient has been advised to call insurance company to determine out of pocket expense if they have not yet received this vaccine. Advised may also receive vaccine at local pharmacy or Health Dept. Verbalized acceptance and understanding.  Screening Tests Health Maintenance  Topic Date Due   MAMMOGRAM  07/31/2022   Zoster Vaccines- Shingrix (1 of 2) 10/09/2022 (Originally 09/21/1970)   COVID-19 Vaccine (6 - Pfizer risk series) 10/13/2022 (Originally 11/10/2021)   INFLUENZA VACCINE  01/24/2023 (Originally 05/26/2022)   DEXA SCAN  12/10/2022   COLONOSCOPY (Pts 45-42yrInsurance coverage will need to be confirmed)  12/09/2025   TETANUS/TDAP  08/17/2028   Pneumonia Vaccine 6581Years old  Completed   Hepatitis C Screening  Completed   HPV VACCINES  Aged Out    Health Maintenance  Health Maintenance Due  Topic Date Due  MAMMOGRAM  07/31/2022    Colorectal cancer screening: Type of screening: Colonoscopy. Completed 12/09/20. Repeat every 5 years  Mammogram status: Ordered 08/13/22. Pt provided with contact info and advised to call to schedule appt.   Bone Density status: Completed 12/10/21. Results reflect: Bone density results: OSTEOPOROSIS. Repeat every 1 years.   Additional Screening:  Hepatitis C Screening:  Completed 04/13/18  Vision Screening: Recommended annual ophthalmology exams for early detection of glaucoma and other disorders of the eye. Is the patient up to  date with their annual eye exam?  Yes  Who is the provider or what is the name of the office in which the patient attends annual eye exams? Dr Katy Fitch  If pt is not established with a provider, would they like to be referred to a provider to establish care? No .   Dental Screening: Recommended annual dental exams for proper oral hygiene  Community Resource Referral / Chronic Care Management: CRR required this visit?  No   CCM required this visit?  No      Plan:     I have personally reviewed and noted the following in the patient's chart:   Medical and social history Use of alcohol, tobacco or illicit drugs  Current medications and supplements including opioid prescriptions. Patient is not currently taking opioid prescriptions. Functional ability and status Nutritional status Physical activity Advanced directives List of other physicians Hospitalizations, surgeries, and ER visits in previous 12 months Vitals Screenings to include cognitive, depression, and falls Referrals and appointments  In addition, I have reviewed and discussed with patient certain preventive protocols, quality metrics, and best practice recommendations. A written personalized care plan for preventive services as well as general preventive health recommendations were provided to patient.     Willette Brace, LPN   [id number]   Nurse Notes: None

## 2022-08-14 ENCOUNTER — Encounter: Payer: Self-pay | Admitting: Physician Assistant

## 2022-08-14 ENCOUNTER — Other Ambulatory Visit: Payer: Self-pay

## 2022-08-14 DIAGNOSIS — N898 Other specified noninflammatory disorders of vagina: Secondary | ICD-10-CM

## 2022-08-14 DIAGNOSIS — R238 Other skin changes: Secondary | ICD-10-CM

## 2022-08-14 NOTE — Telephone Encounter (Signed)
Referral to GYN placed and pt advised via [place]

## 2022-08-14 NOTE — Telephone Encounter (Signed)
Please see pt message and advise 

## 2022-09-04 ENCOUNTER — Other Ambulatory Visit (HOSPITAL_BASED_OUTPATIENT_CLINIC_OR_DEPARTMENT_OTHER): Payer: Self-pay

## 2022-09-04 MED ORDER — COMIRNATY 30 MCG/0.3ML IM SUSY
PREFILLED_SYRINGE | INTRAMUSCULAR | 0 refills | Status: DC
  Filled 2022-09-04: qty 0.3, 1d supply, fill #0

## 2022-09-07 ENCOUNTER — Encounter: Payer: Self-pay | Admitting: Obstetrics and Gynecology

## 2022-09-07 ENCOUNTER — Ambulatory Visit (INDEPENDENT_AMBULATORY_CARE_PROVIDER_SITE_OTHER): Payer: Medicare Other | Admitting: Obstetrics and Gynecology

## 2022-09-07 VITALS — BP 120/78 | HR 72 | Ht 65.5 in | Wt 199.0 lb

## 2022-09-07 DIAGNOSIS — N952 Postmenopausal atrophic vaginitis: Secondary | ICD-10-CM | POA: Diagnosis not present

## 2022-09-07 DIAGNOSIS — N898 Other specified noninflammatory disorders of vagina: Secondary | ICD-10-CM | POA: Diagnosis not present

## 2022-09-07 DIAGNOSIS — N763 Subacute and chronic vulvitis: Secondary | ICD-10-CM

## 2022-09-07 LAB — URINALYSIS, COMPLETE W/RFL CULTURE
Bacteria, UA: NONE SEEN /HPF
Bilirubin Urine: NEGATIVE
Glucose, UA: NEGATIVE
Hgb urine dipstick: NEGATIVE
Hyaline Cast: NONE SEEN /LPF
Leukocyte Esterase: NEGATIVE
Nitrites, Initial: NEGATIVE
Protein, ur: NEGATIVE
RBC / HPF: NONE SEEN /HPF (ref 0–2)
Specific Gravity, Urine: 1.02 (ref 1.001–1.035)
WBC, UA: NONE SEEN /HPF (ref 0–5)
pH: 6.5 (ref 5.0–8.0)

## 2022-09-07 LAB — NO CULTURE INDICATED

## 2022-09-07 LAB — WET PREP FOR TRICH, YEAST, CLUE

## 2022-09-07 MED ORDER — TRIAMCINOLONE ACETONIDE 0.025 % EX OINT: 1.0000 | TOPICAL_OINTMENT | Freq: Two times a day (BID) | CUTANEOUS | 0 refills | Status: DC

## 2022-09-07 NOTE — Patient Instructions (Addendum)
Atrophic Vaginitis  Atrophic vaginitis is a condition in which the tissues that line the vagina become dry and thin. This condition is most common in women who have stopped having regular menstrual periods (are in menopause). This usually starts when a woman is 45 to 71 years old. That is the time when a woman's estrogen levels begin to decrease. Estrogen is a female hormone. It helps to keep the tissues of the vagina moist. It stimulates the vagina to produce a clear fluid that lubricates the vagina for sex. This fluid also protects the vagina from infection. Lack of estrogen can cause the lining of the vagina to get thinner and dryer. The vagina may also shrink in size. It may become less elastic. Atrophic vaginitis tends to get worse over time as a woman's estrogen level drops. What are the causes? This condition is caused by the normal drop in estrogen that happens around the time of menopause. What increases the risk? Certain conditions or situations may lower a woman's estrogen level, leading to a higher risk for atrophic vaginitis. You are more likely to develop this condition if: You are taking medicines that block estrogen. You have had your ovaries removed. You are being treated for cancer with radiation or medicines (chemotherapy). You have given birth or are breastfeeding. You are older than age 50. You smoke. What are the signs or symptoms? Symptoms of this condition include: Pain, soreness, a feeling of pressure, or bleeding during sex (dyspareunia). Vaginal burning, irritation, or itching. Pain or bleeding when a speculum is used in a vaginal exam. Having burning pain while urinating. Vaginal discharge. In some cases, there are no symptoms. How is this diagnosed? This condition is diagnosed based on your medical history and a physical exam. This will include a pelvic exam that checks the vaginal tissues. Though rare, you may also have other tests, including: A urine test. A  test that checks the acid balance in your vagina (acid balance test). How is this treated? Treatment for this condition depends on how severe your symptoms are. Treatment may include: Using an over-the-counter vaginal lubricant before sex. Using a long-acting vaginal moisturizer. Using low-dose estrogen for moderate to severe symptoms that do not respond to other treatments. Options include creams, tablets, and inserts (vaginal rings). Before you use a vaginal estrogen, tell your health care provider if you have a history of: Breast cancer. Endometrial cancer. Blood clots. If you are not sexually active and your symptoms are very mild, you may not need treatment. Follow these instructions at home: Medicines Take over-the-counter and prescription medicines only as told by your health care provider. Do not use herbal or alternative medicines unless your health care provider says that you can. Use over-the-counter creams, lubricants, or moisturizers for dryness only as told by your health care provider. General instructions If your atrophic vaginitis is caused by menopause, discuss all of your menopause symptoms and treatment options with your health care provider. Do not douche. Do not use products that can make your vagina dry. These include: Scented feminine sprays. Scented tampons. Scented soaps. Vaginal sex can help to improve blood flow and elasticity of vaginal tissue. If you choose to have sex and it hurts, try using a water-soluble lubricant or moisturizer right before having sex. Contact a health care provider if: Your discharge looks different than normal. Your vagina has an unusual smell. You have new symptoms. Your symptoms do not improve with treatment. Your symptoms get worse. Summary Atrophic vaginitis is a condition   in which the tissues that line the vagina become dry and thin. It is most common in women who have stopped having regular menstrual periods (are in  menopause). Treatment options include using vaginal lubricants and low-dose vaginal estrogen. Contact a health care provider if your vagina has an unusual smell, or if your symptoms get worse or do not improve after treatment. This information is not intended to replace advice given to you by your health care provider. Make sure you discuss any questions you have with your health care provider. Document Revised: 04/11/2020 Document Reviewed: 04/11/2020 Elsevier Patient Education  [place].   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  Vulvitis You will learn what kinds of treatment you can use to try to clear up this inflammation and its symptoms. To view the content, go to this web address: [email or web address]  This video will expire on: 06/30/2024. If you need access to this video following this date, please reach out to the healthcare provider who assigned it to you. This information is not intended to replace advice given to you by your health care provider. Make sure you discuss any questions you have with your health care provider. Elsevier Patient Education  [place].

## 2022-09-07 NOTE — Progress Notes (Unsigned)
GYNECOLOGY  VISIT   HPI: 71 y.o.   Married  Caucasian  female   No obstetric history on file. with No LMP recorded. Patient is postmenopausal.   here for vaginal irritation, vaginal itching.  Odor and irritation along inner thighs.  She uses an antibacterial soap helps the irritation.  Is using safeguard soap and it is making her skin dry.   She has used Monistat, and this did not help.   She has changed her detergents to free and clear.   Doing recumbant bike at [employer].  Exercises and wears more tight fitting pants.   No diabetes or prediabetes.   No generalized itching.   She denies hx of eczema, psoriasis, and lichen sclerosis.   No dx diabetes.   Seen by cardiology for elevated calcium cardiac score.   GYNECOLOGIC HISTORY: No LMP recorded. Patient is postmenopausal. Contraception:  post menopausal Menopausal hormone therapy:  n/a Last mammogram:  07/31/2020 BI-RADS CATEGORY 1 NEGATIVE.  Scheduled for this week.  Last pap smear:   11/28/20 HPV neg, normal.        OB History     Gravida  1   Para      Term      Preterm      AB  1   Living  1      SAB  0   IAB      Ectopic  1   Multiple      Live Births                 Patient Active Problem List   Diagnosis Date Noted   Mixed hyperlipidemia 07/07/2019   Vitamin D deficiency 07/07/2019   Partial symptomatic epilepsy with complex partial seizures, not intractable, without status epilepticus ([place]) 01/18/2018   Facial paresthesia 01/18/2018   S/P total knee replacement 09/07/2017   Obesity (BMI 30-39.9) 05/21/2017   Adenomatous polyp of sigmoid colon 03/18/2017   Family history of colon cancer in father 02/23/2017   Iron deficiency anemia 01/12/2017   Neck pain 01/11/2017   Osteoporosis 01/11/2017   Lentigines 01/11/2017   Primary osteoarthritis, right shoulder 12/11/2016    Past Medical History:  Diagnosis Date   Adenomatous polyp of sigmoid colon 03/18/2017   Overview:  Added  automatically from request for surgery [id number]   Alcoholism ([place])    01/18/18 - no use in 5 years   Asymptomatic varicose veins 01/11/2017   Cancer ([place])    skin ca   Iron deficiency anemia 01/12/2017   Last Assessment & Plan:  Given script for ferrous sulfate and order for repeat cbc with diff and iron studies to be done in 1 month Will also refer pt back to [facility] for repeat colonoscopy and to dietician for nutritional counseling, per pt request   Lentigines 01/11/2017   Melanocytic nevi of left lower limb, including hip 01/11/2017   Melanocytic nevi of left upper limb, including shoulder 01/11/2017   Melanocytic nevi of right lower limb, including hip 01/11/2017   Melanocytic nevi of right upper limb, including shoulder 01/11/2017   Melanocytic nevi of trunk 01/11/2017   Obesity (BMI 30-39.9) 05/21/2017   Osteoporosis    Plantar fasciitis    Primary osteoarthritis, right shoulder 12/11/2016   Seizure ([place])    LAST SEIZURE- 2016 per pt   Sleep apnea    no cpap per pt   Unilateral primary osteoarthritis, left knee 12/11/2016    Past Surgical History:  Procedure Laterality Date   BARIATRIC  SURGERY     COLONOSCOPY  03/18/2017   in [facility].   ECTOPIC PREGNANCY SURGERY     POLYPECTOMY     SIGMOIDOSCOPY     in [facility].   TOTAL KNEE ARTHROPLASTY Left 09/07/2017   Procedure: LEFT TOTAL KNEE ARTHROPLASTY;  Surgeon: Paralee Cancel, MD;  Location: [facility];  Service: Orthopedics;  Laterality: Left;  Adductor Block    Current Outpatient Medications  Medication Sig Dispense Refill   Ascorbic Acid (VITAMIN C) 100 MG tablet Take 100 mg by mouth daily.     aspirin EC 81 MG tablet Take 81 mg by mouth daily. Swallow whole.     Calcium-Vitamin D-Vitamin K 888-9169-45 MG-UNT-MCG TABS Take 1 tablet by mouth 2 (two) times daily. Also includes Vitamin C     Cholecalciferol 100 MCG (4000 UT) CAPS Take 4,000 Units by mouth daily.     COVID-19 mRNA vaccine 2023-2024 (COMIRNATY) syringe Inject into the muscle. 0.3 mL 0    denosumab (PROLIA) 60 MG/ML SOLN injection Inject 60 mg into the skin every 6 (six) months. Administer in upper arm, thigh, or abdomen     EPINEPHrine 0.3 mg/0.3 mL IJ SOAJ injection Inject 0.3 mLs (0.3 mg total) into the muscle as needed for anaphylaxis. 2 each 1   FEROSUL 325 (65 Fe) MG tablet TAKE ONE TABLET BY MOUTH DAILY WITH VITAMIN C. 30 tablet 2   lamoTRIgine (LAMICTAL) 150 MG tablet Take 1 tablet (150 mg total) by mouth 2 (two) times daily. 180 tablet 4   levETIRAcetam (KEPPRA) 500 MG tablet Take 1 tablet twice daily 180 tablet 4   rosuvastatin (CRESTOR) 20 MG tablet Take 1 tablet (20 mg total) by mouth daily. 90 tablet 3   vitamin E 600 UNIT capsule Take 600 Units by mouth daily.     Acetaminophen (TYLENOL PO) Take by mouth as needed. (Patient not taking: Reported on 08/13/2022)     albuterol (VENTOLIN HFA) 108 (90 Base) MCG/ACT inhaler Inhale 2 puffs into the lungs every 6 (six) hours as needed for wheezing or shortness of breath. 8 g 0   nystatin cream (MYCOSTATIN) Apply 1 Application topically 2 (two) times daily. (Patient not taking: Reported on 09/07/2022) 30 g 0   No current facility-administered medications for this visit.     ALLERGIES: Bee venom  Family History  Problem Relation Age of Onset   Mitral valve prolapse Mother    Osteoporosis Mother    Memory loss Mother    Colon cancer Father 65   Alcoholism Father    Colon polyps Neg Hx    Esophageal cancer Neg Hx    Rectal cancer Neg Hx    Stomach cancer Neg Hx     Social History   Socioeconomic History   Marital status: Married    Spouse name: Not on file   Number of children: 1   Years of education: college   Highest education level: Bachelor's degree (e.g., BA, AB, BS)  Occupational History   Occupation: Retired  Tobacco Use   Smoking status: Never   Smokeless tobacco: Never  Vaping Use   Vaping Use: Never used  Substance and Sexual Activity   Alcohol use: No   Drug use: No   Sexual activity: Never     Birth control/protection: Post-menopausal  Other Topics Concern   Not on file  Social History Narrative   Lives at home with husband.   Right-handed.   3 cups caffeine per day.   Cares for 2 grandchildren 3 days  per week    Social Determinants of Health   Financial Resource Strain: Low Risk  (08/13/2022)   Overall Financial Resource Strain (CARDIA)    Difficulty of Paying Living Expenses: Not hard at all  Food Insecurity: No Food Insecurity (08/13/2022)   Hunger Vital Sign    Worried About Running Out of Food in the Last Year: Never true    Ran Out of Food in the Last Year: Never true  Transportation Needs: No Transportation Needs (08/13/2022)   PRAPARE - Hydrologist (Medical): No    Lack of Transportation (Non-Medical): No  Physical Activity: Sufficiently Active (08/13/2022)   Exercise Vital Sign    Days of Exercise per Week: 4 days    Minutes of Exercise per Session: 50 min  Stress: No Stress Concern Present (08/13/2022)   [place]    Feeling of Stress : Not at all  Social Connections: Moderately Integrated (08/13/2022)   Social Connection and Isolation Panel [NHANES]    Frequency of Communication with Friends and Family: More than three times a week    Frequency of Social Gatherings with Friends and Family: More than three times a week    Attends Religious Services: Never    Marine scientist or Organizations: Yes    Attends Archivist Meetings: 1 to 4 times per year    Marital Status: Married  Human resources officer Violence: Not At Risk (08/13/2022)   Humiliation, Afraid, Rape, and Kick questionnaire    Fear of Current or Ex-Partner: No    Emotionally Abused: No    Physically Abused: No    Sexually Abused: No    Review of Systems  See HPI.   PHYSICAL EXAMINATION:    BP 120/78 (BP Location: Right Arm, Patient Position: Sitting, Cuff Size: Normal)   Pulse  72   Ht 5' 5.5" (1.664 m)   Wt 199 lb (90.3 kg)   BMI 32.61 kg/m     General appearance: alert, cooperative and appears stated age Head: Normocephalic, without obvious abnormality, atraumatic Neck: no adenopathy, supple, symmetrical, trachea midline and thyroid normal to inspection and palpation Lungs: clear to auscultation bilaterally Breasts: normal appearance, no masses or tenderness, No nipple retraction or dimpling, No nipple discharge or bleeding, No axillary or supraclavicular adenopathy Heart: regular rate and rhythm Abdomen: soft, non-tender, no masses,  no organomegaly Extremities: extremities normal, atraumatic, no cyanosis or edema Skin: Skin color, texture, turgor normal. No rashes or lesions Lymph nodes: Cervical, supraclavicular, and axillary nodes normal. No abnormal inguinal nodes palpated Neurologic: Grossly normal  Pelvic: External genitalia:  cherry hemangioma.               Urethra:  normal appearing urethra with no masses, tenderness or lesions              Bartholins and Skenes: normal                 Vagina: atrophy noted.               Cervix: no lesions                Bimanual Exam:  Uterus:  normal size, contour, position, consistency, mobility, non-tender              Adnexa: no mass, fullness, tenderness            Chaperone was present for exam:  Kimalexis.  ASSESSMENT  Vaginal irritation.  Vaginal atrophy. Vulvitis, chronic.    PLAN  Wet prep Urinalysis negative.  No UC sent.   Update mammogram. Return for vulvar biopsy.  An After Visit Summary was printed and given to the patient.  ______ minutes face to face time of which over 50% was spent in counseling.

## 2022-09-09 NOTE — Progress Notes (Signed)
Name: Summer [name]  MRN/ DOB: [id number], 07-31-1951    Age/ Sex: 71 y.o., female    PCP: Allwardt, Randa Evens, PA-C   Reason for Endocrinology Evaluation: Osteoporosis      Date of Initial Endocrinology Evaluation: 09/09/2022     HPI: [name] is a 71 y.o. female with a past medical history of Dyslipidemia, seizure d/o and osteopenia , S/P gastric bypass 2008, recovering alcoholic . The patient presented for initial endocrinology clinic visit on 09/09/2022 for consultative assistance with her Osteoporosis  Pt was diagnosed with Osteoporosis : unknown   Menarche at age : 20 Menopausal at age : 19's. Fracture Hx: none as an adult  Hx of HRT: no FH of osteoporosis or hip fracture: mother and sister  Prior Hx of anti-resorptive therapy : Has been on Prolia : not sure but it has appeared on her med list in 2018.  She did not receive her first injection of Prolia through our system until February 2019  Vitamin D3 2000- Calcium 100  BID Calcium 60 mg  daily part of vitamin B12  Calcium carbonate cap 250 mg daily     HISTORY:  Past Medical History:  Past Medical History:  Diagnosis Date   Adenomatous polyp of sigmoid colon 03/18/2017   Overview:  Added automatically from request for surgery [phone]   Alcoholism ([place])    01/18/18 - no use in 5 years   Asymptomatic varicose veins 01/11/2017   Cancer ([place])    skin ca   Iron deficiency anemia 01/12/2017   Last Assessment & Plan:  Given script for ferrous sulfate and order for repeat cbc with diff and iron studies to be done in 1 month Will also refer pt back to [facility]S for repeat colonoscopy and to dietician for nutritional counseling, per pt request   Lentigines 01/11/2017   Melanocytic nevi of left lower limb, including hip 01/11/2017   Melanocytic nevi of left upper limb, including shoulder 01/11/2017   Melanocytic nevi of right lower limb, including hip 01/11/2017   Melanocytic nevi of right upper limb, including  shoulder 01/11/2017   Melanocytic nevi of trunk 01/11/2017   Obesity (BMI 30-39.9) 05/21/2017   Osteoporosis    Plantar fasciitis    Primary osteoarthritis, right shoulder 12/11/2016   Seizure ([place])    LAST SEIZURE- 2016 per pt   Sleep apnea    no cpap per pt   Unilateral primary osteoarthritis, left knee 12/11/2016   Past Surgical History:  Past Surgical History:  Procedure Laterality Date   BARIATRIC SURGERY     COLONOSCOPY  03/18/2017   in [facility].   ECTOPIC PREGNANCY SURGERY     POLYPECTOMY     SIGMOIDOSCOPY     in [facility].   TOTAL KNEE ARTHROPLASTY Left 09/07/2017   Procedure: LEFT TOTAL KNEE ARTHROPLASTY;  Surgeon: Paralee Cancel, MD;  Location: [facility];  Service: Orthopedics;  Laterality: Left;  Adductor Block    Social History:  reports that she has never smoked. She has never used smokeless tobacco. She reports that she does not drink alcohol and does not use drugs. Family History: family history includes Alcoholism in her father; Colon cancer (age of onset: 87) in her father; Memory loss in her mother; Mitral valve prolapse in her mother; Osteoporosis in her mother.   HOME MEDICATIONS: Allergies as of 09/10/2022       Reactions   Bee Venom Swelling        Medication List  Accurate as of September 09, 2022 10:31 AM. If you have any questions, ask your nurse or doctor.          albuterol 108 (90 Base) MCG/ACT inhaler Commonly known as: VENTOLIN HFA Inhale 2 puffs into the lungs every 6 (six) hours as needed for wheezing or shortness of breath.   aspirin EC 81 MG tablet Take 81 mg by mouth daily. Swallow whole.   Calcium-Vitamin D-Vitamin K 937-1696-78 MG-UNT-MCG Tabs Take 1 tablet by mouth 2 (two) times daily. Also includes Vitamin C   Cholecalciferol 100 MCG (4000 UT) Caps Take 4,000 Units by mouth daily.   Comirnaty syringe Generic drug: COVID-19 mRNA vaccine 2023-2024 Inject into the muscle.   denosumab 60 MG/ML Soln injection Commonly known as:  PROLIA Inject 60 mg into the skin every 6 (six) months. Administer in upper arm, thigh, or abdomen   EPINEPHrine 0.3 mg/0.3 mL Soaj injection Commonly known as: EPI-PEN Inject 0.3 mLs (0.3 mg total) into the muscle as needed for anaphylaxis.   FeroSul 325 (65 FE) MG tablet Generic drug: ferrous sulfate TAKE ONE TABLET BY MOUTH DAILY WITH VITAMIN C.   lamoTRIgine 150 MG tablet Commonly known as: LAMICTAL Take 1 tablet (150 mg total) by mouth 2 (two) times daily.   levETIRAcetam 500 MG tablet Commonly known as: KEPPRA Take 1 tablet twice daily   nystatin cream Commonly known as: MYCOSTATIN Apply 1 Application topically 2 (two) times daily.   rosuvastatin 20 MG tablet Commonly known as: CRESTOR Take 1 tablet (20 mg total) by mouth daily.   triamcinolone 0.025 % ointment Commonly known as: KENALOG Apply 1 Application topically 2 (two) times daily. Use twice daily for 1 - 2 weeks as needed.   vitamin C 100 MG tablet Take 100 mg by mouth daily.   vitamin E 600 UNIT capsule Take 600 Units by mouth daily.          REVIEW OF SYSTEMS: A comprehensive ROS was conducted with the patient and is negative except as per HPI    OBJECTIVE:  VS: BP 110/72 (BP Location: Left Arm, Patient Position: Sitting, Cuff Size: Large)   Pulse 62   Ht 5' 5.5" (1.664 m)   Wt 205 lb (93 kg)   SpO2 99%   BMI 33.59 kg/m    Wt Readings from Last 3 Encounters:  09/07/22 199 lb (90.3 kg)  08/13/22 194 lb (88 kg)  08/11/22 203 lb (92.1 kg)     EXAM: General: Pt appears well and is in NAD  Eyes: External eye exam normal without stare, lid lag or exophthalmos.  EOM intact.  PERRL.  Neck: General: Supple without adenopathy. Thyroid: Thyroid size normal.  No goiter or nodules appreciated. No thyroid bruit.  Lungs: Clear with good BS bilat with no rales, rhonchi, or wheezes  Heart: Auscultation: RRR.  Abdomen: Normoactive bowel sounds, soft, nontender, without masses or organomegaly palpable   Extremities:  BL LE: No pretibial edema normal ROM and strength.  Mental Status: Judgment, insight: Intact Orientation: Oriented to time, place, and person Mood and affect: No depression, anxiety, or agitation     DATA REVIEWED:  Latest Reference Range & Units 09/10/22 14:17  Sodium 135 - 145 mEq/L 138  Potassium 3.5 - 5.1 mEq/L 4.1  Chloride 96 - 112 mEq/L 101  CO2 19 - 32 mEq/L 32  Glucose 70 - 99 mg/dL 95  BUN 6 - 23 mg/dL 11  Creatinine 0.40 - 1.20 mg/dL 0.66  Calcium 8.4 - 10.5  mg/dL 9.5  Albumin 3.5 - 5.2 g/dL 4.7  GFR >60.00 mL/min 88.54      Latest Reference Range & Units 09/10/22 14:17  VITD 30.00 - 100.00 ng/mL 46.67    Latest Reference Range & Units 09/10/22 14:17  TSH 0.35 - 5.50 uIU/mL 2.80  T4,Free(Direct) 0.60 - 1.60 ng/dL 0.95   DXA 12/10/2021   ASSESSMENT: The BMD measured at Femur Neck Left is 0.705 g/cm2 with a T-score of -2.4. This patient is considered osteopenic/low bone mass according to [facility]) criteria.   The quality of the exam is good. L3, L4 was excluded due to degenerative changes. The patient does not meet FRAX criteria.   Site Region Measured Date Measured Age YA BMD Significant CHANGE T-score DualFemur Neck Left  12/10/2021    70.2         -2.4    0.705 g/cm2 DualFemur Neck Left  02/22/2020    68.4         -2.1    0.743 g/cm2   AP Spine  L1-L2      12/10/2021    70.2         -1.4    0.992 g/cm2 AP Spine  L1-L2      02/22/2020    68.4         -1.0    1.043 g/cm2   DualFemur Total Mean 12/10/2021 70.2 -1.5 0.814 g/cm2 * DualFemur Total Mean 02/22/2020    68.4         -1.0    0.882 g/cm2    Old records , labs and images have been reviewed.    ASSESSMENT/PLAN/RECOMMENDATIONS:   Hx of Osteoporosis :  -Patient is a poor historian and was unable to provide detailed history.but it appears that she [place] .  She also does not recall when Prolia was started, in  reviewing her chart Prolia was on her medication list from 2018, but she did not start taking it through our system until February 2019 -Her last 2 DXA scans show low bone density , with decrease in BMD over the past 2 years -Patient is s/p gastric bypass, I have discussed the importance of optimizing calcium intake and switching calcium carbonate to calcium citrate -Vitamin D is normal continue current regimen -I have recommended completing Prolia for this year through her PCPs office.  As it appears she has been on it for at least 5 years.  I have recommended bisphosphonate therapy we discussed oral versus infusion.  Patient opted to receive zoledronic acid this year and will assess again next year  Medications : Switch calcium carbonate to calcium citrate 1200 mg daily Continue vitamin D 2000 IU twice daily Complete Prolia 2023 Start zoledronic acid 5 mg IV once yearly     Signed electronically by: Mack Guise, MD  [facility] End[facility]  [facility] [address], [place] [address] Phone: [phone] FAX: [phone]   CC: Allwardt, Randa Evens, PA-C [place] [address] Phone: [phone] Fax: [phone]   Return to Endocrinology clinic as below: Future Appointments  Date Time Provider [place]  09/10/2022  1:30 PM Bryndle Corredor, Melanie Crazier, MD [facility] None  10/13/2022 11:00 AM Pixie Casino, MD [facility]  10/29/2022 10:20 AM [facility] MM 2 [facility]  04/07/2023  8:45 AM Suzzanne Cloud, NP [facility] None

## 2022-09-10 ENCOUNTER — Ambulatory Visit: Payer: Medicare Other | Admitting: Internal Medicine

## 2022-09-10 ENCOUNTER — Encounter: Payer: Self-pay | Admitting: Internal Medicine

## 2022-09-10 VITALS — BP 110/72 | HR 62 | Ht 65.5 in | Wt 205.0 lb

## 2022-09-10 DIAGNOSIS — Z8739 Personal history of other diseases of the musculoskeletal system and connective tissue: Secondary | ICD-10-CM

## 2022-09-10 DIAGNOSIS — M859 Disorder of bone density and structure, unspecified: Secondary | ICD-10-CM | POA: Diagnosis not present

## 2022-09-10 DIAGNOSIS — Z23 Encounter for immunization: Secondary | ICD-10-CM

## 2022-09-10 LAB — BASIC METABOLIC PANEL
BUN: 11 mg/dL (ref 6–23)
CO2: 32 mEq/L (ref 19–32)
Calcium: 9.5 mg/dL (ref 8.4–10.5)
Chloride: 101 mEq/L (ref 96–112)
Creatinine, Ser: 0.66 mg/dL (ref 0.40–1.20)
GFR: 88.54 mL/min (ref >60.00)
Glucose, Bld: 95 mg/dL (ref 70–99)
Potassium: 4.1 mEq/L (ref 3.5–5.1)
Sodium: 138 mEq/L (ref 135–145)

## 2022-09-10 LAB — ALBUMIN: Albumin: 4.7 g/dL (ref 3.5–5.2)

## 2022-09-10 LAB — VITAMIN D 25 HYDROXY (VIT D DEFICIENCY, FRACTURES): VITD: 46.67 ng/mL (ref 30.00–100.00)

## 2022-09-10 LAB — TSH: TSH: 2.8 u[IU]/mL (ref 0.35–5.50)

## 2022-09-10 LAB — T4, FREE: Free T4: 0.95 ng/dL (ref 0.60–1.60)

## 2022-09-10 NOTE — Patient Instructions (Signed)
Please take Calcium Citrate 1200 mg daily  Continue Vitamin D 2000 iu Twice daily

## 2022-09-11 ENCOUNTER — Encounter: Payer: Self-pay | Admitting: Internal Medicine

## 2022-09-11 DIAGNOSIS — Z8739 Personal history of other diseases of the musculoskeletal system and connective tissue: Secondary | ICD-10-CM | POA: Insufficient documentation

## 2022-09-11 LAB — PARATHYROID HORMONE, INTACT (NO CA): PTH: 51 pg/mL (ref 16–77)

## 2022-09-14 ENCOUNTER — Telehealth: Payer: Self-pay | Admitting: Pharmacy Technician

## 2022-09-14 NOTE — Telephone Encounter (Signed)
Dr. Derenda Fennel, Juluis Rainier note:  Auth Submission: NO AUTH NEEDED Payer: BCBS MEDICARE Medication & CPT/J Code(s) submitted: Reclast (Zolendronic acid) [id number] Route of submission (phone, fax, portal):  Phone # Fax # Auth type: Buy/Bill Units/visits requested: X1 Reference number:  Approval from: 09/11/22 to 10/25/22    Patient will scheduled as soon as possible.

## 2022-10-01 ENCOUNTER — Encounter: Payer: Self-pay | Admitting: Internal Medicine

## 2022-10-02 ENCOUNTER — Ambulatory Visit: Payer: Medicare Other | Admitting: Internal Medicine

## 2022-10-06 ENCOUNTER — Encounter: Payer: Self-pay | Admitting: Physician Assistant

## 2022-10-06 ENCOUNTER — Encounter (HOSPITAL_BASED_OUTPATIENT_CLINIC_OR_DEPARTMENT_OTHER): Payer: Self-pay | Admitting: Internal Medicine

## 2022-10-06 NOTE — Telephone Encounter (Signed)
Please advise 

## 2022-10-08 NOTE — Telephone Encounter (Signed)
Can you help with routing this to the correct place?

## 2022-10-11 ENCOUNTER — Encounter: Payer: Self-pay | Admitting: Physician Assistant

## 2022-10-12 LAB — NMR, LIPOPROFILE
Cholesterol, Total: 166 mg/dL (ref 100–199)
HDL Particle Number: 44.3 umol/L (ref >=30.5)
HDL-C: 74 mg/dL (ref >39)
LDL Particle Number: 952 nmol/L (ref <1000)
LDL Size: 21.4 nm (ref >20.5)
LDL-C (NIH Calc): 77 mg/dL (ref 0–99)
LP-IR Score: <25 (ref <=45)
Small LDL Particle Number: 375 nmol/L (ref <=527)
Triglycerides: 79 mg/dL (ref 0–149)

## 2022-10-12 LAB — LIPOPROTEIN A (LPA): Lipoprotein (a): 32.3 nmol/L (ref <75.0)

## 2022-10-12 NOTE — Telephone Encounter (Signed)
Ok to place referral to dermatology?

## 2022-10-13 ENCOUNTER — Other Ambulatory Visit: Payer: Self-pay

## 2022-10-13 ENCOUNTER — Encounter (HOSPITAL_BASED_OUTPATIENT_CLINIC_OR_DEPARTMENT_OTHER): Payer: Self-pay | Admitting: Internal Medicine

## 2022-10-13 ENCOUNTER — Ambulatory Visit (HOSPITAL_BASED_OUTPATIENT_CLINIC_OR_DEPARTMENT_OTHER): Payer: Medicare Other | Admitting: Internal Medicine

## 2022-10-13 VITALS — BP 122/72 | HR 70 | Ht 65.5 in | Wt 202.0 lb

## 2022-10-13 DIAGNOSIS — R238 Other skin changes: Secondary | ICD-10-CM

## 2022-10-13 DIAGNOSIS — E785 Hyperlipidemia, unspecified: Secondary | ICD-10-CM

## 2022-10-13 DIAGNOSIS — R931 Abnormal findings on diagnostic imaging of heart and coronary circulation: Secondary | ICD-10-CM

## 2022-10-13 DIAGNOSIS — N898 Other specified noninflammatory disorders of vagina: Secondary | ICD-10-CM

## 2022-10-13 NOTE — Patient Instructions (Signed)
Medication Instructions:  NO CHANGES  *If you need a refill on your cardiac medications before your next appointment, please call your pharmacy*   Lab Work: FASTING NMR lipoprofile to check cholesterol in 1 year   If you have labs (blood work) drawn today and your tests are completely normal, you will receive your results only by: [place] (if you have MyChart) OR A paper copy in the mail If you have any lab test that is abnormal or we need to change your treatment, we will call you to review the results.  Follow-Up: At [facility], you and your health needs are our priority.  As part of our continuing mission to provide you with exceptional heart care, we have created designated Provider Care Teams.  These Care Teams include your primary Cardiologist (physician) and Advanced Practice Providers (APPs -  Physician Assistants and Nurse Practitioners) who all work together to provide you with the care you need, when you need it.  We recommend signing up for the patient portal called "MyChart".  Sign up information is provided on this After Visit Summary.  MyChart is used to connect with patients for Virtual Visits (Telemedicine).  Patients are able to view lab/test results, encounter notes, upcoming appointments, etc.  Non-urgent messages can be sent to your provider as well.   To learn more about what you can do with MyChart, go to [email or web address].    Your next appointment:   12 month(s)  The format for your next appointment:   In Person  Provider:   Lyman Bishop MD - lipid clinic  ** Call in [place] for a December appointment

## 2022-10-13 NOTE — Progress Notes (Signed)
LIPID CLINIC CONSULT NOTE  Chief Complaint:  Follow-up dyslipidemia  Primary Care Physician: Allwardt, Randa Evens, PA-C  Primary Cardiologist:  None  HPI:  Summer [name] is a 71 y.o. female who is being seen today for the evaluation of dyslipidemia at the request of Allwardt, Alyssa M, PA-C. This is a pleasant 71 year old female kindly referred for evaluation management of dyslipidemia.  She recently underwent calcium scoring to better restratify her.  The calcium score was abnormal resulting total of 896, 96 percentile for age and sex matched controls.  She has not been on any lipid-lowering therapy prior to this.  It was noted that her recent lipid profile showed total cholesterol 188, triglycerides 68, HDL 60 and LDL 115.  Other medical problems include prior history of alcoholism but abstinent for more than 5 years, iron deficiency anemia, osteoporosis and seizures on combination of Lamictal and Keppra.  There is no family history of early onset heart disease.  She tries to eat a diet low in saturated fats.  10/13/2022  Ms. Byrum is seen today in follow-up.  She has done very well on statin therapy.  Her lipids are significantly improved.  Total cholesterol now 166, triglycerides 79, HDL 74 and LDL 77.  LDL particle number is low at 952 with a small LDL particle #375.  Fortunately, her LP(a) was negative at 32 nmol/L.  PMHx:  Past Medical History:  Diagnosis Date   Adenomatous polyp of sigmoid colon 03/18/2017   Overview:  Added automatically from request for surgery [id number]   Alcoholism ([place])    01/18/18 - no use in 5 years   Asymptomatic varicose veins 01/11/2017   Cancer ([place])    skin ca   Iron deficiency anemia 01/12/2017   Last Assessment & Plan:  Given script for ferrous sulfate and order for repeat cbc with diff and iron studies to be done in 1 month Will also refer pt back to [facility] for repeat colonoscopy and to dietician for nutritional counseling, per pt request    Lentigines 01/11/2017   Melanocytic nevi of left lower limb, including hip 01/11/2017   Melanocytic nevi of left upper limb, including shoulder 01/11/2017   Melanocytic nevi of right lower limb, including hip 01/11/2017   Melanocytic nevi of right upper limb, including shoulder 01/11/2017   Melanocytic nevi of trunk 01/11/2017   Obesity (BMI 30-39.9) 05/21/2017   Osteoporosis    Plantar fasciitis    Primary osteoarthritis, right shoulder 12/11/2016   Seizure ([place])    LAST SEIZURE- 2016 per pt   Sleep apnea    no cpap per pt   Unilateral primary osteoarthritis, left knee 12/11/2016    Past Surgical History:  Procedure Laterality Date   BARIATRIC SURGERY     COLONOSCOPY  03/18/2017   in [place].   ECTOPIC PREGNANCY SURGERY     POLYPECTOMY     SIGMOIDOSCOPY     in [place].   TOTAL KNEE ARTHROPLASTY Left 09/07/2017   Procedure: LEFT TOTAL KNEE ARTHROPLASTY;  Surgeon: Paralee Cancel, MD;  Location: [facility];  Service: Orthopedics;  Laterality: Left;  Adductor Block    FAMHx:  Family History  Problem Relation Age of Onset   Mitral valve prolapse Mother    Osteoporosis Mother    Memory loss Mother    Colon cancer Father 80   Alcoholism Father    Colon polyps Neg Hx    Esophageal cancer Neg Hx    Rectal cancer Neg Hx    Stomach  cancer Neg Hx     SOCHx:   reports that she has never smoked. She has never used smokeless tobacco. She reports that she does not drink alcohol and does not use drugs.  ALLERGIES:  Allergies  Allergen Reactions   Bee Venom Swelling    ROS: Pertinent items noted in HPI and remainder of comprehensive ROS otherwise negative.  HOME MEDS: Current Outpatient Medications on File Prior to Visit  Medication Sig Dispense Refill   Ascorbic Acid (VITAMIN C) 100 MG tablet Take 100 mg by mouth daily.     aspirin EC 81 MG tablet Take 81 mg by mouth daily. Swallow whole.     Calcium-Vitamin D-Vitamin K 998-3382-50 MG-UNT-MCG TABS Take 1 tablet by mouth 2 (two) times daily.  Also includes Vitamin C     Cholecalciferol 100 MCG (4000 UT) CAPS Take 4,000 Units by mouth daily.     COVID-19 mRNA vaccine 2023-2024 (COMIRNATY) syringe Inject into the muscle. 0.3 mL 0   denosumab (PROLIA) 60 MG/ML SOLN injection Inject 60 mg into the skin every 6 (six) months. Administer in upper arm, thigh, or abdomen     EPINEPHrine 0.3 mg/0.3 mL IJ SOAJ injection Inject 0.3 mLs (0.3 mg total) into the muscle as needed for anaphylaxis. 2 each 1   FEROSUL 325 (65 Fe) MG tablet TAKE ONE TABLET BY MOUTH DAILY WITH VITAMIN C. 30 tablet 2   lamoTRIgine (LAMICTAL) 150 MG tablet Take 1 tablet (150 mg total) by mouth 2 (two) times daily. 180 tablet 4   levETIRAcetam (KEPPRA) 500 MG tablet Take 1 tablet twice daily 180 tablet 4   nystatin cream (MYCOSTATIN) Apply 1 Application topically 2 (two) times daily. 30 g 0   rosuvastatin (CRESTOR) 20 MG tablet Take 1 tablet (20 mg total) by mouth daily. 90 tablet 3   triamcinolone (KENALOG) 0.025 % ointment Apply 1 Application topically 2 (two) times daily. Use twice daily for 1 - 2 weeks as needed. 30 g 0   vitamin E 600 UNIT capsule Take 600 Units by mouth daily.     No current facility-administered medications on file prior to visit.    LABS/IMAGING: No results found for this or any previous visit (from the past 48 hour(s)). No results found.  LIPID PANEL:    Component Value Date/Time   CHOL 188 06/16/2021 1341   TRIG 68.0 06/16/2021 1341   HDL 59.90 06/16/2021 1341   CHOLHDL 3 06/16/2021 1341   VLDL 13.6 06/16/2021 1341   LDLCALC 115 (H) 06/16/2021 1341    WEIGHTS: Wt Readings from Last 3 Encounters:  10/13/22 202 lb (91.6 kg)  09/10/22 205 lb (93 kg)  09/07/22 199 lb (90.3 kg)    VITALS: BP 122/72   Pulse 70   Ht 5' 5.5" (1.664 m)   Wt 202 lb (91.6 kg)   BMI 33.10 kg/m   EXAM: Deferred  EKG: Deferred  ASSESSMENT: Mixed dyslipidemia, goal LDL less than 70 High coronary calcium score 896, 96 percentile for age and sex  matched controls Negative LP(a)  PLAN: 1.   Ms. Sawa fortunately had a negative LP(a).  Her LDL is much improved with particle numbers at goal and LDL-C nearly at goal.  I think with additional work with exercise, weight loss and dietary changes she will meet her targets.  I would advise continuing her current therapy.  She would like to follow-up with me annually or sooner as necessary.  Pixie Casino, MD, [facility], [place]  [facility] of the Advanced Lipid Disorders &  Cardiovascular Risk Reduction Clinic Diplomate of the American Board of Clinical Lipidology Attending Cardiologist  Direct Dial: [phone]  Fax: [phone]  Website:  [email or web address] Kimberley 10/13/2022, 11:09 AM

## 2022-10-26 DIAGNOSIS — Z5309 Procedure and treatment not carried out because of other contraindication: Secondary | ICD-10-CM

## 2022-10-26 HISTORY — DX: Procedure and treatment not carried out because of other contraindication: Z53.09

## 2022-10-29 ENCOUNTER — Ambulatory Visit
Admission: RE | Admit: 2022-10-29 | Discharge: 2022-10-29 | Disposition: A | Discharge status: Home / Self Care | Payer: Medicare Other | Source: Ambulatory Visit | Attending: Physician Assistant | Admitting: Physician Assistant

## 2022-10-29 DIAGNOSIS — Z1231 Encounter for screening mammogram for malignant neoplasm of breast: Secondary | ICD-10-CM

## 2022-11-03 ENCOUNTER — Ambulatory Visit (INDEPENDENT_AMBULATORY_CARE_PROVIDER_SITE_OTHER): Payer: Medicare Other | Admitting: *Deleted

## 2022-11-03 ENCOUNTER — Other Ambulatory Visit: Payer: Self-pay | Admitting: Pharmacy Technician

## 2022-11-03 VITALS — BP 131/84 | HR 61 | Temp 97.7°F | Resp 16 | Ht 65.0 in | Wt 204.6 lb

## 2022-11-03 DIAGNOSIS — M810 Age-related osteoporosis without current pathological fracture: Secondary | ICD-10-CM | POA: Diagnosis not present

## 2022-11-03 MED ORDER — ACETAMINOPHEN 325 MG PO TABS
650.0000 mg | ORAL_TABLET | Freq: Once | ORAL | Status: AC
  Administered 2022-11-03: 650 mg via ORAL
  Filled 2022-11-03: qty 2

## 2022-11-03 MED ORDER — ZOLEDRONIC ACID 5 MG/100ML IV SOLN
5.0000 mg | Freq: Once | INTRAVENOUS | Status: AC
  Administered 2022-11-03: 5 mg via INTRAVENOUS
  Filled 2022-11-03: qty 100

## 2022-11-03 MED ORDER — DIPHENHYDRAMINE HCL 25 MG PO CAPS
25.0000 mg | ORAL_CAPSULE | Freq: Once | ORAL | Status: AC
  Administered 2022-11-03: 25 mg via ORAL
  Filled 2022-11-03: qty 1

## 2022-11-03 MED ORDER — SODIUM CHLORIDE 0.9 % IV SOLN: INTRAVENOUS | Status: DC

## 2022-11-03 NOTE — Progress Notes (Signed)
Diagnosis: Osteoporosis  Provider:  Marshell Garfinkel MD  Procedure: Infusion  IV Type: Peripheral, IV Location: L Antecubital  Reclast (Zolendronic Acid), Dose: 5 mg  Infusion Start Time: 6644 am  Infusion Stop Time: 0347 am  Post Infusion IV Care: Observation period completed and Peripheral IV Discontinued  Discharge: Condition: Good, Destination: Home . AVS provided to patient.   Performed by:  Oren Beckmann, RN

## 2022-11-23 ENCOUNTER — Encounter: Payer: Self-pay | Admitting: Obstetrics and Gynecology

## 2022-11-23 ENCOUNTER — Encounter: Payer: Self-pay | Admitting: Physician Assistant

## 2022-11-23 DIAGNOSIS — N763 Subacute and chronic vulvitis: Secondary | ICD-10-CM

## 2022-11-23 DIAGNOSIS — N952 Postmenopausal atrophic vaginitis: Secondary | ICD-10-CM

## 2022-11-24 NOTE — Telephone Encounter (Signed)
[place] for vaginal Estrace cream 0.01%, apply 1/2 gram to vagina/vulva nightly for 2 weeks and then reduce to 1/2 gram to vagina/vulva 2 - 3 times weekly.   Disp:  42.5 gram.   RF:  one.   Please schedule a recheck with me for 6 weeks.  We discussed a possible vulvar biopsy if her symptoms are not improved.

## 2022-11-24 NOTE — Telephone Encounter (Signed)
Please see pt message and advise 

## 2022-11-25 MED ORDER — ESTRADIOL 0.1 MG/GM VA CREA: TOPICAL_CREAM | VAGINAL | 1 refills | Status: DC

## 2022-11-26 NOTE — Telephone Encounter (Signed)
[name], Summer  [name], [name], CMA Left message for patient to call and schedule appointment. 

## 2022-12-03 ENCOUNTER — Encounter: Payer: Self-pay | Admitting: Obstetrics & Gynecology

## 2022-12-08 NOTE — Telephone Encounter (Signed)
Please see pt msg and advise 

## 2023-01-04 NOTE — Progress Notes (Unsigned)
GYNECOLOGY  VISIT   HPI: 72 y.o.   Married  Caucasian  female   [id number] with No LMP recorded. Patient is postmenopausal.   here for follow up.   Patient was seen for chronic vulvitis on 09/07/22 and was prescribed triamcinolone.  Vaginitis testing was negative.  Cherry hemangioma and no lesions were seen.  She has been experiencing skin irritation along her inner thighs.  She reports that the area is getting larger.  She saw a dermatologist who did not identified any lesion and did not prescribe any treatment.  She was told to stop use of a one of her medications to avoid thinning of the tissue. Also using an over the counter fungal treatment with tea tree oil.  She has not started the prescription for vaginal estrogen cream.   Does water aerobics several times a week, and started this just recently.   From [place], [place].   GYNECOLOGIC HISTORY: No LMP recorded. Patient is postmenopausal. Contraception:  PMP Menopausal hormone therapy:  n/a Last mammogram:  10/29/22 Breast Density Category B, BI-RADS CATEGORY 1 neg Last pap smear:    11/28/20 HPV neg, normal.         OB History     Gravida  1   Para      Term      Preterm      AB  1   Living  1      SAB  0   IAB      Ectopic  1   Multiple      Live Births                 Patient Active Problem List   Diagnosis Date Noted   Hx of osteoporosis 09/11/2022   Epilepsy ([place]) 12/31/2020   Mixed hyperlipidemia 07/07/2019   Vitamin D deficiency 07/07/2019   Partial symptomatic epilepsy with complex partial seizures, not intractable, without status epilepticus ([place]) 01/18/2018   Facial paresthesia 01/18/2018   S/P total knee replacement 09/07/2017   Obesity (BMI 30-39.9) 05/21/2017   Adenomatous polyp of sigmoid colon 03/18/2017   Family history of colon cancer in father 02/23/2017   Iron deficiency anemia 01/12/2017   Neck pain 01/11/2017   Osteoporosis 01/11/2017   Lentigines 01/11/2017   Primary  osteoarthritis, right shoulder 12/11/2016    Past Medical History:  Diagnosis Date   Adenomatous polyp of sigmoid colon 03/18/2017   Overview:  Added automatically from request for surgery [id number]   Alcoholism ([place])    01/18/18 - no use in 5 years   Asymptomatic varicose veins 01/11/2017   Cancer ([place])    skin ca   Iron deficiency anemia 01/12/2017   Last Assessment & Plan:  Given script for ferrous sulfate and order for repeat cbc with diff and iron studies to be done in 1 month Will also refer pt back to CRS for repeat colonoscopy and to dietician for nutritional counseling, per pt request   Lentigines 01/11/2017   Melanocytic nevi of left lower limb, including hip 01/11/2017   Melanocytic nevi of left upper limb, including shoulder 01/11/2017   Melanocytic nevi of right lower limb, including hip 01/11/2017   Melanocytic nevi of right upper limb, including shoulder 01/11/2017   Melanocytic nevi of trunk 01/11/2017   Obesity (BMI 30-39.9) 05/21/2017   Osteoporosis    Plantar fasciitis    Primary osteoarthritis, right shoulder 12/11/2016   Seizure ([place])    LAST SEIZURE- 2016 per pt   Sleep apnea  no cpap per pt   Statins contraindicated 10/2022   Unilateral primary osteoarthritis, left knee 12/11/2016    Past Surgical History:  Procedure Laterality Date   BARIATRIC SURGERY     COLONOSCOPY  03/18/2017   in [place].   ECTOPIC PREGNANCY SURGERY     POLYPECTOMY     SIGMOIDOSCOPY     in [place].   TOTAL KNEE ARTHROPLASTY Left 09/07/2017   Procedure: LEFT TOTAL KNEE ARTHROPLASTY;  Surgeon: Paralee Cancel, MD;  Location: [facility];  Service: Orthopedics;  Laterality: Left;  Adductor Block    Current Outpatient Medications  Medication Sig Dispense Refill   Ascorbic Acid (VITAMIN C) 100 MG tablet Take 100 mg by mouth daily.     aspirin EC 81 MG tablet Take 81 mg by mouth daily. Swallow whole.     Calcium-Vitamin D-Vitamin K [id number] MG-UNT-MCG TABS Take 1 tablet by mouth 2 (two)  times daily. Also includes Vitamin C     Cholecalciferol 100 MCG (4000 UT) CAPS Take 4,000 Units by mouth daily.     denosumab (PROLIA) 60 MG/ML SOLN injection Inject 60 mg into the skin every 6 (six) months. Administer in upper arm, thigh, or abdomen     EPINEPHrine 0.3 mg/0.3 mL IJ SOAJ injection Inject 0.3 mLs (0.3 mg total) into the muscle as needed for anaphylaxis. 2 each 1   estradiol (ESTRACE) 0.1 MG/GM vaginal cream Apply 1/2 gram to vagina/vulva nightly x two weeks, then reduce to 1/2 gm to vagina/vulva 2-3 times a week. 42.5 g 1   FEROSUL 325 (65 Fe) MG tablet TAKE ONE TABLET BY MOUTH DAILY WITH VITAMIN C. 30 tablet 2   lamoTRIgine (LAMICTAL) 150 MG tablet Take 1 tablet (150 mg total) by mouth 2 (two) times daily. 180 tablet 4   levETIRAcetam (KEPPRA) 500 MG tablet Take 1 tablet twice daily 180 tablet 4   nystatin cream (MYCOSTATIN) Apply 1 Application topically 2 (two) times daily. 30 g 0   rosuvastatin (CRESTOR) 20 MG tablet Take 1 tablet (20 mg total) by mouth daily. 90 tablet 3   vitamin E 600 UNIT capsule Take 600 Units by mouth daily.     triamcinolone (KENALOG) 0.025 % ointment Apply 1 Application topically 2 (two) times daily. Use twice daily for 1 - 2 weeks as needed. (Patient not taking: Reported on 01/18/2023) 30 g 0   No current facility-administered medications for this visit.     ALLERGIES: Bee venom  Family History  Problem Relation Age of Onset   Mitral valve prolapse Mother    Osteoporosis Mother    Memory loss Mother    Colon cancer Father 73   Alcoholism Father    Colon polyps Neg Hx    Esophageal cancer Neg Hx    Rectal cancer Neg Hx    Stomach cancer Neg Hx     Social History   Socioeconomic History   Marital status: Married    Spouse name: Not on file   Number of children: 1   Years of education: college   Highest education level: Bachelor's degree (e.g., BA, AB, BS)  Occupational History   Occupation: Retired  Tobacco Use   Smoking status:  Never   Smokeless tobacco: Never  Vaping Use   Vaping Use: Never used  Substance and Sexual Activity   Alcohol use: No   Drug use: No   Sexual activity: Never    Birth control/protection: Post-menopausal  Other Topics Concern   Not on file  Social History Narrative  Lives at home with husband.   Right-handed.   3 cups caffeine per day.   Cares for 2 grandchildren 3 days per week    Social Determinants of Health   Financial Resource Strain: Low Risk  (08/13/2022)   Overall Financial Resource Strain (CARDIA)    Difficulty of Paying Living Expenses: Not hard at all  Food Insecurity: No Food Insecurity (08/13/2022)   Hunger Vital Sign    Worried About Running Out of Food in the Last Year: Never true    Ran Out of Food in the Last Year: Never true  Transportation Needs: No Transportation Needs (08/13/2022)   PRAPARE - Hydrologist (Medical): No    Lack of Transportation (Non-Medical): No  Physical Activity: Sufficiently Active (08/13/2022)   Exercise Vital Sign    Days of Exercise per Week: 4 days    Minutes of Exercise per Session: 50 min  Stress: No Stress Concern Present (08/13/2022)   [place]    Feeling of Stress : Not at all  Social Connections: Moderately Integrated (08/13/2022)   Social Connection and Isolation Panel [NHANES]    Frequency of Communication with Friends and Family: More than three times a week    Frequency of Social Gatherings with Friends and Family: More than three times a week    Attends Religious Services: Never    Marine scientist or Organizations: Yes    Attends Archivist Meetings: 1 to 4 times per year    Marital Status: Married  Human resources officer Violence: Not At Risk (08/13/2022)   Humiliation, Afraid, Rape, and Kick questionnaire    Fear of Current or Ex-Partner: No    Emotionally Abused: No    Physically Abused: No    Sexually  Abused: No    Review of Systems  All other systems reviewed and are negative.   PHYSICAL EXAMINATION:    BP 118/62 (BP Location: Right Arm, Patient Position: Sitting, Cuff Size: Large)   Pulse 68   Ht 5' 5.5" (1.664 m)   Wt 197 lb (89.4 kg)   SpO2 98%   BMI 32.28 kg/m     General appearance: alert, cooperative and appears stated age   Pelvic: External genitalia:  no lesions              Urethra:  normal appearing urethra with no masses, tenderness or lesions              Bartholins and Skenes: normal                 Vagina: normal appearing vagina with normal color and discharge, no lesions              Cervix: no lesions                Bimanual Exam:  Uterus:  normal size, contour, position, consistency, mobility, non-tender              Adnexa: no mass, fullness, tenderness         Chaperone was present for exam:  Raquel Sarna  ASSESSMENT  Chronic vulvitis.  Vaginal atrophy. Thigh skin irritation.  No lesions noted.  Possible neuropathy?  PLAN  We discussed reducing exposure to irritants and using soaps that are more skin friendly.  Consider use of Claritin, Allegra, or Benadryl for skin irritation.  She has a prescription for local vaginal estrogen, and she was instructed in use.  I do recommend yearly mammogram due to potential effect of estrogen cream on breast cancers. She will need to be seen for a breast and pelvic exam for any future refills of the vaginal estrogen.  No refill of triamcinolone.  Has neurology appointment with [facility] and will discuss her skin irritation with her provider.  I raised the possibility that she is experiencing neuropathy.   An After Visit Summary was printed and given to the patient.  25 min  total time was spent for this patient encounter, including preparation, face-to-face counseling with the patient, coordination of care, and documentation of the encounter.

## 2023-01-18 ENCOUNTER — Ambulatory Visit: Payer: Medicare Other | Admitting: Obstetrics and Gynecology

## 2023-01-18 ENCOUNTER — Encounter: Payer: Self-pay | Admitting: Obstetrics and Gynecology

## 2023-01-18 VITALS — BP 118/62 | HR 68 | Ht 65.5 in | Wt 197.0 lb

## 2023-01-18 DIAGNOSIS — R238 Other skin changes: Secondary | ICD-10-CM

## 2023-01-18 DIAGNOSIS — N952 Postmenopausal atrophic vaginitis: Secondary | ICD-10-CM

## 2023-03-15 ENCOUNTER — Ambulatory Visit: Payer: Medicare Other | Admitting: Internal Medicine

## 2023-04-06 NOTE — Progress Notes (Unsigned)
Patient: Summer [name] Date of Birth: 04-01-1951  Reason for Visit: Follow up for seizure History from: Patient Primary Neurologist: Dr.Yan  ASSESSMENT AND PLAN 72 y.o. year old female   1.  Complex partial seizure with secondary generalization, last seizure was in 2016 -For now, prefers to remain on dual AED, continue Keppra 500 mg twice a day, Lamictal 150 mg twice daily -Update CMP, drug levels -If we do move to single agent, would increase Keppra 500/1000 mg daily, slow taper off Lamictal, check EEG beforehand -Follow-up in 1 year or sooner if needed  HISTORY  Summer [name] is a 72 year old female, seen in refer by her primary care doctor Helane Rima, for evaluation of seizure, facial paresthesia, initial evaluation was on January 18, 2018.   She had a history of osteoporosis, is taking Prolia 60 mg every 6 months   History of complex partial seizure with secondary generalization   I reviewed previous neurology evaluation from [facility] clinic by Dr.Jehi   First seizure was in the labor room at age 22 after delivery, no warning signs,   Around 2015, she started binge drink for 3-4 years, she had recurrent seizure in February 2015, patient was amnestic of the event, husband got a call from her primary care physician the patient had a seizure during her routine appointment, with tongue biting, was admitted to [facility],     CT, MRI of the brain with and without contrast showed mild supratentorium small vessel disease, EEG showed no acute abnormality, no antiepileptic medication was given, record mentions that was thought to be due to alcohol withdrawal, patient stated that she was drinking heavily, hard liquor on a daily basis, but did stop drinking for weeks prior to the event in February 2015.   Second event July 2015, was at a work picnic, then woke up on the hospital, was witnessed by a physician there that she had a seizure, loss of consciousness with  tonic-clonic activity of the extremities that lasted 5 minutes, followed by 30-60 minutes post ictal confusion, Keppra was started at 750 twice a day, later was increased to 1000 mg twice a day later, patient complains of feeling sleepy,   Third event was in [place], she was witnessed by her friend that she has transient episode of confusion,   Her medication was later changed to lamotrigine 150 mg twice a day, Keppra 500 mg twice daily, stayed on that regimen since, she is tolerating the medication well, there was no recurrent seizures.   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   Today she also complains of more than 3 years history of constant right facial paresthesia, burning hypersensitivity, mild asymmetry of the face, she noticed more wrinkles in her left lower face while woke up in the morning time.   Laboratory evaluation in January 2019 showed mild elevated LDL 101, negative troponin, INR 1.01, d-dimer was mildly elevated at 0.95, negative troponin, normal BMP, CBC    UPDATE Sept 5 2019: She is doing very well, had no recurrent seizure, has quit drinking. Now taking keppra 500mg  bid and lamotrigine 150mg  bid.    We personally reviewed MRI brain w/wo in May 2019, there are chronic small vessel disease, no acute abnormalities.   EEG was normal in June 2019   Update November 13, 2019 SS: Since last seen, she  remained on both Lamictal and Keppra, she was nervous to come off the Lamictal quickly, after speaking with [facility] clinic she decided to remain on dual therapy.  She has continued to do well, has not had recurrent seizure.  She is open to coming off the Lamictal, if done slowly.  She has continued right-sided facial paresthesia, pressure to right face, some asymmetry on the right x  3 years.  Overall, has been doing well, has her grandson with her today.    Update November 14, 2020 SS: Here today for follow-up via virtual visit, continues to do well, no recurrent seizures (last in 2016ish).  Has decided to remain on Keppra 500 mg twice a day, Lamictal 150 mg twice a day.  Previously when she saw Dr. Terrace Arabia in 2019, decided to taper off Lamictal, do higher dose Keppra 500/1000.  She felt more comfortable with dual treatment, worried she would have a seizure, lose her driver's license in [place]. Moved from [place]. She takes care of her 2 grandkids during the day.  Is on vitamin D supplement.  Doing well, sees Dr. Artis Flock PCP.   Update November 19, 2021 SS: Here today via telephone visit, couldn't connect via my chart. Couldn't come into the office due to COVID exposure. Health is good. Going to the gym 4-5 days a week taking aerobics class. No seizures. Last was in 2015. Hasn't wanted to reduce AED to single treatment.  Takes care of her grandchildren. Remains on Vitamin D, drives. On oral iron.   Update 04/01/22 SS: Doing well, remains on Keppra 500 mg twice a day, Lamictal 150 mg twice daily.  Has decided to stay on dual treatment, even though in 2019 plan was to increase Keppra 500/1000 mg daily, taper off Lamictal.  She cares for her to prevent she is on Prolia, takes vitamin D.  She is very active, goes to the gym.  Has no adverse effects from AED.   Update April 07, 2023 SS:   REVIEW OF SYSTEMS: Out of a complete 14 system review of symptoms, the patient complains only of the following symptoms, and all other reviewed systems are negative.  See HPI  ALLERGIES: Allergies  Allergen Reactions   Bee Venom Swelling    HOME MEDICATIONS: Outpatient Medications Prior to Visit  Medication Sig Dispense Refill   Ascorbic Acid (VITAMIN C) 100 MG tablet Take 100 mg by mouth daily.     aspirin EC 81 MG tablet Take 81 mg by mouth daily. Swallow whole.     Calcium-Vitamin D-Vitamin K [phone]  MG-UNT-MCG TABS Take 1 tablet by mouth 2 (two) times daily. Also includes Vitamin C     Cholecalciferol 100 MCG (4000 UT) CAPS Take 4,000 Units by mouth daily.     denosumab (PROLIA) 60 MG/ML SOLN injection Inject 60 mg into the skin every 6 (six) months. Administer in upper arm, thigh, or abdomen     EPINEPHrine 0.3 mg/0.3 mL IJ SOAJ injection Inject 0.3 mLs (0.3 mg total) into the muscle as needed for anaphylaxis. 2 each 1   estradiol (ESTRACE) 0.1 MG/GM vaginal cream Apply 1/2 gram to vagina/vulva nightly x two weeks, then reduce to 1/2 gm to vagina/vulva 2-3 times a week. 42.5 g 1   FEROSUL 325 (65 Fe) MG tablet TAKE ONE TABLET BY MOUTH DAILY WITH VITAMIN C. 30 tablet 2   lamoTRIgine (LAMICTAL) 150 MG tablet Take 1 tablet (150 mg total) by mouth 2 (two) times daily. 180 tablet 4   levETIRAcetam (KEPPRA)  500 MG tablet Take 1 tablet twice daily 180 tablet 4   nystatin cream (MYCOSTATIN) Apply 1 Application topically 2 (two) times daily. 30 g 0   rosuvastatin (CRESTOR) 20 MG tablet Take 1 tablet (20 mg total) by mouth daily. 90 tablet 3   triamcinolone (KENALOG) 0.025 % ointment Apply 1 Application topically 2 (two) times daily. Use twice daily for 1 - 2 weeks as needed. (Patient not taking: Reported on 01/18/2023) 30 g 0   vitamin E 600 UNIT capsule Take 600 Units by mouth daily.     No facility-administered medications prior to visit.    PAST MEDICAL HISTORY: Past Medical History:  Diagnosis Date   Adenomatous polyp of sigmoid colon 03/18/2017   Overview:  Added automatically from request for surgery [phone]   Alcoholism (HCC)    01/18/18 - no use in 5 years   Asymptomatic varicose veins 01/11/2017   Cancer (HCC)    skin ca   Iron deficiency anemia 01/12/2017   Last Assessment & Plan:  Given script for ferrous sulfate and order for repeat cbc with diff and iron studies to be done in 1 month Will also refer pt back to [facility] for repeat colonoscopy and to dietician for nutritional counseling,  per pt request   Lentigines 01/11/2017   Melanocytic nevi of left lower limb, including hip 01/11/2017   Melanocytic nevi of left upper limb, including shoulder 01/11/2017   Melanocytic nevi of right lower limb, including hip 01/11/2017   Melanocytic nevi of right upper limb, including shoulder 01/11/2017   Melanocytic nevi of trunk 01/11/2017   Obesity (BMI 30-39.9) 05/21/2017   Osteoporosis    Plantar fasciitis    Primary osteoarthritis, right shoulder 12/11/2016   Seizure (HCC)    LAST SEIZURE- 2016 per pt   Sleep apnea    no cpap per pt   Statins contraindicated 10/2022   Unilateral primary osteoarthritis, left knee 12/11/2016    PAST SURGICAL HISTORY: Past Surgical History:  Procedure Laterality Date   BARIATRIC SURGERY     COLONOSCOPY  03/18/2017   in [place].   ECTOPIC PREGNANCY SURGERY     POLYPECTOMY     SIGMOIDOSCOPY     in [place].   TOTAL KNEE ARTHROPLASTY Left 09/07/2017   Procedure: LEFT TOTAL KNEE ARTHROPLASTY;  Surgeon: Durene Romans, MD;  Location: [facility];  Service: Orthopedics;  Laterality: Left;  Adductor Block    FAMILY HISTORY: Family History  Problem Relation Age of Onset   Mitral valve prolapse Mother    Osteoporosis Mother    Memory loss Mother    Colon cancer Father 72   Alcoholism Father    Colon polyps Neg Hx    Esophageal cancer Neg Hx    Rectal cancer Neg Hx    Stomach cancer Neg Hx     SOCIAL HISTORY: Social History   Socioeconomic History   Marital status: Married    Spouse name: Not on file   Number of children: 1   Years of education: college   Highest education level: Bachelor's degree (e.g., BA, AB, BS)  Occupational History   Occupation: Retired  Tobacco Use   Smoking status: Never   Smokeless tobacco: Never  Vaping Use   Vaping Use: Never used  Substance and Sexual Activity   Alcohol use: No   Drug use: No   Sexual activity: Never    Birth control/protection: Post-menopausal  Other Topics Concern   Not on file   Social History Narrative   Lives  at home with husband.   Right-handed.   3 cups caffeine per day.   Cares for 2 grandchildren 3 days per week    Social Determinants of Health   Financial Resource Strain: Low Risk  (08/13/2022)   Overall Financial Resource Strain (CARDIA)    Difficulty of Paying Living Expenses: Not hard at all  Food Insecurity: No Food Insecurity (08/13/2022)   Hunger Vital Sign    Worried About Running Out of Food in the Last Year: Never true    Ran Out of Food in the Last Year: Never true  Transportation Needs: No Transportation Needs (08/13/2022)   PRAPARE - Administrator, Civil Service (Medical): No    Lack of Transportation (Non-Medical): No  Physical Activity: Sufficiently Active (08/13/2022)   Exercise Vital Sign    Days of Exercise per Week: 4 days    Minutes of Exercise per Session: 50 min  Stress: No Stress Concern Present (08/13/2022)   [employer] of Occupational Health - Occupational Stress Questionnaire    Feeling of Stress : Not at all  Social Connections: Moderately Integrated (08/13/2022)   Social Connection and Isolation Panel [NHANES]    Frequency of Communication with Friends and Family: More than three times a week    Frequency of Social Gatherings with Friends and Family: More than three times a week    Attends Religious Services: Never    Database administrator or Organizations: Yes    Attends Banker Meetings: 1 to 4 times per year    Marital Status: Married  Catering manager Violence: Not At Risk (08/13/2022)   Humiliation, Afraid, Rape, and Kick questionnaire    Fear of Current or Ex-Partner: No    Emotionally Abused: No    Physically Abused: No    Sexually Abused: No    PHYSICAL EXAM  There were no vitals filed for this visit.  There is no height or weight on file to calculate BMI.  Generalized: Well developed, in no acute distress  Neurological examination  Mentation: Alert oriented to  time, place, history taking. Follows all commands speech and language fluent Cranial nerve II-XII: Pupils were equal round reactive to light. Extraocular movements were full, visual field were full on confrontational test. Facial sensation and strength were normal. Head turning and shoulder shrug  were normal and symmetric. Motor: The motor testing reveals 5 over 5 strength of all 4 extremities. Good symmetric motor tone is noted throughout.  Sensory: Sensory testing is intact to soft touch on all 4 extremities. No evidence of extinction is noted.  Coordination: Cerebellar testing reveals good finger-nose-finger and heel-to-shin bilaterally.  Gait and station: Gait is normal. Tandem gait is slightly unsteady. Reflexes: Deep tendon reflexes are symmetric and normal bilaterally.   DIAGNOSTIC DATA (LABS, IMAGING, TESTING) - I reviewed patient records, labs, notes, testing and imaging myself where available.  Lab Results  Component Value Date   WBC 5.4 01/28/2022   HGB 13.7 01/28/2022   HCT 40.8 01/28/2022   MCV 92.7 01/28/2022   PLT 235.0 01/28/2022      Component Value Date/Time   NA 138 09/10/2022 1417   NA 142 04/01/2022 0924   K 4.1 09/10/2022 1417   CL 101 09/10/2022 1417   CO2 32 09/10/2022 1417   GLUCOSE 95 09/10/2022 1417   BUN 11 09/10/2022 1417   BUN 11 04/01/2022 0924   CREATININE 0.66 09/10/2022 1417   CREATININE 0.63 04/13/2018 0741   CALCIUM 9.5 09/10/2022 1417  PROT 6.5 04/01/2022 0924   ALBUMIN 4.7 09/10/2022 1417   ALBUMIN 4.5 04/01/2022 0924   AST 22 04/01/2022 0924   ALT 16 04/01/2022 0924   ALKPHOS 96 04/01/2022 0924   BILITOT 0.5 04/01/2022 0924   GFRNONAA >60 11/04/2017 1311   GFRAA >60 11/04/2017 1311   Lab Results  Component Value Date   CHOL 188 06/16/2021   HDL 59.90 06/16/2021   LDLCALC 115 (H) 06/16/2021   TRIG 68.0 06/16/2021   CHOLHDL 3 06/16/2021   No results found for: "HGBA1C" No results found for: "VITAMINB12" Lab Results  Component  Value Date   TSH 2.80 09/10/2022    Margie Ege, AGNP-C, DNP 04/06/2023, 3:06 PM [facility] [address], [address] [place], [place] [address] [phone]

## 2023-04-07 ENCOUNTER — Encounter: Payer: Self-pay | Admitting: Neurology

## 2023-04-07 ENCOUNTER — Ambulatory Visit: Payer: Medicare Other | Admitting: Neurology

## 2023-04-07 VITALS — BP 100/62 | HR 94 | Ht 65.0 in | Wt 189.0 lb

## 2023-04-07 DIAGNOSIS — G40209 Localization-related (focal) (partial) symptomatic epilepsy and epileptic syndromes with complex partial seizures, not intractable, without status epilepticus: Secondary | ICD-10-CM

## 2023-04-09 ENCOUNTER — Encounter: Payer: Self-pay | Admitting: Neurology

## 2023-04-09 LAB — LAMOTRIGINE LEVEL: Lamotrigine Lvl: 8.2 ug/mL (ref 2.0–20.0)

## 2023-04-09 LAB — LEVETIRACETAM LEVEL: Levetiracetam Lvl: 20.9 ug/mL (ref 10.0–40.0)

## 2023-04-14 ENCOUNTER — Ambulatory Visit: Payer: Medicare Other | Admitting: Neurology

## 2023-04-14 DIAGNOSIS — G40209 Localization-related (focal) (partial) symptomatic epilepsy and epileptic syndromes with complex partial seizures, not intractable, without status epilepticus: Secondary | ICD-10-CM | POA: Diagnosis not present

## 2023-05-06 NOTE — Procedures (Signed)
     HISTORY: 72 year old female history of complex partial seizure,  TECHNIQUE:  This is a routine 16 channel EEG recording with one channel devoted to a limited EKG recording.  It was performed during wakefulness, drowsiness and asleep.  Hyperventilation and photic stimulation were performed as activating procedures.  There are minimum muscle and movement artifact noted.  Upon maximum arousal, posterior dominant waking rhythm consistent of rhythmic alpha range activity. Activities are symmetric over the bilateral posterior derivations and attenuated with eye opening.  Photic stimulation did not alter the tracing.  Hyperventilation produced mild/moderate buildup with higher amplitude and the slower activities noted.  During EEG recording, patient developed drowsiness and no deeper stage of sleep was achieved,  During EEG recording, there was rare P7 sharp transient  EKG demonstrate normal sinus rhythm.  CONCLUSION: This is a  normal awake EEG.  There is no electrodiagnostic evidence of epileptiform discharge.  Levert Feinstein, M.D. Ph.D.  [facility] [address] [place], [place] [address] Phone: [phone] Fax:      [phone]

## 2023-05-13 ENCOUNTER — Encounter: Payer: Self-pay | Admitting: Neurology

## 2023-05-13 NOTE — Telephone Encounter (Signed)
Called pt scheduled her to see Maralyn Sago on 7/30 @ 12:45 pm.

## 2023-05-19 ENCOUNTER — Other Ambulatory Visit: Payer: Self-pay | Admitting: Neurology

## 2023-05-19 DIAGNOSIS — G40209 Localization-related (focal) (partial) symptomatic epilepsy and epileptic syndromes with complex partial seizures, not intractable, without status epilepticus: Secondary | ICD-10-CM

## 2023-05-20 ENCOUNTER — Encounter (HOSPITAL_BASED_OUTPATIENT_CLINIC_OR_DEPARTMENT_OTHER): Payer: Self-pay | Admitting: Emergency Medicine

## 2023-05-20 ENCOUNTER — Emergency Department (HOSPITAL_BASED_OUTPATIENT_CLINIC_OR_DEPARTMENT_OTHER)
Admission: EM | Admit: 2023-05-20 | Discharge: 2023-05-20 | Disposition: A | Discharge status: Home / Self Care | Payer: Medicare Other | Attending: Emergency Medicine | Admitting: Emergency Medicine

## 2023-05-20 ENCOUNTER — Emergency Department (HOSPITAL_BASED_OUTPATIENT_CLINIC_OR_DEPARTMENT_OTHER): Payer: Medicare Other | Admitting: Radiology

## 2023-05-20 ENCOUNTER — Other Ambulatory Visit (HOSPITAL_BASED_OUTPATIENT_CLINIC_OR_DEPARTMENT_OTHER): Payer: Self-pay

## 2023-05-20 DIAGNOSIS — R072 Precordial pain: Secondary | ICD-10-CM | POA: Diagnosis not present

## 2023-05-20 DIAGNOSIS — Z7982 Long term (current) use of aspirin: Secondary | ICD-10-CM | POA: Insufficient documentation

## 2023-05-20 DIAGNOSIS — E782 Mixed hyperlipidemia: Secondary | ICD-10-CM | POA: Insufficient documentation

## 2023-05-20 DIAGNOSIS — R079 Chest pain, unspecified: Secondary | ICD-10-CM | POA: Diagnosis present

## 2023-05-20 LAB — BASIC METABOLIC PANEL
Anion gap: 7 (ref 5–15)
BUN: 12 mg/dL (ref 8–23)
CO2: 31 mmol/L (ref 22–32)
Calcium: 9.8 mg/dL (ref 8.9–10.3)
Chloride: 101 mmol/L (ref 98–111)
Creatinine, Ser: 0.58 mg/dL (ref 0.44–1.00)
GFR, Estimated: >60 mL/min (ref >60)
Glucose, Bld: 77 mg/dL (ref 70–99)
Potassium: 4.1 mmol/L (ref 3.5–5.1)
Sodium: 139 mmol/L (ref 135–145)

## 2023-05-20 LAB — CBC
HCT: 42.7 % (ref 36.0–46.0)
Hemoglobin: 14.3 g/dL (ref 12.0–15.0)
MCH: 31.7 pg (ref 26.0–34.0)
MCHC: 33.5 g/dL (ref 30.0–36.0)
MCV: 94.7 fL (ref 80.0–100.0)
Platelets: 234 10*3/uL (ref 150–400)
RBC: 4.51 MIL/uL (ref 3.87–5.11)
RDW: 12.8 % (ref 11.5–15.5)
WBC: 4.9 10*3/uL (ref 4.0–10.5)
nRBC: 0 % (ref 0.0–0.2)

## 2023-05-20 LAB — TROPONIN I (HIGH SENSITIVITY)
Troponin I (High Sensitivity): 3 ng/L (ref <18)
Troponin I (High Sensitivity): 4 ng/L (ref <18)

## 2023-05-20 NOTE — ED Triage Notes (Addendum)
Pt presents to ED POV. Pt c/o intermittent R CP that radiates to R arm. Pt reports that pain began at rest. Pain is a 3/10. No cardiac hx

## 2023-05-20 NOTE — ED Notes (Signed)
 RN reviewed discharge instructions with pt. Pt verbalized understanding and had no further questions. VSS upon discharge.  

## 2023-05-20 NOTE — Discharge Instructions (Signed)
You were seen in the emergency department today for chest pain.  As we discussed your lab work, EKG, chest x-ray all looked reassuring today.  I think that your symptoms could likely been related to muscle soreness.   However, given your high lipid levels and comorbid risk factors, I recommend you see a cardiologist.  They should call you in the next few days to schedule an appointment.  If they do not, please call the number on the paperwork to schedule an appointment.  I recommend monitoring your stress levels.  Continue to monitor how you are doing overall, and return to the emergency department for any new or worsening symptoms such as: Worsening pain or pain with exertion, difficulty breathing, sweating, or pain or swelling in your legs.

## 2023-05-20 NOTE — ED Provider Notes (Signed)
[place] EMERGENCY DEPARTMENT AT [facility] Provider Note   CSN: [id number] Arrival date & time: 05/20/23  1053     History  Chief Complaint  Patient presents with   Chest Pain    Summer [name] is a 72 y.o. female.  Patient with history of hyperlipidemia and anemia presents today with complaints of chest pain. She states that same began around 11 am this morning and is right sided in nature underneath her right breast and does not radiate. She denies any history of similar symptoms previously. Denies any shortness of breath, diaphoresis, nausea, or vomiting. No cardiac history but does state that she had some calcifications on a recent coronary CT. She does not smoke. Nothing makes her symptoms better or worse. She does note that she is extremely active and exercises several times a day so wonders if her symptoms are muscular but wasnt sure. Denies any leg pain or leg swelling, recent travel, or recent surgeries.  The history is provided by the patient. No language interpreter was used.  Chest Pain      Home Medications Prior to Admission medications   Medication Sig Start Date End Date Taking? Authorizing Provider  Ascorbic Acid (VITAMIN C) 100 MG tablet Take 100 mg by mouth daily.    [provider]  aspirin EC 81 MG tablet Take 81 mg by mouth daily. Swallow whole.    [provider]  Calcium-Vitamin D-Vitamin K [id number] MG-UNT-MCG TABS Take 1 tablet by mouth 2 (two) times daily. Also includes Vitamin C    [provider]  Cholecalciferol 100 MCG (4000 UT) CAPS Take 4,000 Units by mouth daily.    [provider]  Cyanocobalamin (VITAMIN B 12 PO) Take 1 tablet by mouth daily.    [provider]  denosumab (PROLIA) 60 MG/ML SOLN injection Inject 60 mg into the skin every 6 (six) months. Administer in upper arm, thigh, or abdomen    [provider]  EPINEPHrine 0.3 mg/0.3 mL IJ SOAJ injection Inject 0.3 mLs (0.3  mg total) into the muscle as needed for anaphylaxis. 05/01/19   Jarold Motto, PA  estradiol (ESTRACE) 0.1 MG/GM vaginal cream Apply 1/2 gram to vagina/vulva nightly x two weeks, then reduce to 1/2 gm to vagina/vulva 2-3 times a week. 11/25/22   Patton Salles, MD  FEROSUL 325 (65 Fe) MG tablet TAKE ONE TABLET BY MOUTH DAILY WITH VITAMIN C. 04/21/22   Allwardt, Alyssa M, PA-C  lamoTRIgine (LAMICTAL) 150 MG tablet TAKE 1 TABLET BY MOUTH TWICE A DAY 05/19/23   Glean Salvo, NP  levETIRAcetam (KEPPRA) 500 MG tablet Take 1 tablet twice daily 04/01/22   Glean Salvo, NP  nystatin cream (MYCOSTATIN) Apply 1 Application topically 2 (two) times daily. 07/10/22   Allwardt, Crist Infante, PA-C  omega-3 acid ethyl esters (LOVAZA) 1 g capsule Take 1 g by mouth daily.    [provider]  rosuvastatin (CRESTOR) 20 MG tablet Take 1 tablet (20 mg total) by mouth daily. 07/10/22 07/05/23  Hilty, Lisette Abu, MD  triamcinolone (KENALOG) 0.025 % ointment Apply 1 Application topically 2 (two) times daily. Use twice daily for 1 - 2 weeks as needed. 09/07/22   Patton Salles, MD  Turmeric (QC TUMERIC COMPLEX PO) Take 1 tablet by mouth daily.    [provider]  UNABLE TO FIND Take 1 tablet by mouth 2 (two) times daily. Calcium magnesium intensive care    [provider]  vitamin E 600 UNIT capsule Take 600 Units by mouth daily. Patient not taking: Reported on 04/07/2023    [provider]      Allergies    Bee venom    Review of Systems   Review of Systems  Cardiovascular:  Positive for chest pain.  All other systems reviewed and are negative.   Physical Exam Updated Vital Signs BP 122/69   Pulse (!) 57   Temp 98.1 F (36.7 C) (Oral)   Resp 19   SpO2 99%  Physical Exam Vitals and nursing note reviewed.  Constitutional:      General: She is not in acute distress.    Appearance: Normal appearance. She is normal weight. She is not ill-appearing,  toxic-appearing or diaphoretic.  HENT:     Head: Normocephalic and atraumatic.  Cardiovascular:     Rate and Rhythm: Normal rate and regular rhythm.     Pulses:          Radial pulses are 2+ on the right side and 2+ on the left side.       Dorsalis pedis pulses are 2+ on the right side and 2+ on the left side.       Posterior tibial pulses are 2+ on the right side and 2+ on the left side.     Heart sounds: Normal heart sounds.  Pulmonary:     Effort: Pulmonary effort is normal. No respiratory distress.     Breath sounds: Normal breath sounds.  Chest:     Comments: TTP under the right breast area. No overlying skin changes. Abdominal:     General: Abdomen is flat.     Palpations: Abdomen is soft.     Tenderness: There is no abdominal tenderness.  Musculoskeletal:        General: Normal range of motion.     Cervical back: Normal range of motion.     Right lower leg: No tenderness. No edema.     Left lower leg: No tenderness. No edema.  Skin:    General: Skin is warm and dry.     Findings: No rash.  Neurological:     General: No focal deficit present.     Mental Status: She is alert.  Psychiatric:        Mood and Affect: Mood normal.        Behavior: Behavior normal.     ED Results / Procedures / Treatments   Labs (all labs ordered are listed, but only abnormal results are displayed) Labs Reviewed  BASIC METABOLIC PANEL  CBC  TROPONIN I (HIGH SENSITIVITY)  TROPONIN I (HIGH SENSITIVITY)    EKG EKG Interpretation Date/Time:  Thursday May 20 2023 10:59:23 EDT Ventricular Rate:  66 PR Interval:  118 QRS Duration:  90 QT Interval:  414 QTC Calculation: 434 R Axis:   74  Text Interpretation: Normal sinus rhythm Nonspecific ST abnormality Abnormal ECG When compared with ECG of 04-Nov-2017 13:23, PREVIOUS ECG IS PRESENT No acute changes Confirmed by Derwood Kaplan ([id number]) on 05/20/2023 11:59:39 AM  Radiology DG Chest 2 View  Result Date: 05/20/2023 CLINICAL DATA:   72 year old female with chest pain radiating to the right arm. EXAM: CHEST - 2 VIEW COMPARISON:  Cardiac CT 03/10/2022. FINDINGS: Normal lung volumes on the PA, somewhat low on the lateral. Normal cardiac size and mediastinal contours. Visualized tracheal air column is within normal limits. Both lungs appear clear. No pneumothorax or pleural effusion. Chronic left upper quadrant surgical clips. Negative visible bowel  gas. No acute osseous abnormality identified. IMPRESSION: No acute cardiopulmonary abnormality. Electronically Signed   By: Odessa Fleming M.D.   On: 05/20/2023 11:39    Procedures Procedures    Medications Ordered in ED Medications - No data to display  ED Course/ Medical Decision Making/ A&P                             Medical Decision Making Amount and/or Complexity of Data Reviewed Labs: ordered. Radiology: ordered.   This patient is a 72 y.o. female who presents to the ED for concern of chest pain, this involves an extensive number of treatment options, and is a complaint that carries with it a high risk of complications and morbidity. The emergent differential diagnosis prior to evaluation includes, but is not limited to,  ACS, pericarditis, myocarditis, aortic dissection, PE, pneumothorax, esophageal spasm or rupture, chronic angina, pneumonia, bronchitis, GERD, reflux/PUD, biliary disease, pancreatitis, costochondritis, anxiety   This is not an exhaustive differential.   Past Medical History / Co-morbidities / Social History: history of hyperlipidemia and anemia  Additional history: Chart reviewed. Pertinent results include: patient with high coronary calcium score on CT chest on 03/10/22  Physical Exam: Physical exam performed. The pertinent findings include: TTP under the right breast. No RUQ pain.   Lab Tests: I ordered, and personally interpreted labs.  The pertinent results include:  Delta trop WNL. No acute laboratory abnormalities.    Imaging Studies: I ordered  imaging studies including CXR. I independently visualized and interpreted imaging which showed NAD. I agree with the radiologist interpretation.   Cardiac Monitoring:  The patient was maintained on a cardiac monitor.  My attending physician Dr. Rhunette Croft viewed and interpreted the cardiac monitored which showed an underlying rhythm of: no STEMI. I agree with this interpretation.   Disposition: After consideration of the diagnostic results and the patients response to treatment, I feel that emergency department workup does not suggest an emergent condition requiring admission or immediate intervention beyond what has been performed at this time. The plan is: discharge with cardiology referral and close outpatient follow-up with return precautions. Pain is mild and does not seem cardiac in nature. Work-up benign. Pain improved upon reassessment. HEART score 3, low risk.  No signs or symptoms to suggest PE or dissection.  Evaluation and diagnostic testing in the emergency department does not suggest an emergent condition requiring admission or immediate intervention beyond what has been performed at this time.  Plan for discharge with close PCP follow-up.  Patient is understanding and amenable with plan, educated on red flag symptoms that would prompt immediate return.  Patient discharged in stable condition.   This is a shared visit with supervising physician Dr. Rhunette Croft who has independently evaluated patient & provided guidance in evaluation/management/disposition, in agreement with care    Final Clinical Impression(s) / ED Diagnoses Final diagnoses:  Precordial chest pain  Mixed hyperlipidemia    Rx / DC Orders ED Discharge Orders          Ordered    Ambulatory referral to Cardiology       Comments: If you have not heard from the Cardiology office within the next 72 hours please call [phone].   05/20/23 1532          An After Visit Summary was printed and given to the  patient.     Vear Clock 05/20/23 1534    Derwood Kaplan, MD 05/21/23 [phone]

## 2023-05-25 ENCOUNTER — Encounter: Payer: Self-pay | Admitting: Neurology

## 2023-05-25 ENCOUNTER — Ambulatory Visit: Payer: Medicare Other | Admitting: Neurology

## 2023-05-25 VITALS — BP 122/78 | HR 74 | Ht 65.0 in | Wt 193.5 lb

## 2023-05-25 DIAGNOSIS — G40209 Localization-related (focal) (partial) symptomatic epilepsy and epileptic syndromes with complex partial seizures, not intractable, without status epilepticus: Secondary | ICD-10-CM

## 2023-05-25 MED ORDER — LAMOTRIGINE 200 MG PO TABS
200.0000 mg | ORAL_TABLET | Freq: Two times a day (BID) | ORAL | 3 refills | Status: DC
Start: 2023-05-25 — End: 2024-05-25
  Filled 2024-03-18 – 2024-05-12 (×2): qty 180, 90d supply, fill #0

## 2023-05-25 NOTE — Patient Instructions (Addendum)
Increase Lamictal to 200 mg twice daily x 1 week, then decrease Keppra to 250 mg mg AM/500 mg PM x 2 weeks, then 250 mg BID for 2 weeks, then stop Keppra  Call for any seizures, anything concerning

## 2023-05-25 NOTE — Progress Notes (Signed)
Patient: Summer [name] Date of Birth: 02-17-1951  Reason for Visit: Follow up for seizure History from: Patient Primary Neurologist: Dr.Yan  ASSESSMENT AND PLAN 72 y.o. year old female   1.  Complex partial seizure with secondary generalization, last seizure was in 2016  -Simplify seizure medication regimen due to no seizures since 2016, no longer drinking alcohol, under treatment for osteoporosis on Prolia to minimize potential impact of dual seizure medication on bone loss  -Increase Lamictal 200 mg twice daily x 1 week continue Keppra 500 mg twice daily, then decrease Keppra 250 mg AM/500 mg PM x 2 weeks, then decrease Keppra 250 mg BID x 2 weeks, then stop Keppra, continue on Lamictal 200 mg twice daily as single agent -EEG normal June 2024 -Reviewed plan with Dr. Terrace Arabia after EEG resulted, Lamictal felt to be superior seizure medication -Call for seizure activity, otherwise keep follow-up appointment in December with me  HISTORY  Summer [name] is a 72 year old female, seen in refer by her primary care doctor Helane Rima, for evaluation of seizure, facial paresthesia, initial evaluation was on January 18, 2018.   She had a history of osteoporosis, is taking Prolia 60 mg every 6 months   History of complex partial seizure with secondary generalization   I reviewed previous neurology evaluation from [facility] clinic by Dr.Jehi   First seizure was in the labor room at age 1 after delivery, no warning signs,   Around 2015, she started binge drink for 3-4 years, she had recurrent seizure in February 2015, patient was amnestic of the event, husband got a call from her primary care physician the patient had a seizure during her routine appointment, with tongue biting, was admitted to [facility],     CT, MRI of the brain with and without contrast showed mild supratentorium small vessel disease, EEG showed no acute abnormality, no antiepileptic medication was given,  record mentions that was thought to be due to alcohol withdrawal, patient stated that she was drinking heavily, hard liquor on a daily basis, but did stop drinking for weeks prior to the event in February 2015.   Second event July 2015, was at a work picnic, then woke up on the hospital, was witnessed by a physician there that she had a seizure, loss of consciousness with tonic-clonic activity of the extremities that lasted 5 minutes, followed by 30-60 minutes post ictal confusion, Keppra was started at 750 twice a day, later was increased to 1000 mg twice a day later, patient complains of feeling sleepy,   Third event was in [place], she was witnessed by her friend that she has transient episode of confusion,   Her medication was later changed to lamotrigine 150 mg twice a day, Keppra 500 mg twice daily, stayed on that regimen since, she is tolerating the medication well, there was no recurrent seizures.   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   Today she also complains of more than 3 years history of constant right facial paresthesia, burning hypersensitivity, mild asymmetry of the face, she noticed more wrinkles in her left lower face while woke up in the morning time.   Laboratory evaluation in January 2019 showed mild elevated LDL 101, negative troponin, INR 1.01, d-dimer was mildly elevated at 0.95, negative troponin, normal BMP, CBC    UPDATE Sept 5 2019: She is  doing very well, had no recurrent seizure, has quit drinking. Now taking keppra 500mg  bid and lamotrigine 150mg  bid.    We personally reviewed MRI brain w/wo in May 2019, there are chronic small vessel disease, no acute abnormalities.   EEG was normal in June 2019   Update November 13, 2019 SS: Since last seen, she remained on  both Lamictal and Keppra, she was nervous to come off the Lamictal quickly, after speaking with [facility] clinic she decided to remain on dual therapy.  She has continued to do well, has not had recurrent seizure.  She is open to coming off the Lamictal, if done slowly.  She has continued right-sided facial paresthesia, pressure to right face, some asymmetry on the right x 3 years.  Overall, has been doing well, has her grandson with her today.    Update November 14, 2020 SS: Here today for follow-up via virtual visit, continues to do well, no recurrent seizures (last in 2016ish).  Has decided to remain on Keppra 500 mg twice a day, Lamictal 150 mg twice a day.  Previously when she saw Dr. Terrace Arabia in 2019, decided to taper off Lamictal, do higher dose Keppra 500/1000.  She felt more comfortable with dual treatment, worried she would have a seizure, lose her driver's license in [place]. Moved from [place]. She takes care of her 2 grandkids during the day.  Is on vitamin D supplement.  Doing well, sees Dr. Artis Flock PCP.   Update November 19, 2021 SS: Here today via telephone visit, couldn't connect via my chart. Couldn't come into the office due to COVID exposure. Health is good. Going to the gym 4-5 days a week taking aerobics class. No seizures. Last was in 2015. Hasn't wanted to reduce AED to single treatment.  Takes care of her grandchildren. Remains on Vitamin D, drives. On oral iron.   Update 04/01/22 SS: Doing well, remains on Keppra 500 mg twice a day, Lamictal 150 mg twice daily.  Has decided to stay on dual treatment, even though in 2019 plan was to increase Keppra 500/1000 mg daily, taper off Lamictal.  She cares for her to prevent she is on Prolia, takes vitamin D.  She is very active, goes to the gym.  Has no adverse effects from AED.   Update April 07, 2023 SS: Doing water fit classes 5 days a week, swimming lessons, goes to [employer].  No seizures since 2016.  She remains on Keppra 500 mg twice a day, Lamictal 150 mg  twice daily.  She would like to discuss reducing or moving to single agent.  Update May 25, 2023 SS: Here to discuss moving to single agent seizure medication.  Currently on Keppra 500 mg twice daily, Lamictal 150 mg twice daily.  Last seizure was in 2016.  All her seizures have occurred when drinking alcohol.  Since 2016 has been committed to her sobriety.  She currently has osteoporosis.  EEG was normal June 2024.  Lamictal level 8.2.  REVIEW OF SYSTEMS: Out of a complete 14 system review of symptoms, the patient complains only of the following symptoms, and all other reviewed systems are negative.  See HPI  ALLERGIES: Allergies  Allergen Reactions   Bee Venom Swelling    HOME MEDICATIONS: Outpatient Medications Prior to Visit  Medication Sig Dispense Refill   Ascorbic Acid (VITAMIN C) 100 MG tablet Take 100 mg by mouth daily.     aspirin EC 81 MG tablet Take 81 mg by mouth daily. Swallow whole.  Calcium-Vitamin D-Vitamin K [phone] MG-UNT-MCG TABS Take 1 tablet by mouth 2 (two) times daily. Also includes Vitamin C     Cholecalciferol 100 MCG (4000 UT) CAPS Take 4,000 Units by mouth daily.     Cyanocobalamin (VITAMIN B 12 PO) Take 1 tablet by mouth daily.     denosumab (PROLIA) 60 MG/ML SOLN injection Inject 60 mg into the skin every 6 (six) months. Administer in upper arm, thigh, or abdomen     EPINEPHrine 0.3 mg/0.3 mL IJ SOAJ injection Inject 0.3 mLs (0.3 mg total) into the muscle as needed for anaphylaxis. 2 each 1   estradiol (ESTRACE) 0.1 MG/GM vaginal cream Apply 1/2 gram to vagina/vulva nightly x two weeks, then reduce to 1/2 gm to vagina/vulva 2-3 times a week. 42.5 g 1   FEROSUL 325 (65 Fe) MG tablet TAKE ONE TABLET BY MOUTH DAILY WITH VITAMIN C. 30 tablet 2   lamoTRIgine (LAMICTAL) 150 MG tablet TAKE 1 TABLET BY MOUTH TWICE A DAY 180 tablet 4   levETIRAcetam (KEPPRA) 500 MG tablet Take 1 tablet twice daily 180 tablet 4   nystatin cream (MYCOSTATIN) Apply 1 Application  topically 2 (two) times daily. 30 g 0   omega-3 acid ethyl esters (LOVAZA) 1 g capsule Take 1 g by mouth daily.     rosuvastatin (CRESTOR) 20 MG tablet Take 1 tablet (20 mg total) by mouth daily. 90 tablet 3   triamcinolone (KENALOG) 0.025 % ointment Apply 1 Application topically 2 (two) times daily. Use twice daily for 1 - 2 weeks as needed. 30 g 0   Turmeric (QC TUMERIC COMPLEX PO) Take 1 tablet by mouth daily.     UNABLE TO FIND Take 1 tablet by mouth 2 (two) times daily. Calcium magnesium intensive care     vitamin E 600 UNIT capsule Take 600 Units by mouth daily. (Patient not taking: Reported on 04/07/2023)     No facility-administered medications prior to visit.    PAST MEDICAL HISTORY: Past Medical History:  Diagnosis Date   Adenomatous polyp of sigmoid colon 03/18/2017   Overview:  Added automatically from request for surgery [phone]   Alcoholism (HCC)    01/18/18 - no use in 5 years   Asymptomatic varicose veins 01/11/2017   Cancer (HCC)    skin ca   Iron deficiency anemia 01/12/2017   Last Assessment & Plan:  Given script for ferrous sulfate and order for repeat cbc with diff and iron studies to be done in 1 month Will also refer pt back to [facility] for repeat colonoscopy and to dietician for nutritional counseling, per pt request   Lentigines 01/11/2017   Melanocytic nevi of left lower limb, including hip 01/11/2017   Melanocytic nevi of left upper limb, including shoulder 01/11/2017   Melanocytic nevi of right lower limb, including hip 01/11/2017   Melanocytic nevi of right upper limb, including shoulder 01/11/2017   Melanocytic nevi of trunk 01/11/2017   Obesity (BMI 30-39.9) 05/21/2017   Osteoporosis    Plantar fasciitis    Primary osteoarthritis, right shoulder 12/11/2016   Seizure (HCC)    LAST SEIZURE- 2016 per pt   Sleep apnea    no cpap per pt   Statins contraindicated 10/2022   Unilateral primary osteoarthritis, left knee 12/11/2016    PAST SURGICAL HISTORY: Past  Surgical History:  Procedure Laterality Date   BARIATRIC SURGERY     COLONOSCOPY  03/18/2017   in [place].   ECTOPIC PREGNANCY SURGERY     POLYPECTOMY  SIGMOIDOSCOPY     in [place].   TOTAL KNEE ARTHROPLASTY Left 09/07/2017   Procedure: LEFT TOTAL KNEE ARTHROPLASTY;  Surgeon: Durene Romans, MD;  Location: [facility];  Service: Orthopedics;  Laterality: Left;  Adductor Block    FAMILY HISTORY: Family History  Problem Relation Age of Onset   Mitral valve prolapse Mother    Osteoporosis Mother    Memory loss Mother    Colon cancer Father 9   Alcoholism Father    Colon polyps Neg Hx    Esophageal cancer Neg Hx    Rectal cancer Neg Hx    Stomach cancer Neg Hx     SOCIAL HISTORY: Social History   Socioeconomic History   Marital status: Married    Spouse name: Not on file   Number of children: 1   Years of education: college   Highest education level: Bachelor's degree (e.g., BA, AB, BS)  Occupational History   Occupation: Retired  Tobacco Use   Smoking status: Never   Smokeless tobacco: Never  Vaping Use   Vaping status: Never Used  Substance and Sexual Activity   Alcohol use: No   Drug use: No   Sexual activity: Never    Birth control/protection: Post-menopausal  Other Topics Concern   Not on file  Social History Narrative   Lives at home with husband.   Right-handed.   3 cups caffeine per day.   Cares for 2 grandchildren 3 days per week    Social Determinants of Health   Financial Resource Strain: Low Risk  (08/13/2022)   Overall Financial Resource Strain (CARDIA)    Difficulty of Paying Living Expenses: Not hard at all  Food Insecurity: No Food Insecurity (08/13/2022)   Hunger Vital Sign    Worried About Running Out of Food in the Last Year: Never true    Ran Out of Food in the Last Year: Never true  Transportation Needs: No Transportation Needs (08/13/2022)   PRAPARE - Administrator, Civil Service (Medical): No    Lack of Transportation  (Non-Medical): No  Physical Activity: Sufficiently Active (08/13/2022)   Exercise Vital Sign    Days of Exercise per Week: 4 days    Minutes of Exercise per Session: 50 min  Stress: No Stress Concern Present (08/13/2022)   [employer] of Occupational Health - Occupational Stress Questionnaire    Feeling of Stress : Not at all  Social Connections: Moderately Integrated (08/13/2022)   Social Connection and Isolation Panel [NHANES]    Frequency of Communication with Friends and Family: More than three times a week    Frequency of Social Gatherings with Friends and Family: More than three times a week    Attends Religious Services: Never    Database administrator or Organizations: Yes    Attends Banker Meetings: 1 to 4 times per year    Marital Status: Married  Catering manager Violence: Not At Risk (08/13/2022)   Humiliation, Afraid, Rape, and Kick questionnaire    Fear of Current or Ex-Partner: No    Emotionally Abused: No    Physically Abused: No    Sexually Abused: No   PHYSICAL EXAM  There were no vitals filed for this visit.   There is no height or weight on file to calculate BMI.  Generalized: Well developed, in no acute distress  Neurological examination  Mentation: Alert oriented to time, place, history taking. Follows all commands speech and language fluent Cranial nerve II-XII: Pupils were equal  round reactive to light. Extraocular movements were full, visual field were full on confrontational test. Facial sensation and strength were normal. Head turning and shoulder shrug  were normal and symmetric. Motor: The motor testing reveals 5 over 5 strength of all 4 extremities. Good symmetric motor tone is noted throughout.  Sensory: Sensory testing is intact to soft touch on all 4 extremities. No evidence of extinction is noted.  Coordination: Cerebellar testing reveals good finger-nose-finger and heel-to-shin bilaterally.  Gait and station: Gait is normal.   Reflexes: Deep tendon reflexes are symmetric and normal bilaterally.   DIAGNOSTIC DATA (LABS, IMAGING, TESTING) - I reviewed patient records, labs, notes, testing and imaging myself where available.  Lab Results  Component Value Date   WBC 4.9 05/20/2023   HGB 14.3 05/20/2023   HCT 42.7 05/20/2023   MCV 94.7 05/20/2023   PLT 234 05/20/2023      Component Value Date/Time   NA 139 05/20/2023 1108   NA 142 04/01/2022 0924   K 4.1 05/20/2023 1108   CL 101 05/20/2023 1108   CO2 31 05/20/2023 1108   GLUCOSE 77 05/20/2023 1108   BUN 12 05/20/2023 1108   BUN 11 04/01/2022 0924   CREATININE 0.58 05/20/2023 1108   CREATININE 0.63 04/13/2018 0741   CALCIUM 9.8 05/20/2023 1108   PROT 6.5 04/01/2022 0924   ALBUMIN 4.7 09/10/2022 1417   ALBUMIN 4.5 04/01/2022 0924   AST 22 04/01/2022 0924   ALT 16 04/01/2022 0924   ALKPHOS 96 04/01/2022 0924   BILITOT 0.5 04/01/2022 0924   GFRNONAA >60 05/20/2023 1108   GFRAA >60 11/04/2017 1311   Lab Results  Component Value Date   CHOL 188 06/16/2021   HDL 59.90 06/16/2021   LDLCALC 115 (H) 06/16/2021   TRIG 68.0 06/16/2021   CHOLHDL 3 06/16/2021   No results found for: "HGBA1C" No results found for: "VITAMINB12" Lab Results  Component Value Date   TSH 2.80 09/10/2022    Margie Ege, AGNP-C, DNP 05/25/2023, 11:30 AM [facility] [address] [place], [place] [address] [phone]

## 2023-05-27 ENCOUNTER — Ambulatory Visit: Payer: Medicare Other | Admitting: Physician Assistant

## 2023-05-28 ENCOUNTER — Encounter: Payer: Self-pay | Admitting: Neurology

## 2023-06-01 ENCOUNTER — Encounter: Payer: Self-pay | Admitting: Internal Medicine

## 2023-06-01 NOTE — Progress Notes (Signed)
Chart reviewed, agree above plan ?

## 2023-06-03 NOTE — Progress Notes (Signed)
Virtual Visit via Video Note  I connected with Summer [name] on 06/07/2023 at 7:30 AM  by a video enabled telemedicine application and verified that I am speaking with the correct person using two identifiers.   I discussed the limitations of evaluation and management by telemedicine and the availability of in person appointments. The patient expressed understanding and agreed to proceed.   -Location of the patient : -Location of the provider : Office -The names of all persons participating in the telemedicine service : Pt and myself       Name: Summer [name]  MRN/ DOB: [id number], 1951/05/12    Age/ Sex: 72 y.o., female    PCP: Allwardt, Crist Infante, PA-C   Reason for Endocrinology Evaluation: Osteoporosis      Date of Initial Endocrinology Evaluation: 09/10/2022    HPI: [name] is a 72 y.o. female with a past medical history of Dyslipidemia, seizure d/o and osteopenia , S/P gastric bypass 2008, recovering alcoholic . The patient presented for initial endocrinology clinic visit on 09/10/2022 for consultative assistance with her Osteoporosis  Pt was diagnosed with Osteoporosis : unknown   Menarche at age : 75 Menopausal at age : 55's. Fracture Hx: none as an adult  Hx of HRT: no FH of osteoporosis or hip fracture: mother and sister  Prior Hx of anti-resorptive therapy : Has been on Prolia : not sure but it has appeared on her med list in 2018.  She did not receive her first injection of Prolia through our system until February 2019  On her initial visit with me I recommended completing Prolia course as she has been on it for approximately 5 years, I did recommend Reclast infusion, but the patient message back stating she wanted to postpone.  She was advised regarding the rebound phenomena of not taking a bridging medication after discontinuation of Prolia.  She was also advised to switch calcium carbonate to citrate due to history of gastric  bypass   SUBJECTIVE:    Today (06/03/23):  Ms. Ulatowski is here for a follow up on osteoporosis.    She continues to follow-up with [facility] for complex partial seizures Vitamin D3 2000- Calcium 100  BID Calcium 60 mg  daily part of vitamin B12  Calcium citrate 1200 mg daily     HISTORY:  Past Medical History:  Past Medical History:  Diagnosis Date   Adenomatous polyp of sigmoid colon 03/18/2017   Overview:  Added automatically from request for surgery [id number]   Alcoholism (HCC)    01/18/18 - no use in 5 years   Asymptomatic varicose veins 01/11/2017   Cancer (HCC)    skin ca   Iron deficiency anemia 01/12/2017   Last Assessment & Plan:  Given script for ferrous sulfate and order for repeat cbc with diff and iron studies to be done in 1 month Will also refer pt back to [facility] for repeat colonoscopy and to dietician for nutritional counseling, per pt request   Lentigines 01/11/2017   Melanocytic nevi of left lower limb, including hip 01/11/2017   Melanocytic nevi of left upper limb, including shoulder 01/11/2017   Melanocytic nevi of right lower limb, including hip 01/11/2017   Melanocytic nevi of right upper limb, including shoulder 01/11/2017   Melanocytic nevi of trunk 01/11/2017   Obesity (BMI 30-39.9) 05/21/2017   Osteoporosis    Plantar fasciitis    Primary osteoarthritis, right shoulder 12/11/2016   Seizure (HCC)    LAST SEIZURE-  2016 per pt   Sleep apnea    no cpap per pt   Statins contraindicated 10/2022   Unilateral primary osteoarthritis, left knee 12/11/2016   Past Surgical History:  Past Surgical History:  Procedure Laterality Date   BARIATRIC SURGERY     COLONOSCOPY  03/18/2017   in [place].   ECTOPIC PREGNANCY SURGERY     POLYPECTOMY     SIGMOIDOSCOPY     in [place].   TOTAL KNEE ARTHROPLASTY Left 09/07/2017   Procedure: LEFT TOTAL KNEE ARTHROPLASTY;  Surgeon: Durene Romans, MD;  Location: [facility];  Service: Orthopedics;  Laterality:  Left;  Adductor Block    Social History:  reports that she has never smoked. She has never used smokeless tobacco. She reports that she does not drink alcohol and does not use drugs. Family History: family history includes Alcoholism in her father; Colon cancer (age of onset: 52) in her father; Memory loss in her mother; Mitral valve prolapse in her mother; Osteoporosis in her mother.   HOME MEDICATIONS: Allergies as of 06/04/2023       Reactions   Bee Venom Swelling        Medication List        Accurate as of June 03, 2023 12:32 PM. If you have any questions, ask your nurse or doctor.          aspirin EC 81 MG tablet Take 81 mg by mouth daily. Swallow whole.   Calcium-Vitamin D-Vitamin K [id number] MG-UNT-MCG Tabs Take 1 tablet by mouth 2 (two) times daily. Also includes Vitamin C   Cholecalciferol 100 MCG (4000 UT) Caps Take 4,000 Units by mouth daily.   denosumab 60 MG/ML Soln injection Commonly known as: PROLIA Inject 60 mg into the skin every 6 (six) months. Administer in upper arm, thigh, or abdomen   EPINEPHrine 0.3 mg/0.3 mL Soaj injection Commonly known as: EPI-PEN Inject 0.3 mLs (0.3 mg total) into the muscle as needed for anaphylaxis.   estradiol 0.1 MG/GM vaginal cream Commonly known as: ESTRACE Apply 1/2 gram to vagina/vulva nightly x two weeks, then reduce to 1/2 gm to vagina/vulva 2-3 times a week.   FeroSul 325 (65 Fe) MG tablet Generic drug: ferrous sulfate TAKE ONE TABLET BY MOUTH DAILY WITH VITAMIN C.   lamoTRIgine 200 MG tablet Commonly known as: LaMICtal Take 1 tablet (200 mg total) by mouth 2 (two) times daily.   levETIRAcetam 500 MG tablet Commonly known as: KEPPRA Take 1 tablet twice daily   nystatin cream Commonly known as: MYCOSTATIN Apply 1 Application topically 2 (two) times daily.   omega-3 acid ethyl esters 1 g capsule Commonly known as: LOVAZA Take 1 g by mouth daily.   QC TUMERIC COMPLEX PO Take 1 tablet by mouth  daily.   rosuvastatin 20 MG tablet Commonly known as: CRESTOR Take 1 tablet (20 mg total) by mouth daily.   triamcinolone 0.025 % ointment Commonly known as: KENALOG Apply 1 Application topically 2 (two) times daily. Use twice daily for 1 - 2 weeks as needed.   UNABLE TO FIND Take 1 tablet by mouth 2 (two) times daily. Calcium magnesium intensive care   VITAMIN B 12 PO Take 1 tablet by mouth daily.   vitamin C 100 MG tablet Take 100 mg by mouth daily.   vitamin E 600 UNIT capsule Take 600 Units by mouth daily.          REVIEW OF SYSTEMS: A comprehensive ROS was conducted with the patient and is negative  except as per HPI    OBJECTIVE:  VS: There were no vitals taken for this visit.   Wt Readings from Last 3 Encounters:  05/25/23 193 lb 8 oz (87.8 kg)  04/07/23 189 lb (85.7 kg)  01/18/23 197 lb (89.4 kg)     EXAM: General: Pt appears well and is in NAD  Neck: General: Supple without adenopathy. Thyroid: Thyroid size normal.  No goiter or nodules appreciated.   Lungs: Clear with good BS bilat   Heart: Auscultation: RRR.  Abdomen: Normoactive bowel sounds, soft, nontender  Extremities:  BL LE: No pretibial edema normal ROM and strength.  Mental Status: Judgment, insight: Intact Orientation: Oriented to time, place, and person Mood and affect: No depression, anxiety, or agitation     DATA REVIEWED:   Latest Reference Range & Units 05/20/23 11:08  Sodium 135 - 145 mmol/L 139  Potassium 3.5 - 5.1 mmol/L 4.1  Chloride 98 - 111 mmol/L 101  CO2 22 - 32 mmol/L 31  Glucose 70 - 99 mg/dL 77  BUN 8 - 23 mg/dL 12  Creatinine 4.16 - 6.06 mg/dL 3.01  Calcium 8.9 - 60.1 mg/dL 9.8  Anion gap 5 - 15  7  GFR, Estimated >60 mL/min >60     Latest Reference Range & Units 09/10/22 14:17  VITD 30.00 - 100.00 ng/mL 46.67    Latest Reference Range & Units 09/10/22 14:17  TSH 0.35 - 5.50 uIU/mL 2.80  T4,Free(Direct) 0.60 - 1.60 ng/dL 0.93   DXA  2/35/5732   ASSESSMENT: The BMD measured at Femur Neck Left is 0.705 g/cm2 with a T-score of -2.4. This patient is considered osteopenic/low bone mass according to World Health Organization [facility]) criteria.   The quality of the exam is good. L3, L4 was excluded due to degenerative changes. The patient does not meet FRAX criteria.   Site Region Measured Date Measured Age YA BMD Significant CHANGE T-score DualFemur Neck Left  12/10/2021    70.2         -2.4    0.705 g/cm2 DualFemur Neck Left  02/22/2020    68.4         -2.1    0.743 g/cm2   AP Spine  L1-L2      12/10/2021    70.2         -1.4    0.992 g/cm2 AP Spine  L1-L2      02/22/2020    68.4         -1.0    1.043 g/cm2   DualFemur Total Mean 12/10/2021 70.2 -1.5 0.814 g/cm2 * DualFemur Total Mean 02/22/2020    68.4         -1.0    0.882 g/cm2    Old records , labs and images have been reviewed.    ASSESSMENT/PLAN/RECOMMENDATIONS:   Hx of Osteoporosis :  -Patient is a poor historian and was unable to provide detailed history.but it appears that [place] .  She also does not recall when Prolia was started, in reviewing her chart Prolia was on her medication list from 2018, but she did not start taking it through our system until February 2019 -Her last 2 DXA scans show low bone density , with decrease in BMD over the past 2 years -Patient is s/p gastric bypass, I have discussed the importance of optimizing calcium intake and switching calcium carbonate to calcium citrate -Vitamin D is normal continue current regimen -I have recommended  completing Prolia for this year through her PCPs office.  As it appears she has been on it for at least 5 years.  I have recommended bisphosphonate therapy we discussed oral versus infusion.  Patient opted to receive zoledronic acid this year and will assess again next year  Medications : Switch calcium carbonate to calcium citrate 1200 mg  daily Continue vitamin D 2000 IU twice daily Complete Prolia 2023 Start zoledronic acid 5 mg IV once yearly     Signed electronically by: Lyndle Herrlich, MD  [facility]  [facility] [address] [place] [place], [place] [address] Phone: [phone] FAX: [phone]   CC: Allwardt, Crist Infante, PA-C [address] [place] [address] Phone: [phone] Fax: [phone]   Return to Endocrinology clinic as below: Future Appointments  Date Time Provider Department Center  06/04/2023  7:30 AM , Konrad Dolores, MD [facility] None  07/27/2023  2:00 PM Marykay Lex, MD [facility] None  10/13/2023  7:45 AM Glean Salvo, NP [facility] None

## 2023-06-04 ENCOUNTER — Encounter: Payer: Self-pay | Admitting: Internal Medicine

## 2023-06-04 ENCOUNTER — Telehealth: Payer: Medicare Other | Admitting: Internal Medicine

## 2023-06-04 VITALS — Ht 65.0 in

## 2023-06-20 ENCOUNTER — Encounter: Payer: Self-pay | Admitting: Physician Assistant

## 2023-06-24 ENCOUNTER — Other Ambulatory Visit (HOSPITAL_BASED_OUTPATIENT_CLINIC_OR_DEPARTMENT_OTHER): Payer: Self-pay

## 2023-06-25 ENCOUNTER — Other Ambulatory Visit (HOSPITAL_BASED_OUTPATIENT_CLINIC_OR_DEPARTMENT_OTHER): Payer: Self-pay

## 2023-06-25 MED ORDER — FLUAD 0.5 ML IM SUSY
0.5000 mL | PREFILLED_SYRINGE | Freq: Once | INTRAMUSCULAR | 0 refills | Status: AC
  Filled 2023-06-25: qty 0.5, 1d supply, fill #0

## 2023-07-06 ENCOUNTER — Other Ambulatory Visit (HOSPITAL_BASED_OUTPATIENT_CLINIC_OR_DEPARTMENT_OTHER): Payer: Self-pay

## 2023-07-06 MED ORDER — COMIRNATY 30 MCG/0.3ML IM SUSY
0.3000 mL | PREFILLED_SYRINGE | Freq: Once | INTRAMUSCULAR | 0 refills | Status: AC
  Filled 2023-07-06: qty 0.3, 1d supply, fill #0

## 2023-07-07 ENCOUNTER — Other Ambulatory Visit: Payer: Self-pay | Admitting: Internal Medicine

## 2023-07-15 NOTE — Progress Notes (Unsigned)
Name: Summer [name]  MRN/ DOB: [id number], 1951-06-10    Age/ Sex: 72 y.o., female    PCP: Allwardt, Crist Infante, PA-C   Reason for Endocrinology Evaluation: Osteoporosis      Date of Initial Endocrinology Evaluation: 09/10/2022    HPI: Summer [name] is a 72 y.o. female with a past medical history of Dyslipidemia, seizure d/o and osteopenia , S/P gastric bypass 2008, recovering alcoholic . The patient presented for initial endocrinology clinic visit on 09/10/2022 for consultative assistance with her Osteoporosis  Pt was diagnosed with Osteoporosis : unknown   Menarche at age : 24 Menopausal at age : 89's. Fracture Hx: none as an adult  Hx of HRT: no FH of osteoporosis or hip fracture: mother and sister  Prior Hx of anti-resorptive therapy : Has been on Prolia : not sure but it has appeared on her med list in 2018.  She did not receive her first injection of Prolia through our system until February 2019  On her initial visit with me I recommended completing Prolia course as she has been on it for approximately 5 years, I did recommend Reclast infusion, but the patient message back stating she wanted to postpone.  She was advised regarding the rebound phenomena of not taking a bridging medication after discontinuation of Prolia.  She was also advised to switch calcium carbonate to citrate due to history of gastric bypass  Patient received her first zoledronic acid injection on 11/03/2022    SUBJECTIVE:    Today (07/16/23):  Ms. Powderly is here for a follow up on osteoporosis.   Denies recent falls  Denies constipation or diarrhea  Denies nausea or vomiting  Denies palpitations  No side effects to zoledronic acid  She does weight bearing exercise, she goes to [employer]   She continues to follow-up with [facility] for complex partial seizures    Vitamin D3 2000 internation units twice daily  Calcium citrate 1200 mg daily     HISTORY:   Past Medical History:  Past Medical History:  Diagnosis Date   Adenomatous polyp of sigmoid colon 03/18/2017   Overview:  Added automatically from request for surgery [id number]   Alcoholism (HCC)    01/18/18 - no use in 5 years   Asymptomatic varicose veins 01/11/2017   Cancer (HCC)    skin ca   Iron deficiency anemia 01/12/2017   Last Assessment & Plan:  Given script for ferrous sulfate and order for repeat cbc with diff and iron studies to be done in 1 month Will also refer pt back to [facility] for repeat colonoscopy and to dietician for nutritional counseling, per pt request   Lentigines 01/11/2017   Melanocytic nevi of left lower limb, including hip 01/11/2017   Melanocytic nevi of left upper limb, including shoulder 01/11/2017   Melanocytic nevi of right lower limb, including hip 01/11/2017   Melanocytic nevi of right upper limb, including shoulder 01/11/2017   Melanocytic nevi of trunk 01/11/2017   Obesity (BMI 30-39.9) 05/21/2017   Osteoporosis    Plantar fasciitis    Primary osteoarthritis, right shoulder 12/11/2016   Seizure (HCC)    LAST SEIZURE- 2016 per pt   Sleep apnea    no cpap per pt   Statins contraindicated 10/2022   Unilateral primary osteoarthritis, left knee 12/11/2016   Past Surgical History:  Past Surgical History:  Procedure Laterality Date   BARIATRIC SURGERY     COLONOSCOPY  03/18/2017   in [place].  ECTOPIC PREGNANCY SURGERY     POLYPECTOMY     SIGMOIDOSCOPY     in [place].   TOTAL KNEE ARTHROPLASTY Left 09/07/2017   Procedure: LEFT TOTAL KNEE ARTHROPLASTY;  Surgeon: Durene Romans, MD;  Location: [facility];  Service: Orthopedics;  Laterality: Left;  Adductor Block    Social History:  reports that she has never smoked. She has never used smokeless tobacco. She reports that she does not drink alcohol and does not use drugs. Family History: family history includes Alcoholism in her father; Colon cancer (age of onset: 16) in her father; Memory loss in her mother;  Mitral valve prolapse in her mother; Osteoporosis in her mother.   HOME MEDICATIONS: Allergies as of 07/16/2023       Reactions   Bee Venom Swelling        Medication List        Accurate as of July 16, 2023  1:20 PM. If you have any questions, ask your nurse or doctor.          aspirin EC 81 MG tablet Take 81 mg by mouth daily. Swallow whole.   Calcium-Vitamin D-Vitamin K [phone] MG-UNT-MCG Tabs Take 1 tablet by mouth 2 (two) times daily. Also includes Vitamin C   Cholecalciferol 100 MCG (4000 UT) Caps Take 4,000 Units by mouth daily.   denosumab 60 MG/ML Soln injection Commonly known as: PROLIA Inject 60 mg into the skin every 6 (six) months. Administer in upper arm, thigh, or abdomen   EPINEPHrine 0.3 mg/0.3 mL Soaj injection Commonly known as: EPI-PEN Inject 0.3 mLs (0.3 mg total) into the muscle as needed for anaphylaxis.   estradiol 0.1 MG/GM vaginal cream Commonly known as: ESTRACE Apply 1/2 gram to vagina/vulva nightly x two weeks, then reduce to 1/2 gm to vagina/vulva 2-3 times a week.   FeroSul 325 (65 Fe) MG tablet Generic drug: ferrous sulfate TAKE ONE TABLET BY MOUTH DAILY WITH VITAMIN C.   lamoTRIgine 200 MG tablet Commonly known as: LaMICtal Take 1 tablet (200 mg total) by mouth 2 (two) times daily.   levETIRAcetam 500 MG tablet Commonly known as: KEPPRA Take 1 tablet twice daily   nystatin cream Commonly known as: MYCOSTATIN Apply 1 Application topically 2 (two) times daily.   omega-3 acid ethyl esters 1 g capsule Commonly known as: LOVAZA Take 1 g by mouth daily.   QC TUMERIC COMPLEX PO Take 1 tablet by mouth daily.   rosuvastatin 20 MG tablet Commonly known as: CRESTOR TAKE 1 TABLET BY MOUTH DAILY   triamcinolone 0.025 % ointment Commonly known as: KENALOG Apply 1 Application topically 2 (two) times daily. Use twice daily for 1 - 2 weeks as needed.   UNABLE TO FIND Take 1 tablet by mouth 2 (two) times daily. Calcium  magnesium intensive care   VITAMIN B 12 PO Take 1 tablet by mouth daily.   vitamin C 100 MG tablet Take 100 mg by mouth daily.   vitamin E 600 UNIT capsule Take 600 Units by mouth daily.          REVIEW OF SYSTEMS: A comprehensive ROS was conducted with the patient and is negative except as per HPI    OBJECTIVE:  VS: BP 124/80   Pulse 74   Ht 5\' 5"  (1.651 m)   Wt 195 lb (88.5 kg)   SpO2 98%   BMI 32.45 kg/m    Wt Readings from Last 3 Encounters:  07/16/23 195 lb (88.5 kg)  05/25/23 193 lb 8  oz (87.8 kg)  04/07/23 189 lb (85.7 kg)     EXAM: General: Pt appears well and is in NAD  Neck: General: Supple without adenopathy. Thyroid: Thyroid size normal.  No goiter or nodules appreciated.   Lungs: Clear with good BS bilat   Heart: Auscultation: RRR.  Abdomen: Normoactive bowel sounds, soft, nontender  Extremities:  BL LE: No pretibial edema normal ROM and strength.  Mental Status: Judgment, insight: Intact Orientation: Oriented to time, place, and person Mood and affect: No depression, anxiety, or agitation     DATA REVIEWED:      Latest Reference Range & Units 05/20/23 11:08  Sodium 135 - 145 mmol/L 139  Potassium 3.5 - 5.1 mmol/L 4.1  Chloride 98 - 111 mmol/L 101  CO2 22 - 32 mmol/L 31  Glucose 70 - 99 mg/dL 77  BUN 8 - 23 mg/dL 12  Creatinine 6.29 - 5.28 mg/dL 4.13  Calcium 8.9 - 24.4 mg/dL 9.8  Anion gap 5 - 15  7  GFR, Estimated >60 mL/min >60     DXA 12/10/2021   ASSESSMENT: The BMD measured at Femur Neck Left is 0.705 g/cm2 with a T-score of -2.4. This patient is considered osteopenic/low bone mass according to World Health Organization [facility]) criteria.   The quality of the exam is good. L3, L4 was excluded due to degenerative changes. The patient does not meet FRAX criteria.   Site Region Measured Date Measured Age YA BMD Significant CHANGE T-score DualFemur Neck Left  12/10/2021    70.2         -2.4    0.705 g/cm2 DualFemur Neck  Left  02/22/2020    68.4         -2.1    0.743 g/cm2   AP Spine  L1-L2      12/10/2021    70.2         -1.4    0.992 g/cm2 AP Spine  L1-L2      02/22/2020    68.4         -1.0    1.043 g/cm2   DualFemur Total Mean 12/10/2021 70.2 -1.5 0.814 g/cm2 * DualFemur Total Mean 02/22/2020    68.4         -1.0    0.882 g/cm2    Old records , labs and images have been reviewed.    ASSESSMENT/PLAN/RECOMMENDATIONS:   Hx of Osteoporosis :  -Patient was on Prolia from 2018 until 2023 -She received 1 dose of zoledronic acid 10/2022 without any side effects -Patient is s/p gastric bypass, I have discussed the importance of optimizing calcium and vitamin D intake  -She had switch calcium from calcium carbonate to citrate for better absorption profile -Will check CTX as a baseline for bone resorption -She will be due for repeat bone density 11/2023   Medications : Calcium citrate 1200 mg daily Continue vitamin D 2000 IU twice daily   Follow-up in 1 year   Signed electronically by: Lyndle Herrlich, MD  [facility]  [facility] [address] [place], [place] [address] Phone: [phone] FAX: [phone]   CC: Allwardt, Crist Infante, PA-C [address] [place] [address] Phone: [phone] Fax: [phone]   Return to Endocrinology clinic as below: Future Appointments  Date Time Provider Department Center  07/27/2023  2:00 PM Marykay Lex, MD [facility] None  10/13/2023  7:45 AM Glean Salvo, NP [facility] None  07/10/2024 11:30 AM Jakyrah Holladay, Konrad Dolores, MD  [facility] None

## 2023-07-16 ENCOUNTER — Ambulatory Visit: Payer: Medicare Other | Admitting: Internal Medicine

## 2023-07-16 ENCOUNTER — Encounter: Payer: Self-pay | Admitting: Internal Medicine

## 2023-07-16 VITALS — BP 124/80 | HR 74 | Ht 65.0 in | Wt 195.0 lb

## 2023-07-16 DIAGNOSIS — M859 Disorder of bone density and structure, unspecified: Secondary | ICD-10-CM | POA: Insufficient documentation

## 2023-07-16 LAB — VITAMIN D 25 HYDROXY (VIT D DEFICIENCY, FRACTURES): VITD: 44.1 ng/mL (ref 30.00–100.00)

## 2023-07-16 LAB — ALBUMIN: Albumin: 4.4 g/dL (ref 3.5–5.2)

## 2023-07-16 NOTE — Patient Instructions (Addendum)
Calcium Citrate 1200 mg daily  Continue Vitamin D 2000 iu Twice daily

## 2023-07-19 LAB — PTH, INTACT AND CALCIUM
Calcium: 9.3 mg/dL (ref 8.6–10.4)
PTH: 40 pg/mL (ref 16–77)

## 2023-07-19 LAB — C-TERMINAL TELOPEPTIDE: C-Telopeptide (CTx): 223 pg/mL

## 2023-07-20 ENCOUNTER — Other Ambulatory Visit (HOSPITAL_BASED_OUTPATIENT_CLINIC_OR_DEPARTMENT_OTHER): Payer: Self-pay

## 2023-07-27 ENCOUNTER — Encounter: Payer: Self-pay | Admitting: Cardiology

## 2023-07-27 ENCOUNTER — Ambulatory Visit: Payer: Medicare Other | Attending: Cardiology | Admitting: Cardiology

## 2023-07-27 VITALS — BP 116/72 | HR 80 | Ht 66.0 in | Wt 194.6 lb

## 2023-07-27 DIAGNOSIS — R079 Chest pain, unspecified: Secondary | ICD-10-CM

## 2023-07-27 DIAGNOSIS — R931 Abnormal findings on diagnostic imaging of heart and coronary circulation: Secondary | ICD-10-CM

## 2023-07-27 DIAGNOSIS — E785 Hyperlipidemia, unspecified: Secondary | ICD-10-CM

## 2023-07-27 NOTE — Progress Notes (Signed)
Cardiology Office Note:  .   Date:  07/27/2023  ID:  Summer [name], DOB Sep 07, 1951, MRN [id number] PCP: Bary Leriche, PA-C  [facility] Providers Cardiologist:  Bryan Lemma, MD     Chief Complaint  Patient presents with   Hospitalization Follow-up    Follow-up for chest pain.  Right-sided arm/shoulder/chest pain.  Nonexertional   Follow-up    Was last seen in November 2023 by Dr. Rennis Golden   Coronary Artery Disease    Elevated coronary calcium score with hyperlipidemia.  No chest pain or pressure since that 1 episode.  Active and exercises.    Patient Profile: Summer [name]     Summer [name] is a very pleasant 72 y.o. female with a PMH notable for Partial Symptomatic Epilepsy/Partial Seizure, and elevated Coronary Calcium Score of 896 HLD plus anemia who presents here for hospital follow-up/annual follow-up at the request of Allwardt, Alyssa M, PA-C.  She has been followed by Dr. Rennis Golden for hyperlipidemia had been started on rosuvastatin 20 mg daily with notable improvement in lipids.=> Noted to have negative LP(a).  [name] was seen at the emergency room by Dr. Rhunette Croft on 05/20/2023 for precordial chest pain symptoms began 11 AM, right-sided under the right breast.  Did not radiate.  No cardiac history.  No other coronary calcification on CT non-smoker.  Extremely active exercising several times a day.     Subjective   INTERVAL HPI Discussed the use of AI scribe software for clinical note transcription with the patient, who gave verbal consent to proceed.  History of Present Illness   "Summer [name]" is a pleasant 68 y/0 with a history of high coronary calcium score and on rosuvastatin for cholesterol management, presented for a follow-up after a recent visit to the emergency room due to right-sided chest pain. The pain, described as intense and located in the chest radiating down the arm, occurred while driving and not during exercise. The episode lasted approximately  20 minutes and did not abate with rest. The patient denied any associated symptoms such as shortness of breath or heart palpitations. Since the ER visit, the patient has not experienced any more chest pain.  The patient is active, participating in water aerobics and treadmill exercises. The patient also has a history of knee replacement and vertigo. The patient's cholesterol levels have improved significantly on rosuvastatin, with the most recent LDL level down to 77. The patient's troponin levels during the ER visit were normal, indicating no heart damage.       Cardiovascular ROS: positive for - chest pain and this is described as a right-sided chest and shoulder down the right arm pain.  Was worse after doing upper arm exercises combined with swelling.  No longer present. negative for - dyspnea on exertion, edema, irregular heartbeat, orthopnea, palpitations, paroxysmal nocturnal dyspnea, rapid heart rate, shortness of breath, or lightheadedness (wooziness, syncope or near syncope, TIA or amaurosis fugax, claudication.  Myalgias or arthralgias.   ROS:  Review of Systems - Negative except symptoms noted above.  Denies any melena, hematochezia hematuria epistaxis.  No recent illnesses, fevers chills colds or sweats.  No coughing or sneezing.  Notable Family History-mother and sister had mitral valve disease. Social History: Never smoked or use illicit drugs.  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  Many of her family members were alumni.  Previously from [facility], [place]     Objective   Studies Reviewed: Summer [name]   EKG Interpretation Date/Time:  Tuesday July 27 2023  14:12:00 EDT Ventricular Rate:  76 PR Interval:  120 QRS Duration:  88 QT Interval:  400 QTC Calculation: 450 R Axis:   71  Text Interpretation: Normal sinus rhythm Nonspecific ST abnormality Abnormal QRS-T angle, consider primary T wave abnormality When compared with ECG of 20-May-2023 10:59, Inverted T waves have  replaced nonspecific T wave abnormality in Inferior leads Confirmed by Bryan Lemma ([id number]) on 07/27/2023 2:33:28 PM    Coronary Calcium Score 01/28/2022: Total score 896 (LAD 426, LCx 299, RCA 171).  96th percentile. Myoview January 2019: No ischemia or infarction.  Troponin: 3, 4 (06/2023)  Lab Results  Component Value Date   CHOL 188 06/16/2021   HDL 59.90 06/16/2021   LDLCALC 115 (H) 06/16/2021   TRIG 68.0 06/16/2021   CHOLHDL 3 06/16/2021   Lab Results  Component Value Date   NA 139 05/20/2023   CL 101 05/20/2023   K 4.1 05/20/2023   CO2 31 05/20/2023   BUN 12 05/20/2023   CREATININE 0.58 05/20/2023   GFRNONAA >60 05/20/2023   CALCIUM 9.3 07/16/2023   ALBUMIN 4.4 07/16/2023   GLUCOSE 77 05/20/2023     Risk Assessment/Calculations:               Physical Exam:   VS:  BP 116/72   Pulse 80   Ht 5\' 6"  (1.676 m)   Wt 194 lb 9.6 oz (88.3 kg)   SpO2 98%   BMI 31.41 kg/m    Wt Readings from Last 3 Encounters:  07/27/23 194 lb 9.6 oz (88.3 kg)  07/16/23 195 lb (88.5 kg)  05/25/23 193 lb 8 oz (87.8 kg)    GEN: Well nourished, well developed in no acute distress; healthy-appearing.  Well-groomed. NECK: No JVD; No carotid bruits CARDIAC: Normal S1, S2; RRR, no murmurs, rubs, gallops RESPIRATORY:  Clear to auscultation without rales, wheezing or rhonchi ; nonlabored, good air movement. ABDOMEN: Soft, non-tender, non-distended EXTREMITIES:  No edema; No deformity      ASSESSMENT AND PLAN: .    Problem List Items Addressed This Visit       Cardiology Problems   Agatston coronary artery calcium score greater than 400 (Chronic)    Likely nonobstructive coronary disease as she has not had any active anginal symptoms.  Other risk factors are relatively well-controlled and she is on a statin with notable improvement of her lipids.  Does not have diabetes, is a non-smoker and is very active exercising routinely.  In the absence of active symptoms, I do not see any  reason for proceeding with any Ischemic evaluation despite the elevated score as would not likely change her management plan.  She is taking an aspirin and statin.  If BP becomes an issue would consider beta-blocker, ARB or calcium channel blocker.  Discussed concerning symptoms which would warrant return evaluation for possible ischemic CAD.      Hyperlipidemia with target low density lipoprotein (LDL) cholesterol less than 70 mg/dL (Chronic)    Significant improvement in LDL cholesterol levels, from 115 to 77, on rosuvastatin. -Continue rosuvastatin. -Check fasting lipid panel and liver function tests in December 2024.      Relevant Orders   EKG 12-Lead (Completed)   Hepatic function panel   Lipid panel     Other   Right-sided chest pain - Primary    Pain in the right chest and arm lasting approximately 20 minutes while driving, not during exercise. No associated shortness of breath or heart palpitations. Pain has not recurred.  Troponin levels were normal. Likely musculoskeletal in nature, possibly related to recent increase in exercise including swimming and weight-bearing machines. -Gradually increase exercise intensity, being mindful of any recurrence of chest pain or new symptoms. -Return for evaluation if symptoms of exertional discomfort or shortness of breath occur.                     Dispo: Return in about 1 year (around 07/26/2024) for 1 Yr Follow-up. -Annual follow-up in October 2025, or sooner if symptoms occur.   Total time spent: 30 min spent with patient + 17 min spent charting = 47 min      Signed, Marykay Lex, MD, MS Bryan Lemma, M.D., M.S. Interventional Cardiologist  [facility]  Pager # [phone] Phone # [phone] [address] [place], [place] [address]

## 2023-07-27 NOTE — Assessment & Plan Note (Signed)
Significant improvement in LDL cholesterol levels, from 115 to 77, on rosuvastatin. -Continue rosuvastatin. -Check fasting lipid panel and liver function tests in December 2024.

## 2023-07-27 NOTE — Assessment & Plan Note (Signed)
Likely nonobstructive coronary disease as she has not had any active anginal symptoms.  Other risk factors are relatively well-controlled and she is on a statin with notable improvement of her lipids.  Does not have diabetes, is a non-smoker and is very active exercising routinely.  In the absence of active symptoms, I do not see any reason for proceeding with any Ischemic evaluation despite the elevated score as would not likely change her management plan.  She is taking an aspirin and statin.  If BP becomes an issue would consider beta-blocker, ARB or calcium channel blocker.  Discussed concerning symptoms which would warrant return evaluation for possible ischemic CAD.

## 2023-07-27 NOTE — Assessment & Plan Note (Signed)
Pain in the right chest and arm lasting approximately 20 minutes while driving, not during exercise. No associated shortness of breath or heart palpitations. Pain has not recurred. Troponin levels were normal. Likely musculoskeletal in nature, possibly related to recent increase in exercise including swimming and weight-bearing machines. -Gradually increase exercise intensity, being mindful of any recurrence of chest pain or new symptoms. -Return for evaluation if symptoms of exertional discomfort or shortness of breath occur.

## 2023-07-27 NOTE — Patient Instructions (Signed)
Medication Instructions:   Not needed *If you need a refill on your cardiac medications before your next appointment, please call your pharmacy*   Lab [facility] - Dec 2024 Lipid Hepatic  If you have labs (blood work) drawn today and your tests are completely normal, you will receive your results only by: MyChart Message (if you have MyChart) OR A paper copy in the mail If you have any lab test that is abnormal or we need to change your treatment, we will call you to review the results.   Testing/Procedures:  Not needed  Follow-Up: At [facility], you and your health needs are our priority.  As part of our continuing mission to provide you with exceptional heart care, we have created designated Provider Care Teams.  These Care Teams include your primary Cardiologist (physician) and Advanced Practice Providers (APPs -  Physician Assistants and Nurse Practitioners) who all work together to provide you with the care you need, when you need it.     Your next appointment:   12 month(s)  The format for your next appointment:   In Person  Provider:   Bryan Lemma, MD    Other Instructions    Recommended building exercise and increase heart rate

## 2023-08-02 ENCOUNTER — Encounter: Payer: Self-pay | Admitting: Neurology

## 2023-08-02 ENCOUNTER — Ambulatory Visit: Payer: Medicare Other | Admitting: Family

## 2023-08-02 VITALS — BP 120/64 | HR 75 | Temp 98.2°F | Ht 66.0 in | Wt 196.0 lb

## 2023-08-02 DIAGNOSIS — T148XXA Other injury of unspecified body region, initial encounter: Secondary | ICD-10-CM

## 2023-08-02 NOTE — Progress Notes (Signed)
Patient ID: Summer [name], female    DOB: 06-03-1951, 72 y.o.   MRN: [id number]  Chief Complaint  Patient presents with   Wound Check    Wound on the left arm, since Wednesday, has been taking care of it but wound stop bleeding ( seems to have stop in the office when dressing was taken off). Pt stated on Thursday when she went to redress the wound, another wound was cause by her taking off the band aid    *Discussed the use of AI scribe software for clinical note transcription with the patient, who gave verbal consent to proceed.  History of Present Illness   The patient presents with a scrape on her left arm that occurred last week. She reports two separate incidents, one on Wednesday and then the 2nd one after using a strong adhesive bandage to keep the gauze in place, causing some skin to come off when she removed the bandage  on Thursday. She has been cleaning the wound with soap and water, applying antibiotic cream, and covering it with gauze and a bandage. She reports that the wound was bleeding the previous day, but it has since stopped.  She states the original scrape has a scab & healing.      Assessment & Plan:     Skin Abrasion - Recent skin abrasion with no signs of infection. Wound is shallow with dry granulating tissue, approx 2.5cm x 1cm in size. Patient has been cleaning the wound and applying antibiotic cream. -Clean with soap and water, dry well, and apply Tegaderm dressing daily for the next two days, pt instructed on how to apply. -Advise patient to monitor for signs of infection such as increased redness, pain, or oozing and call the office if occurs.  Subjective:    Outpatient Medications Prior to Visit  Medication Sig Dispense Refill   Ascorbic Acid (VITAMIN C) 100 MG tablet Take 100 mg by mouth daily.     aspirin EC 81 MG tablet Take 81 mg by mouth daily. Swallow whole.     Calcium-Vitamin D-Vitamin K [id number] MG-UNT-MCG TABS Take 1 tablet by mouth 2 (two)  times daily. Also includes Vitamin C     Cholecalciferol 100 MCG (4000 UT) CAPS Take 4,000 Units by mouth daily.     Cyanocobalamin (VITAMIN B 12 PO) Take 1 tablet by mouth daily.     denosumab (PROLIA) 60 MG/ML SOLN injection Inject 60 mg into the skin every 6 (six) months. Administer in upper arm, thigh, or abdomen     EPINEPHrine 0.3 mg/0.3 mL IJ SOAJ injection Inject 0.3 mLs (0.3 mg total) into the muscle as needed for anaphylaxis. 2 each 1   estradiol (ESTRACE) 0.1 MG/GM vaginal cream Apply 1/2 gram to vagina/vulva nightly x two weeks, then reduce to 1/2 gm to vagina/vulva 2-3 times a week. 42.5 g 1   FEROSUL 325 (65 Fe) MG tablet TAKE ONE TABLET BY MOUTH DAILY WITH VITAMIN C. 30 tablet 2   lamoTRIgine (LAMICTAL) 200 MG tablet Take 1 tablet (200 mg total) by mouth 2 (two) times daily. 180 tablet 3   nystatin cream (MYCOSTATIN) Apply 1 Application topically 2 (two) times daily. 30 g 0   omega-3 acid ethyl esters (LOVAZA) 1 g capsule Take 1 g by mouth daily.     rosuvastatin (CRESTOR) 20 MG tablet TAKE 1 TABLET BY MOUTH DAILY 90 tablet 3   triamcinolone (KENALOG) 0.025 % ointment Apply 1 Application topically 2 (two) times daily. Use twice  daily for 1 - 2 weeks as needed. 30 g 0   Turmeric (QC TUMERIC COMPLEX PO) Take 1 tablet by mouth daily.     UNABLE TO FIND Take 1 tablet by mouth 2 (two) times daily. Calcium magnesium intensive care     vitamin E 600 UNIT capsule Take 600 Units by mouth daily.     levETIRAcetam (KEPPRA) 500 MG tablet Take 1 tablet twice daily (Patient not taking: Reported on 08/02/2023) 180 tablet 4   No facility-administered medications prior to visit.   Past Medical History:  Diagnosis Date   Adenomatous polyp of sigmoid colon 03/18/2017   Overview:  Added automatically from request for surgery [id number]   Agatston coronary artery calcium score greater than 400 01/28/2022   Coronary Calcium Score 896 (LAD 426, LCx 299, RCA 171).  96th percentile.   Alcoholism (HCC)     01/18/18 - no use in 5 years   Asymptomatic varicose veins 01/11/2017   Cancer (HCC)    skin ca   Iron deficiency anemia 01/12/2017   Last Assessment & Plan:  Given script for ferrous sulfate and order for repeat cbc with diff and iron studies to be done in 1 month Will also refer pt back to [facility] for repeat colonoscopy and to dietician for nutritional counseling, per pt request   Lentigines 01/11/2017   Melanocytic nevi of left lower limb, including hip 01/11/2017   Melanocytic nevi of left upper limb, including shoulder 01/11/2017   Melanocytic nevi of right lower limb, including hip 01/11/2017   Melanocytic nevi of right upper limb, including shoulder 01/11/2017   Melanocytic nevi of trunk 01/11/2017   Obesity (BMI 30-39.9) 05/21/2017   Osteoporosis    Plantar fasciitis    Primary osteoarthritis, right shoulder 12/11/2016   Seizure (HCC)    LAST SEIZURE- 2016 per pt   Sleep apnea    no cpap per pt   Statins contraindicated 10/2022   Tolerating rosuvastatin.   Unilateral primary osteoarthritis, left knee 12/11/2016   Past Surgical History:  Procedure Laterality Date   BARIATRIC SURGERY     COLONOSCOPY  03/18/2017   in [place].   ECTOPIC PREGNANCY SURGERY     NM MYOVIEW LTD  10/2017   Normal EF.  LOW RISK.  No ischemia or infarction.  No RWMA.   POLYPECTOMY     SIGMOIDOSCOPY     in [place].   TOTAL KNEE ARTHROPLASTY Left 09/07/2017   Procedure: LEFT TOTAL KNEE ARTHROPLASTY;  Surgeon: Durene Romans, MD;  Location: [facility];  Service: Orthopedics;  Laterality: Left;  Adductor Block   Allergies  Allergen Reactions   Bee Venom Swelling      Objective:    Physical Exam Vitals and nursing note reviewed.  Constitutional:      Appearance: Normal appearance.  Cardiovascular:     Rate and Rhythm: Normal rate and regular rhythm.  Pulmonary:     Effort: Pulmonary effort is normal.     Breath sounds: Normal breath sounds.  Musculoskeletal:        General: Normal range of motion.   Skin:    General: Skin is warm and dry.     Findings: Lesion (left anterior arm, shallow with dry granulating tissue, approx 2.5cm x 1cm in size) present.  Neurological:     Mental Status: She is alert.  Psychiatric:        Mood and Affect: Mood normal.        Behavior: Behavior normal.    BP 120/64  Pulse 75   Temp 98.2 F (36.8 C) (Temporal)   Ht 5\' 6"  (1.676 m)   Wt 196 lb (88.9 kg)   SpO2 96%   BMI 31.64 kg/m  Wt Readings from Last 3 Encounters:  08/02/23 196 lb (88.9 kg)  07/27/23 194 lb 9.6 oz (88.3 kg)  07/16/23 195 lb (88.5 kg)      Dulce Sellar, NP

## 2023-09-10 ENCOUNTER — Telehealth: Payer: Self-pay | Admitting: Physician Assistant

## 2023-09-10 NOTE — Telephone Encounter (Signed)
Patient called stating she was returning your call. States it was in regards to billing.

## 2023-09-10 NOTE — Telephone Encounter (Signed)
Left patient vm to call back in regard.  °

## 2023-09-10 NOTE — Telephone Encounter (Signed)
Rayza was calling on behalf of spouse. A phone note in spouse chart has been started.

## 2023-09-28 ENCOUNTER — Other Ambulatory Visit: Payer: Self-pay | Admitting: Family Medicine

## 2023-09-28 ENCOUNTER — Telehealth: Payer: Self-pay | Admitting: Physician Assistant

## 2023-09-28 DIAGNOSIS — Z1231 Encounter for screening mammogram for malignant neoplasm of breast: Secondary | ICD-10-CM

## 2023-09-28 NOTE — Telephone Encounter (Signed)
FYI order was placed by Dr Jimmey Ralph during visit

## 2023-09-28 NOTE — Telephone Encounter (Signed)
Patient called stating the breast center informed her she will need a diagnostic breast exam. States this is due to them seeing that she is experiencing itching around the nipple that hasn't resolved. Please correct and re-send for scheduling.

## 2023-09-28 NOTE — Telephone Encounter (Signed)
Please see call message and advise if ok to place order

## 2023-09-28 NOTE — Telephone Encounter (Signed)
Apologies. Patient saw Dr. Jimmey Ralph this morning, he is the one who placed order. Please advise.

## 2023-09-29 NOTE — Telephone Encounter (Signed)
Patient was not seen by Dr Jimmey Ralph awaiting a response in regards to order if anything further is needed for patient

## 2023-09-30 ENCOUNTER — Encounter: Payer: Self-pay | Admitting: Physician Assistant

## 2023-10-01 NOTE — Telephone Encounter (Signed)
Please see pt msg and advise 

## 2023-10-05 NOTE — Telephone Encounter (Signed)
Noted and agreed, thank you. 

## 2023-10-05 NOTE — Telephone Encounter (Signed)
Pt scheduled  

## 2023-10-06 ENCOUNTER — Encounter: Payer: Self-pay | Admitting: Physician Assistant

## 2023-10-06 ENCOUNTER — Telehealth: Payer: Self-pay | Admitting: Neurology

## 2023-10-06 NOTE — Telephone Encounter (Signed)
Please see pt msg and advise; showing last diagnostic mammogram was in Jan 2024

## 2023-10-06 NOTE — Telephone Encounter (Signed)
Pt called to r/s appt due to time conflict

## 2023-10-13 ENCOUNTER — Ambulatory Visit: Payer: Medicare Other | Admitting: Neurology

## 2023-10-22 ENCOUNTER — Other Ambulatory Visit (HOSPITAL_BASED_OUTPATIENT_CLINIC_OR_DEPARTMENT_OTHER): Payer: Self-pay

## 2023-10-25 ENCOUNTER — Ambulatory Visit: Payer: Medicare Other | Admitting: Physician Assistant

## 2023-10-26 ENCOUNTER — Encounter: Payer: Self-pay | Admitting: Physician Assistant

## 2023-10-26 ENCOUNTER — Ambulatory Visit: Payer: Medicare Other | Admitting: Physician Assistant

## 2023-10-26 ENCOUNTER — Other Ambulatory Visit: Payer: Self-pay | Admitting: Physician Assistant

## 2023-10-26 VITALS — BP 104/58 | HR 66 | Temp 97.2°F | Ht 66.0 in | Wt 200.6 lb

## 2023-10-26 DIAGNOSIS — M859 Disorder of bone density and structure, unspecified: Secondary | ICD-10-CM

## 2023-10-26 DIAGNOSIS — Z780 Asymptomatic menopausal state: Secondary | ICD-10-CM | POA: Diagnosis not present

## 2023-10-26 DIAGNOSIS — R931 Abnormal findings on diagnostic imaging of heart and coronary circulation: Secondary | ICD-10-CM

## 2023-10-26 DIAGNOSIS — N644 Mastodynia: Secondary | ICD-10-CM

## 2023-10-26 DIAGNOSIS — M542 Cervicalgia: Secondary | ICD-10-CM

## 2023-10-26 DIAGNOSIS — E669 Obesity, unspecified: Secondary | ICD-10-CM

## 2023-10-26 MED ORDER — WEGOVY 0.25 MG/0.5ML ~~LOC~~ SOAJ
0.2500 mg | SUBCUTANEOUS | 0 refills | Status: DC
Start: 2023-10-26 — End: 2024-01-25
  Filled 2023-10-29: qty 2, 28d supply, fill #0

## 2023-10-26 NOTE — Progress Notes (Signed)
 Patient ID: Summer [name], female    DOB: 1951/01/28, 72 y.o.   MRN: [id number]   Assessment & Plan:  Low bone density -     DG Bone Density; Future  Post-menopausal -     DG Bone Density; Future  Obesity (BMI 30-39.9) -     Wegovy ; Inject 0.25 mg into the skin once a week.  Dispense: 2 mL; Refill: 0  High coronary artery calcium  score -     Wegovy ; Inject 0.25 mg into the skin once a week.  Dispense: 2 mL; Refill: 0  Breast pain, right -     MM 3D DIAGNOSTIC MAMMOGRAM BILATERAL BREAST; Future  Neck pain -     Ambulatory referral to Sports Medicine   Assessment and Plan    Obesity Weight increased from 190 to 200 lbs over the past few months. Discussed the benefits and potential side effects of GLP-1 agonists, specifically Wegovy , for weight loss. No history of medullary thyroid  cancer. -Start Wegovy , titrate slowly, and monitor for side effects such as nausea, vomiting, upset stomach, and constipation.  Hyperlipidemia On Crestor  for cholesterol management. -Continue Crestor .  Right Breast Pain and Itching Reports months of intermittent right breast pain and itching, with occasional palpable bumps. No family history of breast issues. -Order diagnostic bilateral mammogram to evaluate symptoms.  Neck Pain and Right Arm Pain Reports intermittent neck pain and right arm pain. History of neck issues and right-sided sports activities. -Refer to sports medicine for further evaluation and potential imaging.  Osteopenia Last DEXA scan in February 2023 showed osteopenia. -Order DEXA scan in February 2025 for follow-up.  General Health Maintenance -Continue regular exercise regimen. -Continue current diet and consider resuming Noom or similar program.           Return in about 3 months (around 01/24/2024) for recheck/follow-up.    Subjective:    Chief Complaint  Patient presents with   Weight Loss    Pt in office and concerned with weight and would like to  discuss Wegovy  and any other options; pt also c/o right side neck pain, been present for the year but pain is getting sharper; no imaging completed; Was called for mammogram but when asked if she has had any changes was told she need diagnostic and needs order placed for the breast center of [place].    HPI Discussed the use of AI scribe software for clinical note transcription with the patient, who gave verbal consent to proceed.  History of Present Illness   The patient, with a history of high cholesterol and osteopenia, presents with multiple concerns. The primary concern is weight gain, with the patient reporting an increase from 190 to 200 pounds over the past two months despite regular gym activities. The patient had previously used Noom with success and is considering starting Wegovy  for further weight loss.  The patient also reports an itchy right breast with occasional bumps. The itching has been present for several months and is localized around the nipple. The patient denies any family history of breast issues.  Additionally, the patient experiences neck pain associated with headaches and right arm pain. The pain is not constant and does not occur at any specific time of day. The patient has a history of neck issues and regularly performs neck stretches at the gym.  The patient also mentions a history of playing tennis in college and current gym activities. The patient is due for a mammogram and a bone density test.  Past Medical History:  Diagnosis Date   Adenomatous polyp of sigmoid colon 03/18/2017   Overview:  Added automatically from request for surgery [phone]   Agatston coronary artery calcium  score greater than 400 01/28/2022   Coronary Calcium  Score 896 (LAD 426, LCx 299, RCA 171).  96th percentile.   Alcoholism (HCC)    01/18/18 - no use in 5 years   Asymptomatic varicose veins 01/11/2017   Cancer (HCC)    skin ca   Iron  deficiency anemia 01/12/2017   Last  Assessment & Plan:  Given script for ferrous sulfate  and order for repeat cbc with diff and iron  studies to be done in 1 month Will also refer pt back to [facility] for repeat colonoscopy and to dietician for nutritional counseling, per pt request   Lentigines 01/11/2017   Melanocytic nevi of left lower limb, including hip 01/11/2017   Melanocytic nevi of left upper limb, including shoulder 01/11/2017   Melanocytic nevi of right lower limb, including hip 01/11/2017   Melanocytic nevi of right upper limb, including shoulder 01/11/2017   Melanocytic nevi of trunk 01/11/2017   Obesity (BMI 30-39.9) 05/21/2017   Osteoporosis    Plantar fasciitis    Primary osteoarthritis, right shoulder 12/11/2016   Seizure (HCC)    LAST SEIZURE- 2016 per pt   Sleep apnea    no cpap per pt   Statins contraindicated 10/2022   Tolerating rosuvastatin .   Unilateral primary osteoarthritis, left knee 12/11/2016    Past Surgical History:  Procedure Laterality Date   BARIATRIC SURGERY     COLONOSCOPY  03/18/2017   in [place].   ECTOPIC PREGNANCY SURGERY     NM MYOVIEW  LTD  10/2017   Normal EF.  LOW RISK.  No ischemia or infarction.  No RWMA.   POLYPECTOMY     SIGMOIDOSCOPY     in [place].   TOTAL KNEE ARTHROPLASTY Left 09/07/2017   Procedure: LEFT TOTAL KNEE ARTHROPLASTY;  Surgeon: Ernie Cough, MD;  Location: [facility];  Service: Orthopedics;  Laterality: Left;  Adductor Block    Family History  Problem Relation Age of Onset   Mitral valve prolapse Mother    Osteoporosis Mother    Memory loss Mother    Colon cancer Father 55   Alcoholism Father    Colon polyps Neg Hx    Esophageal cancer Neg Hx    Rectal cancer Neg Hx    Stomach cancer Neg Hx     Social History   Tobacco Use   Smoking status: Never   Smokeless tobacco: Never  Vaping Use   Vaping status: Never Used  Substance Use Topics   Alcohol use: No   Drug use: No     Allergies  Allergen Reactions   Bee Venom Swelling    Review of  Systems NEGATIVE UNLESS OTHERWISE INDICATED IN HPI      Objective:     BP (!) 104/58 (BP Location: Left Arm, Patient Position: Sitting, Cuff Size: Large)   Pulse 66   Temp (!) 97.2 F (36.2 C) (Temporal)   Ht 5' 6 (1.676 m)   Wt 200 lb 9.6 oz (91 kg)   SpO2 99%   BMI 32.38 kg/m   Wt Readings from Last 3 Encounters:  10/26/23 200 lb 9.6 oz (91 kg)  08/02/23 196 lb (88.9 kg)  07/27/23 194 lb 9.6 oz (88.3 kg)    BP Readings from Last 3 Encounters:  10/26/23 (!) 104/58  08/02/23 120/64  07/27/23 116/72  Physical Exam Vitals and nursing note reviewed. Exam conducted with a chaperone present.  Constitutional:      Appearance: Normal appearance. She is normal weight. She is not toxic-appearing.  HENT:     Head: Normocephalic and atraumatic.     Right Ear: External ear normal.     Left Ear: External ear normal.     Nose: Nose normal.     Mouth/Throat:     Mouth: Mucous membranes are moist.  Eyes:     Extraocular Movements: Extraocular movements intact.     Conjunctiva/sclera: Conjunctivae normal.     Pupils: Pupils are equal, round, and reactive to light.  Cardiovascular:     Rate and Rhythm: Normal rate and regular rhythm.     Pulses: Normal pulses.     Heart sounds: Normal heart sounds.  Pulmonary:     Effort: Pulmonary effort is normal.     Breath sounds: Normal breath sounds.  Chest:     Chest wall: No mass.  Breasts:    Right: Normal. No nipple discharge or tenderness.     Left: Normal. No nipple discharge or tenderness.  Abdominal:     General: Abdomen is flat. Bowel sounds are normal.     Palpations: Abdomen is soft.  Musculoskeletal:        General: Tenderness (diffuse R trapezius) present. Normal range of motion.     Cervical back: Normal range of motion and neck supple.  Lymphadenopathy:     Upper Body:     Right upper body: No supraclavicular, axillary or pectoral adenopathy.     Left upper body: No supraclavicular, axillary or pectoral  adenopathy.  Skin:    General: Skin is warm and dry.     Findings: No lesion or rash.  Neurological:     General: No focal deficit present.     Mental Status: She is alert and oriented to person, place, and time.  Psychiatric:        Mood and Affect: Mood normal.        Behavior: Behavior normal.        Thought Content: Thought content normal.        Judgment: Judgment normal.          Raphel Stickles M Cay Kath, PA-C

## 2023-10-26 NOTE — Patient Instructions (Signed)
 VISIT SUMMARY:  During today's visit, we discussed several of your health concerns, including weight gain, right breast itching, neck and right arm pain, and your ongoing management of high cholesterol and osteopenia. We reviewed your current medications and lifestyle habits, and we have made some adjustments to your treatment plan to address these issues.  YOUR PLAN:  -OBESITY: Obesity is a condition characterized by excessive body fat. We discussed starting Wegovy , a medication that can help with weight loss. You should start this medication and increase the dose slowly while monitoring for side effects such as nausea, vomiting, upset stomach, and constipation.  -HYPERLIPIDEMIA: Hyperlipidemia means having high levels of cholesterol in the blood. You should continue taking Crestor  as prescribed to manage your cholesterol levels.  -RIGHT BREAST PAIN AND ITCHING: You have been experiencing intermittent pain and itching in your right breast, with occasional bumps. To investigate these symptoms further, we have ordered a diagnostic bilateral mammogram.  -NECK PAIN AND RIGHT ARM PAIN: You have intermittent neck pain and right arm pain, possibly related to your history of neck issues and sports activities. We are referring you to sports medicine for further evaluation and potential imaging.  -OSTEOPENIA: Osteopenia is a condition where bone mineral density is lower than normal. Your last bone density scan showed osteopenia, and we will repeat the scan in February 2025 to monitor your condition.  -GENERAL HEALTH MAINTENANCE: Continue your regular exercise regimen and current diet. Consider resuming Noom or a similar program to help with weight management.  INSTRUCTIONS:  Please start Wegovy  as discussed, and titrate the dose slowly. Monitor for any side effects and report them to us . Continue taking Crestor  as prescribed. Schedule and complete the diagnostic bilateral mammogram. Follow up with sports  medicine for your neck and right arm pain. Plan to have a DEXA scan in February 2025 to monitor your osteopenia. Maintain your regular exercise routine and consider resuming Noom or a similar program for weight management.

## 2023-10-29 ENCOUNTER — Other Ambulatory Visit (HOSPITAL_BASED_OUTPATIENT_CLINIC_OR_DEPARTMENT_OTHER): Payer: Self-pay

## 2023-11-01 ENCOUNTER — Telehealth: Payer: Self-pay

## 2023-11-01 NOTE — Telephone Encounter (Signed)
 Pharmacy Patient Advocate Encounter   Received notification from CoverMyMeds that prior authorization for Wegovy  0.25MG /0.5ML is required/requested.   Insurance verification completed.   The patient is insured through [facility] .   Per test claim: PA required; PA submitted to above mentioned insurance via CoverMyMeds Key/confirmation #/EOC [id number] Status is pending

## 2023-11-03 ENCOUNTER — Encounter (HOSPITAL_BASED_OUTPATIENT_CLINIC_OR_DEPARTMENT_OTHER): Payer: Self-pay

## 2023-11-03 ENCOUNTER — Other Ambulatory Visit (HOSPITAL_BASED_OUTPATIENT_CLINIC_OR_DEPARTMENT_OTHER): Payer: Self-pay

## 2023-11-03 ENCOUNTER — Other Ambulatory Visit (HOSPITAL_COMMUNITY): Payer: Self-pay

## 2023-11-03 NOTE — Telephone Encounter (Signed)
 Pharmacy Patient Advocate Encounter  Received notification from [employer] that Prior Authorization for Wegovy  0.25MG /0.5ML auto-injectors has been DENIED.  See denial reason below. No denial letter attached in CMM. Will attach denial letter to Media tab once received.   CMM Key# [id number]  Note to Dr: Michel denied due to weight loss medications being excluded from coverage for Part D plans.

## 2023-11-03 NOTE — Telephone Encounter (Signed)
 Noted and patient is already aware of denial

## 2023-11-03 NOTE — Progress Notes (Signed)
 error

## 2023-11-04 ENCOUNTER — Encounter: Payer: Medicare Other | Admitting: Sports Medicine

## 2023-11-05 NOTE — Progress Notes (Signed)
 This encounter was created in error - please disregard.

## 2023-11-10 ENCOUNTER — Encounter: Payer: Self-pay | Admitting: Physician Assistant

## 2023-11-10 NOTE — Telephone Encounter (Signed)
 Please see pt note/concern; last Colonscopy in chart shows Repeat colonoscopy in 3 years for surveillance if all 3 polyps are adenomas. Otherwise, repeat colonoscopy in 5 years. Pt has family hx of colon cancer. Please advise on orders for colonoscopy/endoscopy

## 2023-11-15 NOTE — Telephone Encounter (Signed)
Please see pt msg in response to [facility] being denied by her insurance. Would you like patient to schedule VV/OV to discuss further options?

## 2023-11-15 NOTE — Progress Notes (Unsigned)
Summer [name] D.[name] [address] [place] [address] Phone: [phone]   Assessment and Plan:    1. DDD (degenerative disc disease), cervical 2. Neck pain 3. Chronic right shoulder pain 4. Osteoarthritis of right glenohumeral joint  -Chronic with exacerbation, initial sports medicine visit - Most consistent with flare of degenerative changes in cervical spine and right shoulder osteoarthritis - X-rays obtained in clinic.  My interpretation: No acute fracture, dislocation, vertebral collapse.  Moderate degenerative changes in right glenohumeral joint with spurring and cortical irregularities.  Mild to moderate degenerative changes in [facility] joint.  Degenerative changes in C-spine most significant at C5-C6 - Start meloxicam 15 mg daily x2 weeks.  If still having pain after 2 weeks, complete 3rd-week of NSAID. May use remaining NSAID as needed once daily for pain control.  Do not to use additional over-the-counter NSAIDs (ibuprofen, naproxen, Advil, Aleve) while taking prescription NSAIDs.  May use Tylenol [phone] mg 2 to 3 times a day for breakthrough pain. -Start HEP and physical therapy for neck and shoulder  15 additional minutes spent for educating Therapeutic Home Exercise Program.  This included exercises focusing on stretching, strengthening, with focus on eccentric aspects.   Long term goals include an improvement in range of motion, strength, endurance as well as avoiding reinjury. Patient's frequency would include in 1-2 times a day, 3-5 times a week for a duration of 6-12 weeks. Proper technique shown and discussed handout in great detail with ATC.  All questions were discussed and answered.    Pertinent previous records reviewed include none  Follow Up: 4 weeks for reevaluation.  If no improvement or worsening of symptoms, could consider advanced imaging for neck versus CSI for shoulder   Subjective:   I, Summer [name], am serving  as a Neurosurgeon for Doctor Summer [name]  Chief Complaint: neck and shoulder pain   HPI:   11/16/2023 Patient is a 73 year old female with neck and shoulder pain. Patient states pain for a long time but the pain is increasing. Decreased ROM due to pain . Since last year the neck and right shoulder have gotten worse. No MOI. Pain is radiating down the arm now. Decreased ROM neck . She does got to sage well. Ibu for the pain and that helps. Decrease in grip strength has also noticed right arm weakness as well.     Relevant Historical Information: Osteoporosis, history of epilepsy  Additional pertinent review of systems negative.   Current Outpatient Medications:    Ascorbic Acid (VITAMIN C) 100 MG tablet, Take 100 mg by mouth daily., Disp: , Rfl:    aspirin EC 81 MG tablet, Take 81 mg by mouth daily. Swallow whole., Disp: , Rfl:    Calcium-Vitamin D-Vitamin K [phone] MG-UNT-MCG TABS, Take 1 tablet by mouth 2 (two) times daily. Also includes Vitamin C, Disp: , Rfl:    Cholecalciferol 100 MCG (4000 UT) CAPS, Take 4,000 Units by mouth daily., Disp: , Rfl:    Cyanocobalamin (VITAMIN B 12 PO), Take 1 tablet by mouth daily., Disp: , Rfl:    denosumab (PROLIA) 60 MG/ML SOLN injection, Inject 60 mg into the skin every 6 (six) months. Administer in upper arm, thigh, or abdomen, Disp: , Rfl:    EPINEPHrine 0.3 mg/0.3 mL IJ SOAJ injection, Inject 0.3 mLs (0.3 mg total) into the muscle as needed for anaphylaxis., Disp: 2 each, Rfl: 1   FEROSUL 325 (65 Fe) MG tablet, TAKE ONE TABLET BY MOUTH  DAILY WITH VITAMIN C., Disp: 30 tablet, Rfl: 2   lamoTRIgine (LAMICTAL) 200 MG tablet, Take 1 tablet (200 mg total) by mouth 2 (two) times daily., Disp: 180 tablet, Rfl: 3   meloxicam (MOBIC) 15 MG tablet, Take 1 tablet (15 mg total) by mouth daily., Disp: 30 tablet, Rfl: 0   omega-3 acid ethyl esters (LOVAZA) 1 g capsule, Take 1 g by mouth daily., Disp: , Rfl:    rosuvastatin (CRESTOR) 20 MG tablet, TAKE 1 TABLET  BY MOUTH DAILY, Disp: 90 tablet, Rfl: 3   Semaglutide-Weight Management (WEGOVY) 0.25 MG/0.5ML SOAJ, Inject 0.25 mg into the skin once a week., Disp: 2 mL, Rfl: 0   Turmeric (QC TUMERIC COMPLEX PO), Take 1 tablet by mouth daily., Disp: , Rfl:    UNABLE TO FIND, Take 1 tablet by mouth 2 (two) times daily. Calcium magnesium intensive care, Disp: , Rfl:    vitamin E 600 UNIT capsule, Take 600 Units by mouth daily., Disp: , Rfl:    Objective:     Vitals:   11/16/23 0940  BP: 132/80  Pulse: 68  SpO2: 100%  Weight: 204 lb (92.5 kg)  Height: 5\' 6"  (1.676 m)      Body mass index is 32.93 kg/m.    Physical Exam:    Neck Exam: Cervical Spine- Posture normal Skin- normal, intact  Neuro:  Strength-  Right Left   Deltoid (C5) 5/5 5/5  Bicep/Brachioradialis (C5/6) 5/5  5/5  Wrist Extension (C6) 5/5 5/5  Tricep (C7) 5/5 5/5  Wrist Flexion (C7) 5/5 5/5  Grip (C8) 5/5 5/5  Finger Abduction (T1) 5/5 5/5   Sensation: Mildly decreased sensation to right upper extremity compared to left globally in upper extremities bilaterally  Spurling's:  negative bilaterally Neck ROM: Full active ROM NTTP: cervical spinous processes, cervical paraspinal, thoracic paraspinal, trapezius  Gen: Appears well, nad, nontoxic and pleasant Neuro:sensation intact, strength is 5/5 with df/pf/inv/ev, muscle tone wnl Skin: no suspicious lesion or defmority Psych: A&O, appropriate mood and affect  right Shoulder:  No deformity, swelling or muscle wasting No scapular winging FF 160, abd 80, int 20, ext 80 NTTP over the [place], clavicle, ac, coracoid, biceps groove, humerus, deltoid, trapezius, cervical spine Positive empty can Neg ant drawer, sulcus sign, apprehension   Electronically signed by:  Summer [name] D.[name] 10:19 AM 11/16/23

## 2023-11-16 ENCOUNTER — Ambulatory Visit (INDEPENDENT_AMBULATORY_CARE_PROVIDER_SITE_OTHER): Payer: Medicare Other

## 2023-11-16 ENCOUNTER — Ambulatory Visit: Payer: Medicare Other | Admitting: Sports Medicine

## 2023-11-16 ENCOUNTER — Other Ambulatory Visit (HOSPITAL_BASED_OUTPATIENT_CLINIC_OR_DEPARTMENT_OTHER): Payer: Self-pay

## 2023-11-16 VITALS — BP 132/80 | HR 68 | Ht 66.0 in | Wt 204.0 lb

## 2023-11-16 DIAGNOSIS — M25511 Pain in right shoulder: Secondary | ICD-10-CM | POA: Diagnosis not present

## 2023-11-16 DIAGNOSIS — M5030 Other cervical disc degeneration, unspecified cervical region: Secondary | ICD-10-CM

## 2023-11-16 DIAGNOSIS — M19011 Primary osteoarthritis, right shoulder: Secondary | ICD-10-CM | POA: Diagnosis not present

## 2023-11-16 DIAGNOSIS — G8929 Other chronic pain: Secondary | ICD-10-CM

## 2023-11-16 DIAGNOSIS — M542 Cervicalgia: Secondary | ICD-10-CM

## 2023-11-16 DIAGNOSIS — M25519 Pain in unspecified shoulder: Secondary | ICD-10-CM

## 2023-11-16 MED ORDER — MELOXICAM 15 MG PO TABS
15.0000 mg | ORAL_TABLET | Freq: Every day | ORAL | 0 refills | Status: DC
  Filled 2023-11-16: qty 30, 30d supply, fill #0

## 2023-11-16 NOTE — Patient Instructions (Signed)
-   Start meloxicam 15 mg daily x2 weeks.  If still having pain after 2 weeks, complete 3rd-week of NSAID. May use remaining NSAID as needed once daily for pain control.  Do not to use additional over-the-counter NSAIDs (ibuprofen, naproxen, Advil, Aleve) while taking prescription NSAIDs.  May use Tylenol [phone] mg 2 to 3 times a day for breakthrough pain. Neck and shoulder HEP  Pt referral  4 week follow up

## 2023-11-17 ENCOUNTER — Other Ambulatory Visit: Payer: Medicare Other

## 2023-11-17 ENCOUNTER — Encounter: Payer: Self-pay | Admitting: Sports Medicine

## 2023-11-24 ENCOUNTER — Other Ambulatory Visit (HOSPITAL_BASED_OUTPATIENT_CLINIC_OR_DEPARTMENT_OTHER): Payer: Medicare Other

## 2023-11-26 ENCOUNTER — Other Ambulatory Visit (HOSPITAL_COMMUNITY): Payer: Self-pay

## 2023-11-26 ENCOUNTER — Other Ambulatory Visit: Payer: Medicare Other

## 2023-11-30 ENCOUNTER — Ambulatory Visit: Payer: Medicare Other

## 2023-11-30 ENCOUNTER — Ambulatory Visit
Admission: RE | Admit: 2023-11-30 | Discharge: 2023-11-30 | Discharge status: Home / Self Care | Payer: Medicare Other | Source: Ambulatory Visit | Attending: Physician Assistant

## 2023-11-30 DIAGNOSIS — N644 Mastodynia: Secondary | ICD-10-CM

## 2023-11-30 LAB — HEPATIC FUNCTION PANEL
ALT: 19 [IU]/L (ref 0–32)
AST: 24 [IU]/L (ref 0–40)
Albumin: 4.3 g/dL (ref 3.8–4.8)
Alkaline Phosphatase: 70 [IU]/L (ref 44–121)
Bilirubin Total: 0.5 mg/dL (ref 0.0–1.2)
Bilirubin, Direct: 0.18 mg/dL (ref 0.00–0.40)
Total Protein: 6.3 g/dL (ref 6.0–8.5)

## 2023-11-30 LAB — LIPID PANEL
Chol/HDL Ratio: 2 {ratio} (ref 0.0–4.4)
Cholesterol, Total: 140 mg/dL (ref 100–199)
HDL: 70 mg/dL (ref >39)
LDL Chol Calc (NIH): 57 mg/dL (ref 0–99)
Triglycerides: 62 mg/dL (ref 0–149)
VLDL Cholesterol Cal: 13 mg/dL (ref 5–40)

## 2023-12-01 ENCOUNTER — Encounter (HOSPITAL_BASED_OUTPATIENT_CLINIC_OR_DEPARTMENT_OTHER): Payer: Self-pay | Admitting: Nurse Practitioner

## 2023-12-01 ENCOUNTER — Encounter: Payer: Self-pay | Admitting: Physician Assistant

## 2023-12-01 ENCOUNTER — Ambulatory Visit (HOSPITAL_BASED_OUTPATIENT_CLINIC_OR_DEPARTMENT_OTHER): Payer: Medicare Other | Admitting: Nurse Practitioner

## 2023-12-01 VITALS — BP 130/76 | HR 74 | Ht 66.0 in | Wt 210.0 lb

## 2023-12-01 DIAGNOSIS — R0989 Other specified symptoms and signs involving the circulatory and respiratory systems: Secondary | ICD-10-CM | POA: Diagnosis not present

## 2023-12-01 DIAGNOSIS — E785 Hyperlipidemia, unspecified: Secondary | ICD-10-CM

## 2023-12-01 DIAGNOSIS — R931 Abnormal findings on diagnostic imaging of heart and coronary circulation: Secondary | ICD-10-CM | POA: Diagnosis not present

## 2023-12-01 DIAGNOSIS — I2510 Atherosclerotic heart disease of native coronary artery without angina pectoris: Secondary | ICD-10-CM

## 2023-12-01 DIAGNOSIS — M542 Cervicalgia: Secondary | ICD-10-CM

## 2023-12-01 NOTE — Patient Instructions (Signed)
Medication Instructions:   Your physician recommends that you continue on your current medications as directed. Please refer to the Current Medication list given to you today.   *If you need a refill on your cardiac medications before your next appointment, please call your pharmacy*   Lab Work:  Your physician recommends that you return for a FASTING lipid profile/cmet fasting after midnight in 6 months prior to your 6 month follow up. I will send you a mychart message in June with appointment time or date and lab orders with either Dr. Rennis Golden or Lebron Conners.   If you have labs (blood work) drawn today and your tests are completely normal, you will receive your results only by: MyChart Message (if you have MyChart) OR A paper copy in the mail If you have any lab test that is abnormal or we need to change your treatment, we will call you to review the results.   Testing/Procedures:  Your physician has requested that you have a carotid duplex. This test is an ultrasound of the carotid arteries in your neck. It looks at blood flow through these arteries that supply the brain with blood. Allow one hour for this exam. There are no restrictions or special instructions.    Follow-Up: At [facility], you and your health needs are our priority.  As part of our continuing mission to provide you with exceptional heart care, we have created designated Provider Care Teams.  These Care Teams include your primary Cardiologist (physician) and Advanced Practice Providers (APPs -  Physician Assistants and Nurse Practitioners) who all work together to provide you with the care you need, when you need it.  We recommend signing up for the patient portal called "MyChart".  Sign up information is provided on this After Visit Summary.  MyChart is used to connect with patients for Virtual Visits (Telemedicine).  Patients are able to view lab/test results, encounter notes, upcoming appointments,  etc.  Non-urgent messages can be sent to your provider as well.   To learn more about what you can do with MyChart, go to [email or web address].    Your next appointment:   6 month(s)  Provider:   K. Italy Hilty, MD or Eligha Bridegroom, NP    Other Instructions  Please see lab instructions.

## 2023-12-01 NOTE — Progress Notes (Signed)
 Cardiology Office Note:  .   Date:  12/01/2023  ID:  Summer [name] [place], DOB 1951-01-29, MRN [id number] PCP: Allwardt, Mardy HERO, PA-C  [facility] Providers Cardiologist:  Alm Clay, MD    Patient Profile: .      PMH Hyperlipidemia Nuclear stress test 10/2017 Low risk Coronary artery disease CT Calcium  score 03/10/2022 CAC score 896 (96th percentile) LM 0, LAD 426, LCx 299, RCA 171 IDDA Prior Etoh abuse Sober since 2014 Seizures  Referred to advanced lipid disorder clinic and seen by Dr. Mona 07/10/2022.  She has a history of dyslipidemia and underwent calcium  scoring and May 2023 for further risk stratification.  Calcium  score was elevated at 896 (96 percentile).  She had not been on any lipid-lowering therapy prior to this.  Her lipid profile at that time showed total cholesterol 188, triglycerides 68, HDL 60 and LDL 115.  No family history of early onset heart disease.  She typically tries to eat a diet low in saturated fat.  She exercises fairly regularly and was asymptomatic.  She was advised to start rosuvastatin  20 mg daily which was compatible with her antiseizure medications.  She was also advised to start aspirin  81 mg daily.  At return office visit 10/13/2022 she had significant improvement in lipids with total cholesterol now 166, triglycerides 79, HDL 74, and LDL 77.  Her LDL particle number was low at 952 with a small LDL particle number at 375.  Fortunately her LP(a) was negative at 32 nmol/L.       History of Present Illness: [name] is a very pleasant 73 y.o. female who is here today for follow-up of dyslipidemia.  Lipid panel from 11/29/2023 reveals total cholesterol 140, triglycerides 62, HDL 70, and LDL-C 57. She reports she is feeling well with the exception of a thumping sensation in the right side of her neck.  It is not constant. She also notes occasional right arm pain. She reports a 15 lb weight gain since October 2024 and is planning to  start GLP-1 agonist but is awaiting PA from prescriber. Admits she has not been as active with regular exercise, used to come to [facility]. Her diet is overall healthy. She has persistent LLE swelling from prior knee replacement.  She denies chest pain, shortness of breath, orthopnea, PND, edema, presyncope, or syncope.  ROS: See HPI       Studies Reviewed: .         Risk Assessment/Calculations:             Physical Exam:   VS:  BP 130/76   Pulse 74   Ht 5' 6 (1.676 m)   Wt 210 lb (95.3 kg)   SpO2 98%   BMI 33.89 kg/m    Wt Readings from Last 3 Encounters:  12/01/23 210 lb (95.3 kg)  11/16/23 204 lb (92.5 kg)  10/26/23 200 lb 9.6 oz (91 kg)    GEN: Obese, well developed in no acute distress NECK: No JVD; + left carotid bruits CARDIAC: RRR, no murmurs, rubs, gallops RESPIRATORY:  Clear to auscultation without rales, wheezing or rhonchi  ABDOMEN: Soft, non-tender, non-distended EXTREMITIES:  No edema; No deformity     ASSESSMENT AND PLAN: .    Dyslipidemia LDL goal < 70: Lipid profile from 11/29/2023 revealed total cholesterol 140, triglycerides 62, HDL 70, LDL-C 57.  Lipids are well-controlled on current therapy of rosuvastatin  20 mg and Lovaza 1 g daily with no concerning side effects.  Focus on secondary prevention of ASCVD including heart healthy mostly plant based diet avoiding saturated fat, processed foods, simple carbohydrates, and sugar along with aiming for at least 150 minutes of moderate intensity exercise each week.   Neck discomfort/left carotid bruit: She reports occasional thumping sensation in the right side of her neck.  On exam she has a left carotid bruit.  She denies dizziness, presyncope or syncope.  We will get a carotid duplex to ensure no stenosis of major neck arteries.  CAD without angina/Coronary artery calcification: CT calcium  score 02/2022 with CAC of 896 (96th percentile).  She had a low risk Myoview  in 2019.  She denies chest pain, dyspnea, or other  symptoms concerning for angina.  No indication for further ischemic evaluation at this time. Lengthy discussion about gradual progression of exercise with goal to achieve 150 minutes of moderate intensity exercise each week.  She plans to resume workouts at [facility].  No bleeding concerns.  Continue aspirin , rosuvastatin , and Lovaza.        Disposition:6 months with Dr. Mona or me  Signed, Rosaline Bane, NP-C

## 2023-12-09 ENCOUNTER — Ambulatory Visit: Payer: Medicare Other

## 2023-12-11 ENCOUNTER — Encounter: Payer: Self-pay | Admitting: Cardiology

## 2023-12-13 ENCOUNTER — Encounter: Payer: Self-pay | Admitting: Neurology

## 2023-12-14 ENCOUNTER — Ambulatory Visit (HOSPITAL_BASED_OUTPATIENT_CLINIC_OR_DEPARTMENT_OTHER)
Admission: RE | Admit: 2023-12-14 | Discharge: 2023-12-14 | Disposition: A | Discharge status: Home / Self Care | Payer: Medicare Other | Source: Ambulatory Visit | Attending: Physician Assistant | Admitting: Physician Assistant

## 2023-12-14 ENCOUNTER — Telehealth (HOSPITAL_BASED_OUTPATIENT_CLINIC_OR_DEPARTMENT_OTHER): Payer: Self-pay | Admitting: Physical Therapy

## 2023-12-14 DIAGNOSIS — M859 Disorder of bone density and structure, unspecified: Secondary | ICD-10-CM

## 2023-12-14 DIAGNOSIS — Z780 Asymptomatic menopausal state: Secondary | ICD-10-CM

## 2023-12-14 NOTE — Telephone Encounter (Signed)
LVM requesting pt call back to r/s physical therapy appt on 02.19 due to inclement weather

## 2023-12-15 ENCOUNTER — Other Ambulatory Visit: Payer: Self-pay

## 2023-12-15 ENCOUNTER — Ambulatory Visit (HOSPITAL_BASED_OUTPATIENT_CLINIC_OR_DEPARTMENT_OTHER): Payer: Medicare Other | Attending: Sports Medicine | Admitting: Physical Therapy

## 2023-12-15 ENCOUNTER — Encounter (HOSPITAL_BASED_OUTPATIENT_CLINIC_OR_DEPARTMENT_OTHER): Payer: Self-pay | Admitting: Physical Therapy

## 2023-12-15 DIAGNOSIS — G8929 Other chronic pain: Secondary | ICD-10-CM | POA: Insufficient documentation

## 2023-12-15 DIAGNOSIS — M25511 Pain in right shoulder: Secondary | ICD-10-CM | POA: Insufficient documentation

## 2023-12-15 DIAGNOSIS — M542 Cervicalgia: Secondary | ICD-10-CM | POA: Diagnosis present

## 2023-12-15 DIAGNOSIS — M19011 Primary osteoarthritis, right shoulder: Secondary | ICD-10-CM | POA: Diagnosis not present

## 2023-12-15 DIAGNOSIS — M25611 Stiffness of right shoulder, not elsewhere classified: Secondary | ICD-10-CM | POA: Insufficient documentation

## 2023-12-15 DIAGNOSIS — M6281 Muscle weakness (generalized): Secondary | ICD-10-CM | POA: Insufficient documentation

## 2023-12-15 DIAGNOSIS — M5030 Other cervical disc degeneration, unspecified cervical region: Secondary | ICD-10-CM | POA: Insufficient documentation

## 2023-12-15 NOTE — Therapy (Signed)
OUTPATIENT PHYSICAL THERAPY UPPER EXTREMITY EVALUATION   Patient Name: Summer [name] MRN: [id number] DOB:[birth year], 73 y.o., female Today's Date: 12/15/2023  END OF SESSION:  PT End of Session - 12/15/23 1244     Visit Number 1    Number of Visits 18    Date for PT Re-Evaluation 03/14/24    Authorization Type BCBS MCR    PT Start Time 0800    PT Stop Time 0845    PT Time Calculation (min) 45 min    Activity Tolerance Patient tolerated treatment well    Behavior During Therapy WFL for tasks assessed/performed             Past Medical History:  Diagnosis Date   Adenomatous polyp of sigmoid colon 03/18/2017   Overview:  Added automatically from request for surgery [id number]   Agatston coronary artery calcium score greater than 400 01/28/2022   Coronary Calcium Score 896 (LAD 426, LCx 299, RCA 171).  96th percentile.   Alcoholism (HCC)    01/18/18 - no use in 5 years   Asymptomatic varicose veins 01/11/2017   Cancer (HCC)    skin ca   Iron deficiency anemia 01/12/2017   Last Assessment & Plan:  Given script for ferrous sulfate and order for repeat cbc with diff and iron studies to be done in 1 month Will also refer pt back to [facility] for repeat colonoscopy and to dietician for nutritional counseling, per pt request   Lentigines 01/11/2017   Melanocytic nevi of left lower limb, including hip 01/11/2017   Melanocytic nevi of left upper limb, including shoulder 01/11/2017   Melanocytic nevi of right lower limb, including hip 01/11/2017   Melanocytic nevi of right upper limb, including shoulder 01/11/2017   Melanocytic nevi of trunk 01/11/2017   Obesity (BMI 30-39.9) 05/21/2017   Osteoporosis    Plantar fasciitis    Primary osteoarthritis, right shoulder 12/11/2016   Seizure (HCC)    LAST SEIZURE- 2016 per pt   Sleep apnea    no cpap per pt   Statins contraindicated 10/2022   Tolerating rosuvastatin.   Unilateral primary osteoarthritis, left knee 12/11/2016   Past  Surgical History:  Procedure Laterality Date   BARIATRIC SURGERY     COLONOSCOPY  03/18/2017   in [place].   ECTOPIC PREGNANCY SURGERY     NM MYOVIEW LTD  10/2017   Normal EF.  LOW RISK.  No ischemia or infarction.  No RWMA.   POLYPECTOMY     SIGMOIDOSCOPY     in [place].   TOTAL KNEE ARTHROPLASTY Left 09/07/2017   Procedure: LEFT TOTAL KNEE ARTHROPLASTY;  Surgeon: Durene Romans, MD;  Location: [facility];  Service: Orthopedics;  Laterality: Left;  Adductor Block   Patient Active Problem List   Diagnosis Date Noted   Right-sided chest pain 07/27/2023   Low bone density 07/16/2023   Hx of osteoporosis 09/11/2022   Agatston coronary artery calcium score greater than 400 01/28/2022   Epilepsy (HCC) 12/31/2020   Right wrist pain 09/05/2020   Left wrist pain 09/05/2020   Hyperlipidemia with target low density lipoprotein (LDL) cholesterol less than 70 mg/dL 91/47/8295   Vitamin D deficiency 07/07/2019   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   Facial paresthesia 01/18/2018   Knee pain 11/03/2017   History of total knee arthroplasty 09/07/2017   Obesity with body mass index 30 or greater 05/21/2017   Adenomatous polyp of colon 03/18/2017   Family history of colon cancer in father  02/23/2017   Iron deficiency anemia 01/12/2017   Neck pain 01/11/2017   Osteoporosis 01/11/2017   Lentiginosis 01/11/2017   Osteoarthritis of right glenohumeral joint 12/11/2016   Osteoarthritis of right shoulder 12/11/2016    PCP: Allwardt, Crist Infante, PA-C   REFERRING PROVIDER:  Richardean Sale, DO     REFERRING DIAG:  Diagnosis  M50.30 (ICD-10-CM) - DDD (degenerative disc disease), cervical  M54.2 (ICD-10-CM) - Neck pain  M25.511,G89.29 (ICD-10-CM) - Chronic right shoulder pain  M19.011 (ICD-10-CM) - Osteoarthritis of right glenohumeral joint    THERAPY DIAG:  Cervicalgia  Decreased right shoulder range of motion  Chronic right shoulder pain  Muscle weakness  (generalized)  Rationale for Evaluation and Treatment: Rehabilitation  ONSET DATE: >5 years  SUBJECTIVE:                                                                                                                                                                                      SUBJECTIVE STATEMENT:  Pt reports history of shoulder pain that has been >5 years. Recently has increased in discomfort, weakness, and difficulty reaching OH and behind back. Was previously a Armed forces operational officer in college. Pt reports improvement in pain with MD provided exercise and gym. Pain is worse at end of the day. No NT and no sudden onset of weakness into the hand. Shoulder stiffens and tightens with inactivity. Has neck pain into the R side of the neck and shoulder.  Gym: recumbent bike, pulley, lat pull down, back extensions,   Hand dominance: Right  PERTINENT HISTORY: L TKA , neck pain, osteoporosis  PAIN:  Are you having pain? Yes: NPRS scale: 3/10  Pain location: R shoulder and neck Pain description: achey Aggravating factors: sleeping on R, reaching for a shelf,  Relieving factors: resting, exercise, meds  PRECAUTIONS: Other: low bone density  RED FLAGS: Cervical red flags: none    WEIGHT BEARING RESTRICTIONS: No  FALLS:  Has patient fallen in last 6 months? No  LIVING ENVIRONMENT: Lives with: lives with their spouse  OCCUPATION: Retired from [employer] for grandchildren  PLOF: Independent  PATIENT GOALS: reduce R shoulder pain with reaching; continue with gym, prevent worsening of pain   OBJECTIVE:  Note: Objective measures were completed at Evaluation unless otherwise noted.  DIAGNOSTIC FINDINGS:    FINDINGS: There is diffuse decreased bone mineralization. Moderate glenohumeral joint space narrowing, subchondral sclerosis, and peripheral osteophytosis. Mild-to-moderate acromioclavicular joint space narrowing and peripheral osteophytosis. No acute fracture  or dislocation. The visualized portion of the right lung is unremarkable.   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  PATIENT SURVEYS :  DASH Score: 39.2 / 100  COGNITION: Overall cognitive status: Within functional limits for tasks assessed     SENSATION: WFL  POSTURE: Fwd head, increased kyphosis, rounded shoulder  CERVICAL ROM:   Active ROM A/PROM (deg) eval  Flexion 75%  Extension 60%  Right lateral flexion 50%  Left lateral flexion 50%  Right rotation 50%  Left rotation 50%   (Blank rows = not tested)   UPPER EXTREMITY ROM:   Active ROM Right eval Left eval  Shoulder flexion 143 165  Shoulder extension    Shoulder abduction 155 160  Shoulder adduction    Shoulder internal rotation SIJ with reach T6 with reach  Shoulder external rotation C6  T2 reach  (Blank rows = not tested)  UPPER EXTREMITY MMT:  MMT HHD in lbs Right eval Left eval  Shoulder flexion 19.2 28.2  Shoulder extension 35.1 40.2  Shoulder abduction 15.5 21.0  Shoulder adduction    Shoulder internal rotation    Shoulder external rotation 10.8 18.9  (Blank rows = not tested)  SHOULDER SPECIAL TESTS: Impingement tests: Neer impingement test: positive  and Painful arc test: positive  Rotator cuff assessment: Drop arm test: negative and Empty can test: negative Biceps assessment: Speed's test: negative  JOINT MOBILITY TESTING:  R GHJ stiffness in all directions  PALPATION:  TTP of R UT, deltoid, infra with noticeable decreased muscle bulk size of infra                                                                                                                             TREATMENT DATE: 2/19  R GHJ inf mob grade III  Exercises - Shoulder External Rotation and Scapular Retraction with Resistance  - 1 x daily - 7 x weekly - 3 sets - 10 reps - Standing Shoulder Row with Anchored Resistance  - 1 x daily - 7 x weekly - 3 sets - 10  reps - Doorway Pec Stretch at 60 Degrees Abduction with Arm Straight  - 2 x daily - 7 x weekly - 1 sets - 3 reps - 30 hold - Standing Shoulder Posterior Capsule Stretch  - 2 x daily - 7 x weekly - 1 sets - 3 reps - 30 hold  Edu regarding current gym exercise and safety: avoiding of impact, sudden high intensity, and high speed activity  PATIENT EDUCATION: Education details: MOI, diagnosis, prognosis, anatomy, exercise progression, DOMS expectations, muscle firing,  envelope of function, HEP, POC Person educated: Patient Education method: Explanation, Demonstration, Tactile cues, Verbal cues, and Handouts Education comprehension: verbalized understanding, returned demonstration, verbal cues required, tactile cues required, and needs further education     HOME EXERCISE PROGRAM: Access Code: [email or web address] URL: [email or web address] Date: 12/15/2023 Prepared by: Zebedee Iba ASSESSMENT:   CLINICAL IMPRESSION: Patient is a 73 y.o. female who was seen today for physical therapy evaluation and treatment for c/c of R shoulder and neck pain. 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 Clinical  testing and subjective does not appear to be radiculopathy at this time. R sided neck pain seems likely due to soft tissue tightness and hypertonicity. Pt's pain is moderately sensitive and irritable with movement. Pt does respond well to manual therapy and self stretching at initial session. Pt is more stiffness dominant and strength limited at this time. Plan to continue with R GHJ mobilizations, R cuff strength, and machine/gym exercise progression at future sessions. Pt would benefit from continued skilled therapy in order to reach goals and maximize functional R UE strength and ROM for full return to PLOF.      OBJECTIVE IMPAIRMENTS decreased activity tolerance, decreased ROM, decreased strength, increased muscle spasms, impaired UE functional use, improper body mechanics,  postural dysfunction, and pain.    ACTIVITY LIMITATIONS cleaning, lifting, bending, carry, dressing, bathing, community activity, meal prep, laundry, caregiver duties    PERSONAL FACTORS Time since onset of injury/illness/exacerbation and 2+ comorbidity: are also affecting patient's functional outcome.     REHAB POTENTIAL: Good   CLINICAL DECISION MAKING: Stable/uncomplicated   EVALUATION COMPLEXITY: Low     GOALS:     SHORT TERM GOALS: Target date: 01/26/2024        Pt will become independent with HEP in order to demonstrate synthesis of PT education.   Goal status: INITIAL   2.  Pt will decrease quick DASH score by at least 8% in order to demonstrate clinically significant reduction in disability.    Goal status: INITIAL   3.  Pt will report at least 2 pt reduction on NPRS scale for pain in order to demonstrate functional improvement with household activity, self care, and ADL.    Goal status: INITIAL     LONG TERM GOALS: Target date: 03/08/2024      Pt  will become independent with final HEP in order to demonstrate synthesis of PT education.   Goal status: INITIAL   2.  Pt will be able to reach [facility] and carry/hold >10lbs in order to demonstrate functional improvement in R UE strength for return to PLOF and exercise.    Goal status: INITIAL   3.  Pt will be able to demonstrate symmetrical OH AROM of the R shoulder  without pain in order to demonstrate functional improvement in UE function for self-care and house hold duties.    Goal status: INITIAL   4.  Pt will be able to demonstrate improvement in 10% of cervical ROM without pain in order to demonstrate functional improvement in UE function for self-care and house hold duties.   Goal status: INITIAL      PLAN: PT FREQUENCY: 1-2x/week   PT DURATION: 12 weeks   PLANNED INTERVENTIONS: Therapeutic exercises, Therapeutic activity, Neuromuscular re-education, Patient/Family education, Joint mobilization, Dry  Needling, Spinal mobilization, Cryotherapy, Moist heat, Taping, and Manual therapy, Re-evaluation   PLAN FOR NEXT SESSION:  R GHJ inf mobs (improves ROM at eval); cervical mobs, chin tucks, R IR stretching, review chest supported row and/or cable row    Zebedee Iba, PT 12/15/2023, 1:47 PM

## 2023-12-16 ENCOUNTER — Encounter: Payer: Self-pay | Admitting: Physician Assistant

## 2023-12-16 NOTE — Telephone Encounter (Signed)
Please see pt msg regarding Dexa scan and past results and advise patient

## 2023-12-20 NOTE — Progress Notes (Unsigned)
 Patient: Summer [name] Date of Birth: 01/18/1951  Reason for Visit: Follow up for seizure History from: Patient Primary Neurologist: Dr.Yan  ASSESSMENT AND PLAN 73 y.o. year old female   1.  Complex partial seizure with secondary generalization, last seizure was in 2016  -Simplify seizure medication regimen due to no seizures since 2016, no longer drinking alcohol, under treatment for osteoporosis on Prolia to minimize potential impact of dual seizure medication on bone loss  -Increase Lamictal 200 mg twice daily x 1 week continue Keppra 500 mg twice daily, then decrease Keppra 250 mg AM/500 mg PM x 2 weeks, then decrease Keppra 250 mg BID x 2 weeks, then stop Keppra, continue on Lamictal 200 mg twice daily as single agent -EEG normal June 2024 -Reviewed plan with Dr. Terrace Arabia after EEG resulted, Lamictal felt to be superior seizure medication -Call for seizure activity, otherwise keep follow-up appointment in December with me  HISTORY  Summer [name] is a 73 year old female, seen in refer by her primary care doctor Helane Rima, for evaluation of seizure, facial paresthesia, initial evaluation was on January 18, 2018.   She had a history of osteoporosis, is taking Prolia 60 mg every 6 months   History of complex partial seizure with secondary generalization   I reviewed previous neurology evaluation from [facility] clinic by Dr.Jehi   First seizure was in the labor room at age 29 after delivery, no warning signs,   Around 2015, she started binge drink for 3-4 years, she had recurrent seizure in February 2015, patient was amnestic of the event, husband got a call from her primary care physician the patient had a seizure during her routine appointment, with tongue biting, was admitted to [facility],     CT, MRI of the brain with and without contrast showed mild supratentorium small vessel disease, EEG showed no acute abnormality, no antiepileptic medication was given,  record mentions that was thought to be due to alcohol withdrawal, patient stated that she was drinking heavily, hard liquor on a daily basis, but did stop drinking for weeks prior to the event in February 2015.   Second event July 2015, was at a work picnic, then woke up on the hospital, was witnessed by a physician there that she had a seizure, loss of consciousness with tonic-clonic activity of the extremities that lasted 5 minutes, followed by 30-60 minutes post ictal confusion, Keppra was started at 750 twice a day, later was increased to 1000 mg twice a day later, patient complains of feeling sleepy,   Third event was in [place], she was witnessed by her friend that she has transient episode of confusion,   Her medication was later changed to lamotrigine 150 mg twice a day, Keppra 500 mg twice daily, stayed on that regimen since, she is tolerating the medication well, there was no recurrent seizures.   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   Today she also complains of more than 3 years history of constant right facial paresthesia, burning hypersensitivity, mild asymmetry of the face, she noticed more wrinkles in her left lower face while woke up in the morning time.   Laboratory evaluation in January 2019 showed mild elevated LDL 101, negative troponin, INR 1.01, d-dimer was mildly elevated at 0.95, negative troponin, normal BMP, CBC    UPDATE Sept 5 2019: She is  doing very well, had no recurrent seizure, has quit drinking. Now taking keppra 500mg  bid and lamotrigine 150mg  bid.    We personally reviewed MRI brain w/wo in May 2019, there are chronic small vessel disease, no acute abnormalities.   EEG was normal in June 2019   Update November 13, 2019 SS: Since last seen, she remained on  both Lamictal and Keppra, she was nervous to come off the Lamictal quickly, after speaking with [facility] clinic she decided to remain on dual therapy.  She has continued to do well, has not had recurrent seizure.  She is open to coming off the Lamictal, if done slowly.  She has continued right-sided facial paresthesia, pressure to right face, some asymmetry on the right x 3 years.  Overall, has been doing well, has her grandson with her today.    Update November 14, 2020 SS: Here today for follow-up via virtual visit, continues to do well, no recurrent seizures (last in 2016ish).  Has decided to remain on Keppra 500 mg twice a day, Lamictal 150 mg twice a day.  Previously when she saw Dr. Terrace Arabia in 2019, decided to taper off Lamictal, do higher dose Keppra 500/1000.  She felt more comfortable with dual treatment, worried she would have a seizure, lose her driver's license in [place]. Moved from [place]. She takes care of her 2 grandkids during the day.  Is on vitamin D supplement.  Doing well, sees Dr. Artis Flock PCP.   Update November 19, 2021 SS: Here today via telephone visit, couldn't connect via my chart. Couldn't come into the office due to COVID exposure. Health is good. Going to the gym 4-5 days a week taking aerobics class. No seizures. Last was in 2015. Hasn't wanted to reduce AED to single treatment.  Takes care of her grandchildren. Remains on Vitamin D, drives. On oral iron.   Update 04/01/22 SS: Doing well, remains on Keppra 500 mg twice a day, Lamictal 150 mg twice daily.  Has decided to stay on dual treatment, even though in 2019 plan was to increase Keppra 500/1000 mg daily, taper off Lamictal.  She cares for her to prevent she is on Prolia, takes vitamin D.  She is very active, goes to the gym.  Has no adverse effects from AED.   Update April 07, 2023 SS: Doing water fit classes 5 days a week, swimming lessons, goes to [employer].  No seizures since 2016.  She remains on Keppra 500 mg twice a day, Lamictal 150 mg  twice daily.  She would like to discuss reducing or moving to single agent.  Update May 25, 2023 SS: Here to discuss moving to single agent seizure medication.  Currently on Keppra 500 mg twice daily, Lamictal 150 mg twice daily.  Last seizure was in 2016.  All her seizures have occurred when drinking alcohol.  Since 2016 has been committed to her sobriety.  She currently has osteoporosis.  EEG was normal June 2024.  Lamictal level 8.2.  Update December 21, 2023 SS:   REVIEW OF SYSTEMS: Out of a complete 14 system review of symptoms, the patient complains only of the following symptoms, and all other reviewed systems are negative.  See HPI  ALLERGIES: Allergies  Allergen Reactions   Bee Venom Swelling    HOME MEDICATIONS: Outpatient Medications Prior to Visit  Medication Sig Dispense Refill   Ascorbic Acid (VITAMIN C) 100 MG tablet Take 100 mg by mouth daily.     aspirin EC 81 MG tablet Take  81 mg by mouth daily. Swallow whole.     Calcium-Vitamin D-Vitamin K [id number] MG-UNT-MCG TABS Take 1 tablet by mouth 2 (two) times daily. Also includes Vitamin C     Cholecalciferol 100 MCG (4000 UT) CAPS Take 4,000 Units by mouth daily.     Cyanocobalamin (VITAMIN B 12 PO) Take 1 tablet by mouth daily.     denosumab (PROLIA) 60 MG/ML SOLN injection Inject 60 mg into the skin every 6 (six) months. Administer in upper arm, thigh, or abdomen     EPINEPHrine 0.3 mg/0.3 mL IJ SOAJ injection Inject 0.3 mLs (0.3 mg total) into the muscle as needed for anaphylaxis. 2 each 1   FEROSUL 325 (65 Fe) MG tablet TAKE ONE TABLET BY MOUTH DAILY WITH VITAMIN C. 30 tablet 2   lamoTRIgine (LAMICTAL) 200 MG tablet Take 1 tablet (200 mg total) by mouth 2 (two) times daily. 180 tablet 3   meloxicam (MOBIC) 15 MG tablet Take 1 tablet (15 mg total) by mouth daily. 30 tablet 0   omega-3 acid ethyl esters (LOVAZA) 1 g capsule Take 1 g by mouth daily.     rosuvastatin (CRESTOR) 20 MG tablet TAKE 1 TABLET BY MOUTH DAILY 90  tablet 3   Semaglutide-Weight Management (WEGOVY) 0.25 MG/0.5ML SOAJ Inject 0.25 mg into the skin once a week. (Patient not taking: Reported on 12/01/2023) 2 mL 0   Turmeric (QC TUMERIC COMPLEX PO) Take 1 tablet by mouth daily.     UNABLE TO FIND Take 1 tablet by mouth 2 (two) times daily. Calcium magnesium intensive care     vitamin E 600 UNIT capsule Take 600 Units by mouth daily.     No facility-administered medications prior to visit.    PAST MEDICAL HISTORY: Past Medical History:  Diagnosis Date   Adenomatous polyp of sigmoid colon 03/18/2017   Overview:  Added automatically from request for surgery [phone]   Agatston coronary artery calcium score greater than 400 01/28/2022   Coronary Calcium Score 896 (LAD 426, LCx 299, RCA 171).  96th percentile.   Alcoholism (HCC)    01/18/18 - no use in 5 years   Asymptomatic varicose veins 01/11/2017   Cancer (HCC)    skin ca   Iron deficiency anemia 01/12/2017   Last Assessment & Plan:  Given script for ferrous sulfate and order for repeat cbc with diff and iron studies to be done in 1 month Will also refer pt back to [facility] for repeat colonoscopy and to dietician for nutritional counseling, per pt request   Lentigines 01/11/2017   Melanocytic nevi of left lower limb, including hip 01/11/2017   Melanocytic nevi of left upper limb, including shoulder 01/11/2017   Melanocytic nevi of right lower limb, including hip 01/11/2017   Melanocytic nevi of right upper limb, including shoulder 01/11/2017   Melanocytic nevi of trunk 01/11/2017   Obesity (BMI 30-39.9) 05/21/2017   Osteoporosis    Plantar fasciitis    Primary osteoarthritis, right shoulder 12/11/2016   Seizure (HCC)    LAST SEIZURE- 2016 per pt   Sleep apnea    no cpap per pt   Statins contraindicated 10/2022   Tolerating rosuvastatin.   Unilateral primary osteoarthritis, left knee 12/11/2016    PAST SURGICAL HISTORY: Past Surgical History:  Procedure Laterality Date   BARIATRIC  SURGERY     COLONOSCOPY  03/18/2017   in [place].   ECTOPIC PREGNANCY SURGERY     [facility] MYOVIEW LTD  10/2017   Normal EF.  LOW  RISK.  No ischemia or infarction.  No RWMA.   POLYPECTOMY     SIGMOIDOSCOPY     in [place].   TOTAL KNEE ARTHROPLASTY Left 09/07/2017   Procedure: LEFT TOTAL KNEE ARTHROPLASTY;  Surgeon: Durene Romans, MD;  Location: [facility];  Service: Orthopedics;  Laterality: Left;  Adductor Block    FAMILY HISTORY: Family History  Problem Relation Age of Onset   Mitral valve prolapse Mother    Osteoporosis Mother    Memory loss Mother    Colon cancer Father 43   Alcoholism Father    Colon polyps Neg Hx    Esophageal cancer Neg Hx    Rectal cancer Neg Hx    Stomach cancer Neg Hx     SOCIAL HISTORY: Social History   Socioeconomic History   Marital status: Married    Spouse name: Not on file   Number of children: 1   Years of education: college   Highest education level: Bachelor's degree (e.g., BA, AB, BS)  Occupational History   Occupation: Retired  Tobacco Use   Smoking status: Never   Smokeless tobacco: Never  Vaping Use   Vaping status: Never Used  Substance and Sexual Activity   Alcohol use: No   Drug use: No   Sexual activity: Never    Birth control/protection: Post-menopausal  Other Topics Concern   Not on file  Social History Narrative   Lives at home with husband.   Right-handed.   3 cups caffeine per day.   Cares for 2 grandchildren 3 days per week    Social Drivers of Health   Financial Resource Strain: Low Risk  (08/13/2022)   Overall Financial Resource Strain (CARDIA)    Difficulty of Paying Living Expenses: Not hard at all  Food Insecurity: No Food Insecurity (08/13/2022)   Hunger Vital Sign    Worried About Running Out of Food in the Last Year: Never true    Ran Out of Food in the Last Year: Never true  Transportation Needs: Low Risk (10/20/2022)   [facility])    Transportation    Has a lack of transportation kept you from medical appointments, meetings, work, or from getting things needed for daily living?: Not on file  Physical Activity: Sufficiently Active (08/13/2022)   Exercise Vital Sign    Days of Exercise per Week: 4 days    Minutes of Exercise per Session: 50 min  Stress: No Stress Concern Present (08/13/2022)   [employer] of Occupational Health - Occupational Stress Questionnaire    Feeling of Stress : Not at all  Social Connections: Moderately Integrated (08/13/2022)   Social Connection and Isolation Panel [NHANES]    Frequency of Communication with Friends and Family: More than three times a week    Frequency of Social Gatherings with Friends and Family: More than three times a week    Attends Religious Services: Never    Database administrator or Organizations: Yes    Attends Banker Meetings: 1 to 4 times per year    Marital Status: Married  Catering manager Violence: Not At Risk (08/13/2022)   Humiliation, Afraid, Rape, and Kick questionnaire    Fear of Current or Ex-Partner: No    Emotionally Abused: No    Physically Abused: No    Sexually Abused: No   PHYSICAL EXAM  There were no vitals filed for this visit.   There is no height or weight on file to  calculate BMI.  Generalized: Well developed, in no acute distress  Neurological examination  Mentation: Alert oriented to time, place, history taking. Follows all commands speech and language fluent Cranial nerve II-XII: Pupils were equal round reactive to light. Extraocular movements were full, visual field were full on confrontational test. Facial sensation and strength were normal. Head turning and shoulder shrug  were normal and symmetric. Motor: The motor testing reveals 5 over 5 strength of all 4 extremities. Good symmetric motor tone is noted throughout.  Sensory: Sensory testing is intact to soft touch on all 4 extremities. No evidence of extinction  is noted.  Coordination: Cerebellar testing reveals good finger-nose-finger and heel-to-shin bilaterally.  Gait and station: Gait is normal.  Reflexes: Deep tendon reflexes are symmetric and normal bilaterally.   DIAGNOSTIC DATA (LABS, IMAGING, TESTING) - I reviewed patient records, labs, notes, testing and imaging myself where available.  Lab Results  Component Value Date   WBC 4.9 05/20/2023   HGB 14.3 05/20/2023   HCT 42.7 05/20/2023   MCV 94.7 05/20/2023   PLT 234 05/20/2023      Component Value Date/Time   NA 139 05/20/2023 1108   NA 142 04/01/2022 0924   K 4.1 05/20/2023 1108   CL 101 05/20/2023 1108   CO2 31 05/20/2023 1108   GLUCOSE 77 05/20/2023 1108   BUN 12 05/20/2023 1108   BUN 11 04/01/2022 0924   CREATININE 0.58 05/20/2023 1108   CREATININE 0.63 04/13/2018 0741   CALCIUM 9.3 07/16/2023 1225   PROT 6.3 11/29/2023 1051   ALBUMIN 4.3 11/29/2023 1051   AST 24 11/29/2023 1051   ALT 19 11/29/2023 1051   ALKPHOS 70 11/29/2023 1051   BILITOT 0.5 11/29/2023 1051   GFRNONAA >60 05/20/2023 1108   GFRAA >60 11/04/2017 1311   Lab Results  Component Value Date   CHOL 140 11/29/2023   HDL 70 11/29/2023   LDLCALC 57 11/29/2023   TRIG 62 11/29/2023   CHOLHDL 2.0 11/29/2023   No results found for: "HGBA1C" No results found for: "VITAMINB12" Lab Results  Component Value Date   TSH 2.80 09/10/2022    Margie Ege, AGNP-C, DNP 12/20/2023, 9:18 PM [facility] [address] [place], [place] [address] [phone]

## 2023-12-21 ENCOUNTER — Encounter: Payer: Self-pay | Admitting: Neurology

## 2023-12-21 ENCOUNTER — Ambulatory Visit: Payer: Medicare Other | Admitting: Neurology

## 2023-12-21 VITALS — BP 125/75 | HR 70 | Ht 66.0 in | Wt 208.0 lb

## 2023-12-21 DIAGNOSIS — G40209 Localization-related (focal) (partial) symptomatic epilepsy and epileptic syndromes with complex partial seizures, not intractable, without status epilepticus: Secondary | ICD-10-CM | POA: Diagnosis not present

## 2023-12-21 NOTE — Patient Instructions (Signed)
 Great to see you today.  We will check your Lamictal level.  Please continue Lamictal 200 mg twice daily.  Please call for seizures or other issue.  See you in 1 year.  Thanks!!

## 2023-12-22 ENCOUNTER — Encounter: Payer: Self-pay | Admitting: Neurology

## 2023-12-22 LAB — LAMOTRIGINE LEVEL: Lamotrigine Lvl: 6.9 ug/mL (ref 2.0–20.0)

## 2023-12-22 LAB — TSH: TSH: 3.3 u[IU]/mL (ref 0.450–4.500)

## 2023-12-23 ENCOUNTER — Ambulatory Visit (HOSPITAL_BASED_OUTPATIENT_CLINIC_OR_DEPARTMENT_OTHER): Payer: Medicare Other

## 2023-12-23 DIAGNOSIS — R0989 Other specified symptoms and signs involving the circulatory and respiratory systems: Secondary | ICD-10-CM | POA: Diagnosis not present

## 2023-12-23 DIAGNOSIS — I2510 Atherosclerotic heart disease of native coronary artery without angina pectoris: Secondary | ICD-10-CM

## 2023-12-23 DIAGNOSIS — E785 Hyperlipidemia, unspecified: Secondary | ICD-10-CM

## 2023-12-27 ENCOUNTER — Encounter (HOSPITAL_BASED_OUTPATIENT_CLINIC_OR_DEPARTMENT_OTHER): Payer: Self-pay

## 2023-12-27 ENCOUNTER — Other Ambulatory Visit: Payer: Self-pay | Admitting: *Deleted

## 2023-12-27 DIAGNOSIS — R221 Localized swelling, mass and lump, neck: Secondary | ICD-10-CM

## 2023-12-31 ENCOUNTER — Encounter: Payer: Self-pay | Admitting: Gastroenterology

## 2024-01-05 ENCOUNTER — Other Ambulatory Visit (HOSPITAL_BASED_OUTPATIENT_CLINIC_OR_DEPARTMENT_OTHER): Payer: Self-pay

## 2024-01-05 MED ORDER — ERYTHROMYCIN 5 MG/GM OP OINT
TOPICAL_OINTMENT | OPHTHALMIC | 3 refills | Status: DC
  Filled 2024-01-05: qty 3.5, 7d supply, fill #0

## 2024-01-10 ENCOUNTER — Ambulatory Visit (HOSPITAL_BASED_OUTPATIENT_CLINIC_OR_DEPARTMENT_OTHER)
Admission: RE | Admit: 2024-01-10 | Discharge: 2024-01-10 | Disposition: A | Discharge status: Home / Self Care | Source: Ambulatory Visit | Attending: Nurse Practitioner | Admitting: Nurse Practitioner

## 2024-01-10 ENCOUNTER — Other Ambulatory Visit (HOSPITAL_BASED_OUTPATIENT_CLINIC_OR_DEPARTMENT_OTHER): Payer: Self-pay

## 2024-01-10 DIAGNOSIS — R221 Localized swelling, mass and lump, neck: Secondary | ICD-10-CM | POA: Insufficient documentation

## 2024-01-12 ENCOUNTER — Encounter: Payer: Self-pay | Admitting: Sports Medicine

## 2024-01-12 ENCOUNTER — Encounter (HOSPITAL_BASED_OUTPATIENT_CLINIC_OR_DEPARTMENT_OTHER): Payer: Self-pay | Admitting: Physical Therapy

## 2024-01-13 ENCOUNTER — Encounter (HOSPITAL_BASED_OUTPATIENT_CLINIC_OR_DEPARTMENT_OTHER): Payer: Self-pay | Admitting: Physical Therapy

## 2024-01-13 ENCOUNTER — Ambulatory Visit: Payer: Medicare Other

## 2024-01-13 NOTE — Telephone Encounter (Signed)
 Called and left VM with Briana's note of her needing to contact PT herself

## 2024-01-25 ENCOUNTER — Ambulatory Visit (INDEPENDENT_AMBULATORY_CARE_PROVIDER_SITE_OTHER)

## 2024-01-25 ENCOUNTER — Other Ambulatory Visit (HOSPITAL_BASED_OUTPATIENT_CLINIC_OR_DEPARTMENT_OTHER): Payer: Self-pay

## 2024-01-25 VITALS — BP 132/78 | HR 76 | Temp 97.7°F | Ht 66.0 in | Wt 210.0 lb

## 2024-01-25 DIAGNOSIS — Z0000 Encounter for general adult medical examination without abnormal findings: Secondary | ICD-10-CM | POA: Diagnosis not present

## 2024-01-25 NOTE — Progress Notes (Addendum)
 Subjective:   Summer [name] is a 72 y.o. who presents for a Medicare Wellness preventive visit.  Visit Complete: In person  VideoDeclined- This patient declined Interactive audio and Acupuncturist. Therefore the visit was completed with audio only.  Persons Participating in Visit: Patient.  AWV Questionnaire: No: Patient Medicare AWV questionnaire was not completed prior to this visit.  Cardiac Risk Factors include: advanced age (>26men, >34 women);obesity (BMI >30kg/m2);dyslipidemia     Objective:    Today's Vitals   01/25/24 0922  BP: 132/78  Pulse: 76  Temp: 97.7 F (36.5 C)  SpO2: 97%  Weight: 210 lb (95.3 kg)  Height: 5\' 6"  (1.676 m)   Body mass index is 33.89 kg/m.     01/25/2024    9:29 AM 12/15/2023    8:06 AM 05/20/2023   11:05 AM 08/13/2022    2:07 PM 07/28/2021    8:22 AM 09/14/2019   10:16 AM 09/08/2018    3:30 PM  Advanced Directives  Does Patient Have a Medical Advance Directive? Yes Yes Yes Yes Yes Yes Yes  Type of Estate agent of [place];Living will Healthcare Power of [place];Living will Healthcare Power of [place];Living will;Out of facility DNR (pink MOST or yellow form) Healthcare Power of [place];Living will Healthcare Power of [place];Living will Living will;Healthcare Power of Attorney   Does patient want to make changes to medical advance directive?      No - Patient declined   Copy of Healthcare Power of Attorney in Chart? No - copy requested   No - copy requested No - copy requested No - copy requested     Current Medications (verified) Outpatient Encounter Medications as of 01/25/2024  Medication Sig   Ascorbic Acid (VITAMIN C) 100 MG tablet Take 100 mg by mouth daily.   Calcium-Vitamin D-Vitamin K [id number] MG-UNT-MCG TABS Take 1 tablet by mouth 2 (two) times daily. Also includes Vitamin C   Cholecalciferol 100 MCG (4000 UT) CAPS Take 4,000 Units by mouth daily.   Cyanocobalamin (VITAMIN B 12 PO)  Take 1 tablet by mouth daily.   denosumab (PROLIA) 60 MG/ML SOLN injection Inject 60 mg into the skin every 6 (six) months. Administer in upper arm, thigh, or abdomen   EPINEPHrine 0.3 mg/0.3 mL IJ SOAJ injection Inject 0.3 mLs (0.3 mg total) into the muscle as needed for anaphylaxis.   erythromycin ophthalmic ointment Apply small amount to surgical site three times per day   Ferrous Sulfate (IRON SUPPLEMENT PO) Take by mouth.   lamoTRIgine (LAMICTAL) 200 MG tablet Take 1 tablet (200 mg total) by mouth 2 (two) times daily.   omega-3 acid ethyl esters (LOVAZA) 1 g capsule Take 1 g by mouth daily.   rosuvastatin (CRESTOR) 20 MG tablet TAKE 1 TABLET BY MOUTH DAILY   Turmeric (QC TUMERIC COMPLEX PO) Take 1 tablet by mouth daily.   UNABLE TO FIND Take 1 tablet by mouth 2 (two) times daily. Calcium magnesium intensive care   vitamin E 600 UNIT capsule Take 600 Units by mouth daily.   aspirin EC 81 MG tablet Take 81 mg by mouth daily. Swallow whole. (Patient not taking: Reported on 01/25/2024)   [DISCONTINUED] FEROSUL 325 (65 Fe) MG tablet TAKE ONE TABLET BY MOUTH DAILY WITH VITAMIN C. (Patient not taking: Reported on 12/21/2023)   [DISCONTINUED] meloxicam (MOBIC) 15 MG tablet Take 1 tablet (15 mg total) by mouth daily.   [DISCONTINUED] Semaglutide-Weight Management (WEGOVY) 0.25 MG/0.5ML SOAJ Inject 0.25 mg into the skin once  a week. (Patient not taking: Reported on 12/21/2023)   No facility-administered encounter medications on file as of 01/25/2024.    Allergies (verified) Bee venom   History: Past Medical History:  Diagnosis Date   Adenomatous polyp of sigmoid colon 03/18/2017   Overview:  Added automatically from request for surgery [id number]   Agatston coronary artery calcium score greater than 400 01/28/2022   Coronary Calcium Score 896 (LAD 426, LCx 299, RCA 171).  96th percentile.   Alcoholism (HCC)    01/18/18 - no use in 5 years   Asymptomatic varicose veins 01/11/2017   Cancer (HCC)     skin ca   Iron deficiency anemia 01/12/2017   Last Assessment & Plan:  Given script for ferrous sulfate and order for repeat cbc with diff and iron studies to be done in 1 month Will also refer pt back to [facility] for repeat colonoscopy and to dietician for nutritional counseling, per pt request   Lentigines 01/11/2017   Melanocytic nevi of left lower limb, including hip 01/11/2017   Melanocytic nevi of left upper limb, including shoulder 01/11/2017   Melanocytic nevi of right lower limb, including hip 01/11/2017   Melanocytic nevi of right upper limb, including shoulder 01/11/2017   Melanocytic nevi of trunk 01/11/2017   Obesity (BMI 30-39.9) 05/21/2017   Osteoporosis    Plantar fasciitis    Primary osteoarthritis, right shoulder 12/11/2016   Seizure (HCC)    LAST SEIZURE- 2016 per pt   Sleep apnea    no cpap per pt   Statins contraindicated 10/2022   Tolerating rosuvastatin.   Unilateral primary osteoarthritis, left knee 12/11/2016   Past Surgical History:  Procedure Laterality Date   BARIATRIC SURGERY     COLONOSCOPY  03/18/2017   in [place].   ECTOPIC PREGNANCY SURGERY     NM MYOVIEW LTD  10/2017   Normal EF.  LOW RISK.  No ischemia or infarction.  No RWMA.   POLYPECTOMY     SIGMOIDOSCOPY     in [place].   TOTAL KNEE ARTHROPLASTY Left 09/07/2017   Procedure: LEFT TOTAL KNEE ARTHROPLASTY;  Surgeon: Durene Romans, MD;  Location: [facility];  Service: Orthopedics;  Laterality: Left;  Adductor Block   Family History  Problem Relation Age of Onset   Mitral valve prolapse Mother    Osteoporosis Mother    Memory loss Mother    Colon cancer Father 63   Alcoholism Father    Colon polyps Neg Hx    Esophageal cancer Neg Hx    Rectal cancer Neg Hx    Stomach cancer Neg Hx    Social History   Socioeconomic History   Marital status: Married    Spouse name: Not on file   Number of children: 1   Years of education: college   Highest education level: Bachelor's degree (e.g., BA, AB, BS)   Occupational History   Occupation: Retired  Tobacco Use   Smoking status: Never   Smokeless tobacco: Never  Vaping Use   Vaping status: Never Used  Substance and Sexual Activity   Alcohol use: No   Drug use: No   Sexual activity: Never    Birth control/protection: Post-menopausal  Other Topics Concern   Not on file  Social History Narrative   Lives at home with husband.   Right-handed.   3 cups caffeine per day.   Cares for 2 grandchildren 3 days per week    Social Drivers of Corporate investment banker Strain: Low Risk  (  01/25/2024)   Overall Financial Resource Strain (CARDIA)    Difficulty of Paying Living Expenses: Not hard at all  Food Insecurity: No Food Insecurity (01/25/2024)   Hunger Vital Sign    Worried About Running Out of Food in the Last Year: Never true    Ran Out of Food in the Last Year: Never true  Transportation Needs: No Transportation Needs (01/25/2024)   PRAPARE - Administrator, Civil Service (Medical): No    Lack of Transportation (Non-Medical): No  Physical Activity: Sufficiently Active (01/25/2024)   Exercise Vital Sign    Days of Exercise per Week: 3 days    Minutes of Exercise per Session: 50 min  Stress: No Stress Concern Present (01/25/2024)   [employer] of Occupational Health - Occupational Stress Questionnaire    Feeling of Stress : Not at all  Social Connections: Unknown (01/25/2024)   Social Connection and Isolation Panel [NHANES]    Frequency of Communication with Friends and Family: More than three times a week    Frequency of Social Gatherings with Friends and Family: More than three times a week    Attends Religious Services: Patient declined    Database administrator or Organizations: Yes    Attends Banker Meetings: 1 to 4 times per year    Marital Status: Married    Tobacco Counseling Counseling given: Not Answered    Clinical Intake:  Pre-visit preparation completed: Yes  Pain : No/denies pain      BMI - recorded: 33.89 Nutritional Status: BMI > 30  Obese Nutritional Risks: None Diabetes: No  No results found for: "HGBA1C"   How often do you need to have someone help you when you read instructions, pamphlets, or other written materials from your doctor or pharmacy?: 1 - Never  Interpreter Needed?: No  Information entered by :: Lanier Ensign, LPN   Activities of Daily Living     01/25/2024    9:24 AM  In your present state of health, do you have any difficulty performing the following activities:  Hearing? 0  Vision? 0  Difficulty concentrating or making decisions? 0  Walking or climbing stairs? 0  Dressing or bathing? 0  Doing errands, shopping? 0  Preparing Food and eating ? N  Using the Toilet? N  In the past six months, have you accidently leaked urine? N  Do you have problems with loss of bowel control? N  Managing your Medications? N  Managing your Finances? N  Housekeeping or managing your Housekeeping? N    Patient Care Team: Allwardt, Crist Infante, PA-C as PCP - General (Physician Assistant) Marykay Lex, MD as PCP - Cardiology (Cardiology) [facility] as Consulting Physician (Dermatology) Sallye Lat, MD as Consulting Physician (Ophthalmology) [facility] (Specialist)  Indicate any recent Medical Services you may have received from other than [facility] providers in the past year (date may be approximate).     Assessment:   This is a routine wellness examination for [facility].  Hearing/Vision screen Hearing Screening - Comments:: Pt denies any hearing issues  Vision Screening - Comments:: Pt follows up with Dr  Dione Booze for annual eye exams    Goals Addressed             This Visit's Progress    Patient Stated       Maintain health and activity        Depression Screen     01/25/2024    9:28 AM 10/26/2023  8:19 AM 10/26/2023    8:18 AM 08/02/2023    4:05 PM 08/13/2022    2:08 PM 08/11/2022    1:37 PM 07/28/2021    8:18  AM  PHQ 2/9 Scores  PHQ - 2 Score 0 1 1 0 0 0 1  PHQ- 9 Score  3         Fall Risk     01/25/2024    9:30 AM 10/26/2023    8:18 AM 08/02/2023    4:05 PM 08/13/2022    2:11 PM 07/10/2022    1:32 PM  Fall Risk   Falls in the past year? 0 0 0 0 0  Number falls in past yr: 0 0 0 0 0  Injury with Fall? 0 0 0 0 0  Risk for fall due to : No Fall Risks No Fall Risks No Fall Risks No Fall Risks;Impaired vision No Fall Risks  Follow up Falls prevention discussed Falls evaluation completed Falls evaluation completed Falls prevention discussed Falls evaluation completed    MEDICARE RISK AT HOME:  Medicare Risk at Home Any stairs in or around the home?: No If so, are there any without handrails?: No Home free of loose throw rugs in walkways, pet beds, electrical cords, etc?: Yes Adequate lighting in your home to reduce risk of falls?: Yes Life alert?: No Use of a cane, walker or w/c?: No Grab bars in the bathroom?: Yes Shower chair or bench in shower?: No Elevated toilet seat or a handicapped toilet?: No  TIMED UP AND GO:  Was the test performed?  Yes  Length of time to ambulate 10 feet: 10 sec Gait steady and fast without use of assistive device  Cognitive Function: 6CIT completed    09/08/2018    3:33 PM  MMSE - Mini Mental State Exam  Not completed: --        01/25/2024    9:31 AM 07/28/2021    8:25 AM  6CIT Screen  What Year? 0 points 0 points  What month? 0 points 0 points  What time? 0 points 0 points  Count back from 20 0 points 0 points  Months in reverse 2 points 0 points  Repeat phrase 4 points 0 points  Total Score 6 points 0 points    Immunizations Immunization History  Administered Date(s) Administered   Fluad Quad(high Dose 65+) 07/21/2019, 09/10/2022   Fluad Trivalent(High Dose 65+) 06/25/2023   H1N1 09/12/2008   Influenza Split 06/28/2011, 10/29/2012   Influenza, High Dose Seasonal PF 01/01/2015, 09/07/2016, 08/02/2018, 07/26/2020   Influenza,  Quadrivalent, Recombinant, Inj, Pf 01/01/2015, 09/07/2016   Influenza,inj,Quad PF,6+ Mos 01/01/2015, 09/07/2016   Influenza,trivalent, recombinat, inj, PF 06/28/2011, 10/29/2012   Influenza-Unspecified 01/01/2015, 09/07/2016, 08/30/2017   PFIZER Comirnaty(Gray Top)Covid-19 Tri-Sucrose Vaccine 01/29/2021   PFIZER(Purple Top)SARS-COV-2 Vaccination 12/04/2019, 12/29/2019, 07/31/2020, 01/29/2021   Pfizer Covid-19 Vaccine Bivalent Booster 73yrs & up 09/15/2021   Pfizer(Comirnaty)Fall Seasonal Vaccine 12 years and older 09/04/2022, 07/06/2023   Pneumococcal Conjugate-13 10/01/2016, 08/17/2018   Pneumococcal Polysaccharide-23 12/20/2013, 09/14/2019, 05/17/2021   Respiratory Syncytial Virus Vaccine,Recomb Aduvanted(Arexvy) 07/28/2022   Tdap 01/27/2012, 09/08/2016, 08/17/2018    Screening Tests Health Maintenance  Topic Date Due   Zoster Vaccines- Shingrix (1 of 2) Never done   COVID-19 Vaccine (9 - Pfizer risk 2024-25 season) 01/03/2024   INFLUENZA VACCINE  05/26/2024   DEXA SCAN  12/13/2024   Medicare Annual Wellness (AWV)  01/24/2025   MAMMOGRAM  11/29/2025   Colonoscopy  12/09/2025   DTaP/Tdap/Td (4 -  Td or Tdap) 08/17/2028   Pneumonia Vaccine 71+ Years old  Completed   Hepatitis C Screening  Completed   HPV VACCINES  Aged Out    Health Maintenance  Health Maintenance Due  Topic Date Due   Zoster Vaccines- Shingrix (1 of 2) Never done   COVID-19 Vaccine (9 - Pfizer risk 2024-25 season) 01/03/2024   Health Maintenance Items Addressed: See Nurse Notes  Additional Screening:  Vision Screening: Recommended annual ophthalmology exams for early detection of glaucoma and other disorders of the eye.  Dental Screening: Recommended annual dental exams for proper oral hygiene  Community Resource Referral / Chronic Care Management: CRR required this visit?  No   CCM required this visit?  No     Plan:     I have personally reviewed and noted the following in the patient's chart:    Medical and social history Use of alcohol, tobacco or illicit drugs  Current medications and supplements including opioid prescriptions. Patient is not currently taking opioid prescriptions. Functional ability and status Nutritional status Physical activity Advanced directives List of other physicians Hospitalizations, surgeries, and ER visits in previous 12 months Vitals Screenings to include cognitive, depression, and falls Referrals and appointments  In addition, I have reviewed and discussed with patient certain preventive protocols, quality metrics, and best practice recommendations. A written personalized care plan for preventive services as well as general preventive health recommendations were provided to patient.     Marzella Schlein, LPN   11/26/3084   After Visit Summary: (MyChart) Due to this being a telephonic visit, the after visit summary with patients personalized plan was offered to patient via MyChart   Notes:  Pt is concerned with eye lids and stated she has been denied surgery and feels she needs it. Pt wants you to be aware

## 2024-01-25 NOTE — Patient Instructions (Signed)
 Ms. Basurto , Thank you for taking time to come for your Medicare Wellness Visit. I appreciate your ongoing commitment to your health goals. Please review the following plan we discussed and let me know if I can assist you in the future.   Referrals/Orders/Follow-Ups/Clinician Recommendations: Aim for 30 minutes of exercise or brisk walking, 6-8 glasses of water, and 5 servings of fruits and vegetables each day.   This is a list of the screening recommended for you and due dates:  Health Maintenance  Topic Date Due   Zoster (Shingles) Vaccine (1 of 2) Never done   Medicare Annual Wellness Visit  08/14/2023   COVID-19 Vaccine (9 - Pfizer risk 2024-25 season) 01/03/2024   Flu Shot  05/26/2024   DEXA scan (bone density measurement)  12/13/2024   Mammogram  11/29/2025   Colon Cancer Screening  12/09/2025   DTaP/Tdap/Td vaccine (4 - Td or Tdap) 08/17/2028   Pneumonia Vaccine  Completed   Hepatitis C Screening  Completed   HPV Vaccine  Aged Out    Advanced directives: (Copy Requested) Please bring a copy of your health care power of attorney and living will to the office to be added to your chart at your convenience. You can mail to [facility] [address] [place], [place] [address] or email to [email or web address]  Next Medicare Annual Wellness Visit scheduled for next year: Yes

## 2024-01-27 NOTE — Progress Notes (Signed)
 Subjective:   Summer [name] is a 73 y.o. who presents for a Medicare Wellness preventive visit.  Visit Complete: In person  Persons Participating in Visit: Patient.  AWV Questionnaire: No: Patient Medicare AWV questionnaire was not completed prior to this visit.  Cardiac Risk Factors include: advanced age (>23men, >3 women);obesity (BMI >30kg/m2);dyslipidemia     Objective:    Today's Vitals   01/25/24 0922  BP: 132/78  Pulse: 76  Temp: 97.7 F (36.5 C)  SpO2: 97%  Weight: 210 lb (95.3 kg)  Height: 5\' 6"  (1.676 m)   Body mass index is 33.89 kg/m.     01/25/2024    9:29 AM 12/15/2023    8:06 AM 05/20/2023   11:05 AM 08/13/2022    2:07 PM 07/28/2021    8:22 AM 09/14/2019   10:16 AM 09/08/2018    3:30 PM  Advanced Directives  Does Patient Have a Medical Advance Directive? Yes Yes Yes Yes Yes Yes Yes  Type of Estate agent of [place];Living will Healthcare Power of [place];Living will Healthcare Power of [place];Living will;Out of facility DNR (pink MOST or yellow form) Healthcare Power of [place];Living will Healthcare Power of [place];Living will Living will;Healthcare Power of Attorney   Does patient want to make changes to medical advance directive?      No - Patient declined   Copy of Healthcare Power of Attorney in Chart? No - copy requested   No - copy requested No - copy requested No - copy requested     Current Medications (verified) Outpatient Encounter Medications as of 01/25/2024  Medication Sig   Ascorbic Acid (VITAMIN C) 100 MG tablet Take 100 mg by mouth daily.   Calcium-Vitamin D-Vitamin K [phone] MG-UNT-MCG TABS Take 1 tablet by mouth 2 (two) times daily. Also includes Vitamin C   Cholecalciferol 100 MCG (4000 UT) CAPS Take 4,000 Units by mouth daily.   Cyanocobalamin (VITAMIN B 12 PO) Take 1 tablet by mouth daily.   denosumab (PROLIA) 60 MG/ML SOLN injection Inject 60 mg into the skin every 6 (six) months. Administer in  upper arm, thigh, or abdomen   EPINEPHrine 0.3 mg/0.3 mL IJ SOAJ injection Inject 0.3 mLs (0.3 mg total) into the muscle as needed for anaphylaxis.   erythromycin ophthalmic ointment Apply small amount to surgical site three times per day   Ferrous Sulfate (IRON SUPPLEMENT PO) Take by mouth.   lamoTRIgine (LAMICTAL) 200 MG tablet Take 1 tablet (200 mg total) by mouth 2 (two) times daily.   omega-3 acid ethyl esters (LOVAZA) 1 g capsule Take 1 g by mouth daily.   rosuvastatin (CRESTOR) 20 MG tablet TAKE 1 TABLET BY MOUTH DAILY   Turmeric (QC TUMERIC COMPLEX PO) Take 1 tablet by mouth daily.   UNABLE TO FIND Take 1 tablet by mouth 2 (two) times daily. Calcium magnesium intensive care   vitamin E 600 UNIT capsule Take 600 Units by mouth daily.   aspirin EC 81 MG tablet Take 81 mg by mouth daily. Swallow whole. (Patient not taking: Reported on 01/25/2024)   [DISCONTINUED] FEROSUL 325 (65 Fe) MG tablet TAKE ONE TABLET BY MOUTH DAILY WITH VITAMIN C. (Patient not taking: Reported on 12/21/2023)   [DISCONTINUED] meloxicam (MOBIC) 15 MG tablet Take 1 tablet (15 mg total) by mouth daily.   [DISCONTINUED] Semaglutide-Weight Management (WEGOVY) 0.25 MG/0.5ML SOAJ Inject 0.25 mg into the skin once a week. (Patient not taking: Reported on 12/21/2023)   No facility-administered encounter medications on file as of  01/25/2024.    Allergies (verified) Bee venom   History: Past Medical History:  Diagnosis Date   Adenomatous polyp of sigmoid colon 03/18/2017   Overview:  Added automatically from request for surgery [id number]   Agatston coronary artery calcium score greater than 400 01/28/2022   Coronary Calcium Score 896 (LAD 426, LCx 299, RCA 171).  96th percentile.   Alcoholism (HCC)    01/18/18 - no use in 5 years   Asymptomatic varicose veins 01/11/2017   Cancer (HCC)    skin ca   Iron deficiency anemia 01/12/2017   Last Assessment & Plan:  Given script for ferrous sulfate and order for repeat cbc with diff  and iron studies to be done in 1 month Will also refer pt back to [facility] for repeat colonoscopy and to dietician for nutritional counseling, per pt request   Lentigines 01/11/2017   Melanocytic nevi of left lower limb, including hip 01/11/2017   Melanocytic nevi of left upper limb, including shoulder 01/11/2017   Melanocytic nevi of right lower limb, including hip 01/11/2017   Melanocytic nevi of right upper limb, including shoulder 01/11/2017   Melanocytic nevi of trunk 01/11/2017   Obesity (BMI 30-39.9) 05/21/2017   Osteoporosis    Plantar fasciitis    Primary osteoarthritis, right shoulder 12/11/2016   Seizure (HCC)    LAST SEIZURE- 2016 per pt   Sleep apnea    no cpap per pt   Statins contraindicated 10/2022   Tolerating rosuvastatin.   Unilateral primary osteoarthritis, left knee 12/11/2016   Past Surgical History:  Procedure Laterality Date   BARIATRIC SURGERY     COLONOSCOPY  03/18/2017   in [place].   ECTOPIC PREGNANCY SURGERY     NM MYOVIEW LTD  10/2017   Normal EF.  LOW RISK.  No ischemia or infarction.  No RWMA.   POLYPECTOMY     SIGMOIDOSCOPY     in [place].   TOTAL KNEE ARTHROPLASTY Left 09/07/2017   Procedure: LEFT TOTAL KNEE ARTHROPLASTY;  Surgeon: Durene Romans, MD;  Location: [facility];  Service: Orthopedics;  Laterality: Left;  Adductor Block   Family History  Problem Relation Age of Onset   Mitral valve prolapse Mother    Osteoporosis Mother    Memory loss Mother    Colon cancer Father 37   Alcoholism Father    Colon polyps Neg Hx    Esophageal cancer Neg Hx    Rectal cancer Neg Hx    Stomach cancer Neg Hx    Social History   Socioeconomic History   Marital status: Married    Spouse name: Not on file   Number of children: 1   Years of education: college   Highest education level: Bachelor's degree (e.g., BA, AB, BS)  Occupational History   Occupation: Retired  Tobacco Use   Smoking status: Never   Smokeless tobacco: Never  Vaping Use   Vaping status:  Never Used  Substance and Sexual Activity   Alcohol use: No   Drug use: No   Sexual activity: Never    Birth control/protection: Post-menopausal  Other Topics Concern   Not on file  Social History Narrative   Lives at home with husband.   Right-handed.   3 cups caffeine per day.   Cares for 2 grandchildren 3 days per week    Social Drivers of Health   Financial Resource Strain: Low Risk  (01/25/2024)   Overall Financial Resource Strain (CARDIA)    Difficulty of Paying Living Expenses: Not  hard at all  Food Insecurity: No Food Insecurity (01/25/2024)   Hunger Vital Sign    Worried About Running Out of Food in the Last Year: Never true    Ran Out of Food in the Last Year: Never true  Transportation Needs: No Transportation Needs (01/25/2024)   PRAPARE - Administrator, Civil Service (Medical): No    Lack of Transportation (Non-Medical): No  Physical Activity: Sufficiently Active (01/25/2024)   Exercise Vital Sign    Days of Exercise per Week: 3 days    Minutes of Exercise per Session: 50 min  Stress: No Stress Concern Present (01/25/2024)   [employer] of Occupational Health - Occupational Stress Questionnaire    Feeling of Stress : Not at all  Social Connections: Unknown (01/25/2024)   Social Connection and Isolation Panel [NHANES]    Frequency of Communication with Friends and Family: More than three times a week    Frequency of Social Gatherings with Friends and Family: More than three times a week    Attends Religious Services: Patient declined    Database administrator or Organizations: Yes    Attends Banker Meetings: 1 to 4 times per year    Marital Status: Married    Tobacco Counseling Counseling given: Not Answered    Clinical Intake:  Pre-visit preparation completed: Yes  Pain : No/denies pain     BMI - recorded: 33.89 Nutritional Status: BMI > 30  Obese Nutritional Risks: None Diabetes: No  No results found for: "HGBA1C"   How  often do you need to have someone help you when you read instructions, pamphlets, or other written materials from your doctor or pharmacy?: 1 - Never  Interpreter Needed?: No  Information entered by :: Lanier Ensign, LPN   Activities of Daily Living     01/25/2024    9:24 AM  In your present state of health, do you have any difficulty performing the following activities:  Hearing? 0  Vision? 0  Difficulty concentrating or making decisions? 0  Walking or climbing stairs? 0  Dressing or bathing? 0  Doing errands, shopping? 0  Preparing Food and eating ? N  Using the Toilet? N  In the past six months, have you accidently leaked urine? N  Do you have problems with loss of bowel control? N  Managing your Medications? N  Managing your Finances? N  Housekeeping or managing your Housekeeping? N    Patient Care Team: Allwardt, Crist Infante, PA-C as PCP - General (Physician Assistant) Marykay Lex, MD as PCP - Cardiology (Cardiology) [facility] as Consulting Physician (Dermatology) Sallye Lat, MD as Consulting Physician (Ophthalmology) [facility] (Specialist)  Indicate any recent Medical Services you may have received from other than [facility] providers in the past year (date may be approximate).     Assessment:   This is a routine wellness examination for [facility].  Hearing/Vision screen Hearing Screening - Comments:: Pt denies any hearing issues  Vision Screening - Comments:: Pt follows up with Dr  Dione Booze for annual eye exams    Goals Addressed             This Visit's Progress    Patient Stated       Maintain health and activity        Depression Screen     01/25/2024    9:28 AM 10/26/2023    8:19 AM 10/26/2023    8:18 AM 08/02/2023    4:05 PM 08/13/2022  2:08 PM 08/11/2022    1:37 PM 07/28/2021    8:18 AM  PHQ 2/9 Scores  PHQ - 2 Score 0 1 1 0 0 0 1  PHQ- 9 Score  3         Fall Risk     01/25/2024    9:30 AM 10/26/2023    8:18 AM  08/02/2023    4:05 PM 08/13/2022    2:11 PM 07/10/2022    1:32 PM  Fall Risk   Falls in the past year? 0 0 0 0 0  Number falls in past yr: 0 0 0 0 0  Injury with Fall? 0 0 0 0 0  Risk for fall due to : No Fall Risks No Fall Risks No Fall Risks No Fall Risks;Impaired vision No Fall Risks  Follow up Falls prevention discussed Falls evaluation completed Falls evaluation completed Falls prevention discussed Falls evaluation completed    MEDICARE RISK AT HOME:  Medicare Risk at Home Any stairs in or around the home?: No If so, are there any without handrails?: No Home free of loose throw rugs in walkways, pet beds, electrical cords, etc?: Yes Adequate lighting in your home to reduce risk of falls?: Yes Life alert?: No Use of a cane, walker or w/c?: No Grab bars in the bathroom?: Yes Shower chair or bench in shower?: No Elevated toilet seat or a handicapped toilet?: No  TIMED UP AND GO:  Was the test performed?  Yes  Length of time to ambulate 10 feet: 10 sec Gait steady and fast without use of assistive device  Cognitive Function: 6CIT completed    09/08/2018    3:33 PM  MMSE - Mini Mental State Exam  Not completed: --        01/25/2024    9:31 AM 07/28/2021    8:25 AM  6CIT Screen  What Year? 0 points 0 points  What month? 0 points 0 points  What time? 0 points 0 points  Count back from 20 0 points 0 points  Months in reverse 2 points 0 points  Repeat phrase 4 points 0 points  Total Score 6 points 0 points    Immunizations Immunization History  Administered Date(s) Administered   Fluad Quad(high Dose 65+) 07/21/2019, 09/10/2022   Fluad Trivalent(High Dose 65+) 06/25/2023   H1N1 09/12/2008   Influenza Split 06/28/2011, 10/29/2012   Influenza, High Dose Seasonal PF 01/01/2015, 09/07/2016, 08/02/2018, 07/26/2020   Influenza, Quadrivalent, Recombinant, Inj, Pf 01/01/2015, 09/07/2016   Influenza,inj,Quad PF,6+ Mos 01/01/2015, 09/07/2016   Influenza,trivalent,  recombinat, inj, PF 06/28/2011, 10/29/2012   Influenza-Unspecified 01/01/2015, 09/07/2016, 08/30/2017   PFIZER Comirnaty(Gray Top)Covid-19 Tri-Sucrose Vaccine 01/29/2021   PFIZER(Purple Top)SARS-COV-2 Vaccination 12/04/2019, 12/29/2019, 07/31/2020, 01/29/2021   Pfizer Covid-19 Vaccine Bivalent Booster 29yrs & up 09/15/2021   Pfizer(Comirnaty)Fall Seasonal Vaccine 12 years and older 09/04/2022, 07/06/2023   Pneumococcal Conjugate-13 10/01/2016, 08/17/2018   Pneumococcal Polysaccharide-23 12/20/2013, 09/14/2019, 05/17/2021   Respiratory Syncytial Virus Vaccine,Recomb Aduvanted(Arexvy) 07/28/2022   Tdap 01/27/2012, 09/08/2016, 08/17/2018    Screening Tests Health Maintenance  Topic Date Due   Zoster Vaccines- Shingrix (1 of 2) Never done   COVID-19 Vaccine (9 - Pfizer risk 2024-25 season) 01/03/2024   INFLUENZA VACCINE  05/26/2024   DEXA SCAN  12/13/2024   Medicare Annual Wellness (AWV)  01/24/2025   MAMMOGRAM  11/29/2025   Colonoscopy  12/09/2025   DTaP/Tdap/Td (4 - Td or Tdap) 08/17/2028   Pneumonia Vaccine 93+ Years old  Completed   Hepatitis C Screening  Completed   HPV VACCINES  Aged Out    Health Maintenance  Health Maintenance Due  Topic Date Due   Zoster Vaccines- Shingrix (1 of 2) Never done   COVID-19 Vaccine (9 - Pfizer risk 2024-25 season) 01/03/2024   Health Maintenance Items Addressed: See Nurse Notes  Additional Screening:  Vision Screening: Recommended annual ophthalmology exams for early detection of glaucoma and other disorders of the eye.  Dental Screening: Recommended annual dental exams for proper oral hygiene  Community Resource Referral / Chronic Care Management: CRR required this visit?  No   CCM required this visit?  No     Plan:     I have personally reviewed and noted the following in the patient's chart:   Medical and social history Use of alcohol, tobacco or illicit drugs  Current medications and supplements including opioid  prescriptions. Patient is not currently taking opioid prescriptions. Functional ability and status Nutritional status Physical activity Advanced directives List of other physicians Hospitalizations, surgeries, and ER visits in previous 12 months Vitals Screenings to include cognitive, depression, and falls Referrals and appointments  In addition, I have reviewed and discussed with patient certain preventive protocols, quality metrics, and best practice recommendations. A written personalized care plan for preventive services as well as general preventive health recommendations were provided to patient.     Marzella Schlein, LPN   10/26/9145   After Visit Summary: (MyChart) Due to this being a telephonic visit, the after visit summary with patients personalized plan was offered to patient via MyChart   Notes:  Pt is concerned with eye lids and stated she has been denied surgery and feels she needs it. Pt wants you to be aware

## 2024-01-28 ENCOUNTER — Encounter: Payer: Self-pay | Admitting: Physician Assistant

## 2024-02-03 ENCOUNTER — Encounter (HOSPITAL_BASED_OUTPATIENT_CLINIC_OR_DEPARTMENT_OTHER): Payer: Self-pay

## 2024-02-08 ENCOUNTER — Ambulatory Visit: Admitting: Physician Assistant

## 2024-02-14 ENCOUNTER — Encounter: Payer: Self-pay | Admitting: Physician Assistant

## 2024-02-14 ENCOUNTER — Ambulatory Visit: Admitting: Physician Assistant

## 2024-02-14 VITALS — BP 127/81 | HR 70 | Temp 97.7°F | Ht 66.0 in | Wt 210.0 lb

## 2024-02-14 DIAGNOSIS — G40209 Localization-related (focal) (partial) symptomatic epilepsy and epileptic syndromes with complex partial seizures, not intractable, without status epilepticus: Secondary | ICD-10-CM | POA: Diagnosis not present

## 2024-02-14 DIAGNOSIS — H02403 Unspecified ptosis of bilateral eyelids: Secondary | ICD-10-CM | POA: Diagnosis not present

## 2024-02-14 DIAGNOSIS — R591 Generalized enlarged lymph nodes: Secondary | ICD-10-CM | POA: Diagnosis not present

## 2024-02-14 DIAGNOSIS — R519 Headache, unspecified: Secondary | ICD-10-CM | POA: Diagnosis not present

## 2024-02-14 NOTE — Progress Notes (Signed)
 Patient ID: Summer [name], female    DOB: [birth year], 73 y.o.   MRN: [id number]   Assessment & Plan:  New onset of headaches  Partial epilepsy with impairment of consciousness (HCC)  Generalized enlarged lymph nodes  Ptosis of both eyelids      Assessment and Plan Assessment & Plan Headaches New onset headaches for 2-3 months, described as throbbing and variable in location. No prior headache history. Occurring most days, relieved by aspirin . She has epilepsy and recently weaned off Keppra , with help of neurology team, & increased Lamictal . Possible relation to medication change, neurologist consultation needed for further evaluation. Consider imaging if headaches persist or worsen. - Message neurologist Jeanmarie Millet regarding new onset headaches and recent medication changes. - Consider imaging  - ER if any red flag symptoms   Epilepsy Epilepsy managed with Lamictal  200 mg twice daily. Recently stopped Keppra  without seizure recurrence. Neurologist consultation planned regarding headaches and medication management. - Continue Lamictal  200 mg twice daily. - Consult with neurologist regarding headaches and medication management.  Enlarged lymph nodes Two enlarged lymph nodes in the left neck identified during carotid duplex ultrasound. Lymph nodes measure 1.9 x 0.8 cm and 1 x 1 cm. No recent illness or infection to explain lymphadenopathy. May be incidental, but follow-up imaging recommended to monitor for changes. NOT palpable on exam today. - Order repeat ultrasound of the left neck in May to assess for changes in lymph node size. (Shared decision making on this).  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 She would benefit from corrective procedure. She has consulted with oculo-plastics twice now, but has been turned away due to insurance cost.   General Health Maintenance Up to date on mammograms, takes baby aspirin  daily  for heart health. Not currently exercising, acknowledges need to return to regular exercise. - Encourage return to regular exercise.  Follow-up Upcoming appointments with GI for colonoscopy discussion and with a specialist for osteoporosis/osteopenia. Neurologist appointment next year unless advised otherwise. - Follow up with GI for colonoscopy discussion. - Follow up with specialist for osteoporosis/osteopenia. - Coordinate with neurologist based on response regarding headaches.      Return in about 6 months (around 08/15/2024) for recheck/follow-up.    Subjective:    Chief Complaint  Patient presents with   Follow-up   OSTEOPENIa   Obesity    HPI Discussed the use of AI scribe software for clinical note transcription with the patient, who gave verbal consent to proceed.  History of Present Illness Summer [name] "Summer [name]" is a 73 year old female with epilepsy who presents with headaches and enlarged lymph nodes.  She has been experiencing headaches for the past two to three months, described as throbbing and occurring on different sides of her head. The headaches occur most days and are relieved by aspirin . There is no prior history of headaches, and they seemed to have begun after a medication change in August when she stopped Keppra  and increased Lamictal  to 200 mg twice daily. She has not informed her neurologist about the headaches.  She mentions having two episodes of dizziness and sweating, one in March and another in April, with no apparent triggers. During these episodes, she felt disoriented and had to lie down to recover.  A carotid duplex ultrasound on February 27th showed minimal plaque and normal blood flow, but an abnormality led to further investigation. A subsequent ultrasound on March 17th revealed two enlarged lymph nodes in the left  neck, measuring 1.9 x 0.8 cm and 1 x 1 cm. She has not experienced any recent illness, swelling under the arms, in the groin,  or stomach, and has not felt the lymph nodes.  She takes 81 mg of aspirin  daily for heart health and uses ibuprofen 200 mg, sometimes twice a day, for headache relief. She has not experienced any kidney problems or contraindications to taking aspirin .  Her past medical history includes epilepsy, and she has been doing well on a single agent, Lamictal , with no recurrent seizures since stopping Keppra . She has a history of medication adjustments in August, which were done gradually.     Past Medical History:  Diagnosis Date   Adenomatous polyp of sigmoid colon 03/18/2017   Overview:  Added automatically from request for surgery [id number]   Agatston coronary artery calcium  score greater than 400 01/28/2022   Coronary Calcium  Score 896 (LAD 426, LCx 299, RCA 171).  96th percentile.   Alcoholism (HCC)    01/18/18 - no use in 5 years   Asymptomatic varicose veins 01/11/2017   Cancer (HCC)    skin ca   Iron  deficiency anemia 01/12/2017   Last Assessment & Plan:  Given script for ferrous sulfate  and order for repeat cbc with diff and iron  studies to be done in 1 month Will also refer pt back to CRS for repeat colonoscopy and to dietician for nutritional counseling, per pt request   Lentigines 01/11/2017   Melanocytic nevi of left lower limb, including hip 01/11/2017   Melanocytic nevi of left upper limb, including shoulder 01/11/2017   Melanocytic nevi of right lower limb, including hip 01/11/2017   Melanocytic nevi of right upper limb, including shoulder 01/11/2017   Melanocytic nevi of trunk 01/11/2017   Obesity (BMI 30-39.9) 05/21/2017   Osteoporosis    Plantar fasciitis    Primary osteoarthritis, right shoulder 12/11/2016   Seizure (HCC)    LAST SEIZURE- 2016 per pt   Sleep apnea    no cpap per pt   Statins contraindicated 10/2022   Tolerating rosuvastatin .   Unilateral primary osteoarthritis, left knee 12/11/2016    Past Surgical History:  Procedure Laterality Date   BARIATRIC  SURGERY     COLONOSCOPY  03/18/2017   in [place].   ECTOPIC PREGNANCY SURGERY     NM MYOVIEW  LTD  10/2017   Normal EF.  LOW RISK.  No ischemia or infarction.  No RWMA.   POLYPECTOMY     SIGMOIDOSCOPY     in [place].   TOTAL KNEE ARTHROPLASTY Left 09/07/2017   Procedure: LEFT TOTAL KNEE ARTHROPLASTY;  Surgeon: Claiborne Crew, MD;  Location: [facility];  Service: Orthopedics;  Laterality: Left;  Adductor Block    Family History  Problem Relation Age of Onset   Mitral valve prolapse Mother    Osteoporosis Mother    Memory loss Mother    Colon cancer Father 3   Alcoholism Father    Colon polyps Neg Hx    Esophageal cancer Neg Hx    Rectal cancer Neg Hx    Stomach cancer Neg Hx     Social History   Tobacco Use   Smoking status: Never   Smokeless tobacco: Never  Vaping Use   Vaping status: Never Used  Substance Use Topics   Alcohol use: No   Drug use: No     Allergies  Allergen Reactions   Bee Venom Swelling    Review of Systems NEGATIVE UNLESS OTHERWISE INDICATED IN HPI  Objective:     BP 127/81   Pulse 70   Temp 97.7 F (36.5 C)   Ht 5\' 6"  (1.676 m)   Wt 210 lb (95.3 kg)   SpO2 98%   BMI 33.89 kg/m   Wt Readings from Last 3 Encounters:  02/14/24 210 lb (95.3 kg)  01/25/24 210 lb (95.3 kg)  12/21/23 208 lb (94.3 kg)    BP Readings from Last 3 Encounters:  02/14/24 127/81  01/25/24 132/78  12/21/23 125/75     Physical Exam Vitals and nursing note reviewed.  Constitutional:      Appearance: Normal appearance. She is normal weight. She is not toxic-appearing.  HENT:     Head: Normocephalic and atraumatic.     Right Ear: External ear normal.     Left Ear: External ear normal.  Eyes:     Extraocular Movements: Extraocular movements intact.     Conjunctiva/sclera: Conjunctivae normal.     Pupils: Pupils are equal, round, and reactive to light.  Cardiovascular:     Rate and Rhythm: Normal rate and regular rhythm.     Pulses: Normal pulses.      Heart sounds: Normal heart sounds.  Pulmonary:     Effort: Pulmonary effort is normal.     Breath sounds: Normal breath sounds.  Musculoskeletal:        General: Normal range of motion.     Cervical back: Normal range of motion and neck supple.  Lymphadenopathy:     Comments: No palpable lymph nodes of the neck noted   Skin:    General: Skin is warm and dry.  Neurological:     General: No focal deficit present.     Mental Status: She is alert and oriented to person, place, and time.     Motor: No weakness.     Gait: Gait normal.  Psychiatric:        Mood and Affect: Mood normal.        Behavior: Behavior normal.             Lonny Eisen M Jacarri Gesner, PA-C

## 2024-02-15 ENCOUNTER — Encounter: Payer: Self-pay | Admitting: Physician Assistant

## 2024-02-15 ENCOUNTER — Encounter: Payer: Self-pay | Admitting: Neurology

## 2024-02-15 NOTE — Telephone Encounter (Signed)
 Please see pt msg and request to change location of order.

## 2024-02-15 NOTE — Addendum Note (Signed)
 Addended by: Undrea Shipes on: 02/15/2024 02:44 PM   Modules accepted: Orders

## 2024-02-16 NOTE — Progress Notes (Unsigned)
 Chief Complaint:discuss colonoscopy, gas and bloat Primary GI Doctor:(previously Dr. Savannah Curlin) Dr. Yvone Herd   HPI:  Patient is a 73 year old female patient with past medical history of osteoporosis, IDA, seizures,who presents to discuss colonoscopy, gas and bloat.  12/09/20 colonoscopy with Dr. Beulah Brunt 5 years Impression:  - Non- bleeding internal hemorrhoids.  - Diverticulosis in the sigmoid colon and in the descending colon.  - One 2 mm polyp in the descending colon, removed with a cold snare. Resected and retrieved.  - One 1 mm polyp at the hepatic flexure, removed with a cold snare. Resected and retrieved.  - One 2 mm polyp in the ascending colon, removed with a cold snare. Resected and retrieved.  - The examination was otherwise normal on direct and retroflexion views. Path: Diagnosis Surgical [P], colon, descending, hepatic flexure and cecum, polyp (3) - TUBULAR ADENOMA (1 FRAGMENTS) - MULTIPLE FRAGMENTS OF BENIGN COLONIC MUCOSA - NO HIGH GRADE DYSPLASIA OR MALIGNANCY IDENTIFIED    Interval History      Patient presents to discuss when next colonoscopy due and I answered all of her questions. Last colon 2/22 with 1 tubular adenoma and family history of colon Vikki Graves her father.     Patient also wanted to discuss recent changes in her bowel habits with increased gas and bloat and abdominal discomfort in the lower abdomen . She states she currently is having bowel movement every other day. She states she does not feel she empties out. She admits to straining. No blood in stool. She will have some intermittent lower abdominal pain. She is experiencing a lot of gas and bloat.  She states she does eat certain types of gas-forming foods such as beans but this is not something out of her normal routine.  No new changes to medications.  No changes recently to diet.  Denies fever, chills, or nausea and vomiting.  Last seizure was 2013-2014.  Patient's family history includes father with  colon CA.  Wt Readings from Last 3 Encounters:  02/17/24 209 lb (94.8 kg)  02/14/24 210 lb (95.3 kg)  01/25/24 210 lb (95.3 kg)    Past Medical History:  Diagnosis Date   Adenomatous polyp of sigmoid colon 03/18/2017   Overview:  Added automatically from request for surgery [id number]   Agatston coronary artery calcium  score greater than 400 01/28/2022   Coronary Calcium  Score 896 (LAD 426, LCx 299, RCA 171).  96th percentile.   Alcoholism (HCC)    01/18/18 - no use in 5 years   Asymptomatic varicose veins 01/11/2017   Cancer (HCC)    skin ca   Iron  deficiency anemia 01/12/2017   Last Assessment & Plan:  Given script for ferrous sulfate  and order for repeat cbc with diff and iron  studies to be done in 1 month Will also refer pt back to [facility] for repeat colonoscopy and to dietician for nutritional counseling, per pt request   Lentigines 01/11/2017   Melanocytic nevi of left lower limb, including hip 01/11/2017   Melanocytic nevi of left upper limb, including shoulder 01/11/2017   Melanocytic nevi of right lower limb, including hip 01/11/2017   Melanocytic nevi of right upper limb, including shoulder 01/11/2017   Melanocytic nevi of trunk 01/11/2017   Obesity (BMI 30-39.9) 05/21/2017   Osteoporosis    Plantar fasciitis    Primary osteoarthritis, right shoulder 12/11/2016   Seizure (HCC)    LAST SEIZURE- 2016 per pt   Sleep apnea    no cpap per pt  Statins contraindicated 10/2022   Tolerating rosuvastatin .   Unilateral primary osteoarthritis, left knee 12/11/2016    Past Surgical History:  Procedure Laterality Date   BARIATRIC SURGERY     COLONOSCOPY  03/18/2017   in [place].   ECTOPIC PREGNANCY SURGERY     NM MYOVIEW  LTD  10/2017   Normal EF.  LOW RISK.  No ischemia or infarction.  No RWMA.   POLYPECTOMY     SIGMOIDOSCOPY     in [place].   TOTAL KNEE ARTHROPLASTY Left 09/07/2017   Procedure: LEFT TOTAL KNEE ARTHROPLASTY;  Surgeon: Claiborne Crew, MD;  Location: [facility];  Service:  Orthopedics;  Laterality: Left;  Adductor Block    Current Outpatient Medications  Medication Sig Dispense Refill   Ascorbic Acid (VITAMIN C) 100 MG tablet Take 100 mg by mouth daily.     aspirin  EC 81 MG tablet Take 81 mg by mouth daily. Swallow whole.     Calcium -Vitamin D -Vitamin K [phone] MG-UNT-MCG TABS Take 1 tablet by mouth 2 (two) times daily. Also includes Vitamin C     Cholecalciferol 100 MCG (4000 UT) CAPS Take 4,000 Units by mouth daily.     Cyanocobalamin (VITAMIN B 12 PO) Take 1 tablet by mouth daily.     denosumab  (PROLIA ) 60 MG/ML SOLN injection Inject 60 mg into the skin every 6 (six) months. Administer in upper arm, thigh, or abdomen     erythromycin  ophthalmic ointment Apply small amount to surgical site three times per day 3.5 g 3   Ferrous Sulfate  (IRON  SUPPLEMENT PO) Take by mouth.     lamoTRIgine  (LAMICTAL ) 200 MG tablet Take 1 tablet (200 mg total) by mouth 2 (two) times daily. 180 tablet 3   omega-3 acid ethyl esters (LOVAZA) 1 g capsule Take 1 g by mouth daily.     rosuvastatin  (CRESTOR ) 20 MG tablet TAKE 1 TABLET BY MOUTH DAILY 90 tablet 3   Turmeric (QC TUMERIC COMPLEX PO) Take 1 tablet by mouth daily.     UNABLE TO FIND Take 1 tablet by mouth 2 (two) times daily. Calcium  magnesium  intensive care     vitamin E 600 UNIT capsule Take 600 Units by mouth daily.     EPINEPHrine  0.3 mg/0.3 mL IJ SOAJ injection Inject 0.3 mLs (0.3 mg total) into the muscle as needed for anaphylaxis. (Patient not taking: Reported on 02/17/2024) 2 each 1   No current facility-administered medications for this visit.    Allergies as of 02/17/2024 - Review Complete 02/17/2024  Allergen Reaction Noted   Bee venom Swelling     Family History  Problem Relation Age of Onset   Mitral valve prolapse Mother    Osteoporosis Mother    Memory loss Mother    Colon cancer Father 45   Alcoholism Father    Colon polyps Neg Hx    Esophageal cancer Neg Hx    Rectal cancer Neg Hx    Stomach  cancer Neg Hx     Review of Systems:    Constitutional: No weight loss, fever, chills, weakness or fatigue HEENT: Eyes: No change in vision               Ears, Nose, Throat:  No change in hearing or congestion Skin: No rash or itching Cardiovascular: No chest pain, chest pressure or palpitations   Respiratory: No SOB or cough Gastrointestinal: See HPI and otherwise negative Genitourinary: No dysuria or change in urinary frequency Neurological: No headache, dizziness or syncope Musculoskeletal: No new muscle  or joint pain Hematologic: No bleeding or bruising Psychiatric: No history of depression or anxiety    Physical Exam:  Vital signs: BP 122/68   Pulse 78   Ht 5\' 6"  (1.676 m)   Wt 209 lb (94.8 kg)   BMI 33.73 kg/m   Constitutional:   Pleasant  female appears to be in NAD, Well developed, Well nourished, alert and cooperative Throat: Oral cavity and pharynx without inflammation, swelling or lesion.  Respiratory: Respirations even and unlabored. Lungs clear to auscultation bilaterally.   No wheezes, crackles, or rhonchi.  Cardiovascular: Normal S1, S2. Regular rate and rhythm. No peripheral edema, cyanosis or pallor.  Gastrointestinal:  Soft, nondistended, lower abdominal tenderness with palpation. No rebound or guarding. Normal bowel sounds. No appreciable masses or hepatomegaly. Rectal:  Not performed.  Msk:  Symmetrical without gross deformities. Without edema, no deformity or joint abnormality.  Neurologic:  Alert and  oriented x4;  grossly normal neurologically.  Skin:   Dry and intact without significant lesions or rashes. Psychiatric: Oriented to person, place and time. Demonstrates good judgement and reason without abnormal affect or behaviors.  RELEVANT LABS AND IMAGING: CBC    Latest Ref Rng & Units 05/20/2023   11:08 AM 01/28/2022    1:06 PM 08/25/2021    8:26 AM  CBC  WBC 4.0 - 10.5 K/uL 4.9  5.4  4.6   Hemoglobin 12.0 - 15.0 g/dL 16.1  09.6  04.5   Hematocrit  36.0 - 46.0 % 42.7  40.8  36.0   Platelets 150 - 400 K/uL 234  235.0  270.0      CMP     Latest Ref Rng & Units 11/29/2023   10:51 AM 07/16/2023   12:25 PM 05/20/2023   11:08 AM  CMP  Glucose 70 - 99 mg/dL   77   BUN 8 - 23 mg/dL   12   Creatinine 4.09 - 1.00 mg/dL   8.11   Sodium 914 - 782 mmol/L   139   Potassium 3.5 - 5.1 mmol/L   4.1   Chloride 98 - 111 mmol/L   101   CO2 22 - 32 mmol/L   31   Calcium  8.6 - 10.4 mg/dL  9.3  9.8   Total Protein 6.0 - 8.5 g/dL 6.3     Total Bilirubin 0.0 - 1.2 mg/dL 0.5     Alkaline Phos 44 - 121 IU/L 70     AST 0 - 40 IU/L 24     ALT 0 - 32 IU/L 19        Lab Results  Component Value Date   TSH 3.300 12/21/2023   01/10/24 ULTRASOUND OF HEAD/NECK SOFT TISSUES  IMPRESSION: Sonographic evaluation of the left neck demonstrates 2 nonenlarged lymph nodes. If there is evidence of growth on physical examination, repeat ultrasound should be performed.  Assessment: Encounter Diagnoses  Name Primary?   Flatulence Yes   Bloating    Altered bowel habits    LLQ pain    History of colonic polyps    73 year old female patient that presents with altered bowel habits, lower abdominal discomfort with gas and bloat.  She does have history of diverticular disease so I will go ahead and order CT scan to rule out diverticulitis.  She can start fiber supplementation p.o. daily and MiraLAX  as needed for constipation. Also provided samples of ib gard and diet on eliminating gas forming foods. She is up to date on her colonoscopy with history of tubular  adenoma's and colon CA in father, however if her CT scan is normal and no improvement in symptoms Oluwatoni Rotunno consider completing colon screening earlier.  Patient agrees to plan.   Plan: - Start Citrucel 1 tsp po daily - Can use Miralax  po daily as needed for constipation  - Order CT ABD/pelvis with and without contrast to r/o diverticulitis -gas forming foods hand out -Samples of Ibgard 2 capsules po daily  -recall  colonoscopy 11/2025   Thank you for the courtesy of this consult. Please call me with any questions or concerns.   Tamas Suen, FNP-C [facility] 02/17/2024, 10:14 AM  Cc: Allwardt, Deleta Felix, PA-C  I have reviewed the clinic note as outlined by Arlon Lamb, NP and agree with the assessment, plan and medical decision making.    Ms. Whitenight presents with symptoms of altered bowel habits, abdominal discomfort, gas and bloating.  Family history of colorectal cancer in a first-degree relative.  Last colonoscopy performed in 2022 showed polyps 1 of which was a subcentimeter tubular adenoma.  Agree with conservative management of constipation which could be contributing to symptoms.  Reasonable to perform cross-sectional imaging with CTAP.  Plan for recall colonoscopy in 2027 unless alarm features evolve.  Eugenia Hess, MD

## 2024-02-17 ENCOUNTER — Encounter: Payer: Self-pay | Admitting: Gastroenterology

## 2024-02-17 ENCOUNTER — Ambulatory Visit: Admitting: Gastroenterology

## 2024-02-17 VITALS — BP 122/68 | HR 78 | Ht 66.0 in | Wt 209.0 lb

## 2024-02-17 DIAGNOSIS — R1032 Left lower quadrant pain: Secondary | ICD-10-CM | POA: Diagnosis not present

## 2024-02-17 DIAGNOSIS — R140 Abdominal distension (gaseous): Secondary | ICD-10-CM | POA: Diagnosis not present

## 2024-02-17 DIAGNOSIS — R194 Change in bowel habit: Secondary | ICD-10-CM

## 2024-02-17 DIAGNOSIS — Z860100 Personal history of colon polyps, unspecified: Secondary | ICD-10-CM

## 2024-02-17 DIAGNOSIS — R143 Flatulence: Secondary | ICD-10-CM | POA: Diagnosis not present

## 2024-02-17 NOTE — Patient Instructions (Addendum)
 Start Citrucel 1 tsp po daily (fiber supplement) Can use OTC Miralax  po daily as needed for constipation  Samples of Ibgard 2 capsules po daily , can get over the counter for gas and bloat Go to the ER if unable to pass gas, severe AB pain, unable to hold down food, any shortness of breath of chest pain.  You have been scheduled for a CT scan of the abdomen and pelvis at [facility], 1st floor Radiology. You are scheduled on 02/24/24 at 2:15. You should arrive 15 minutes prior to your appointment time for registration.   You may take any medications as prescribed with a small amount of water , if necessary.   If you have any questions regarding your exam or if you need to reschedule, you may call [facility] at [phone] between the hours of 8:00 am and 5:00 pm, Monday-Friday.   _______________________________________________________  If your blood pressure at your visit was 140/90 or greater, please contact your primary care physician to follow up on this.  _______________________________________________________  If you are age 73 or older, your body mass index should be between 23-30. Your Body mass index is 33.73 kg/m. If this is out of the aforementioned range listed, please consider follow up with your Primary Care Provider.  If you are age 6 or younger, your body mass index should be between 19-25. Your Body mass index is 33.73 kg/m. If this is out of the aformentioned range listed, please consider follow up with your Primary Care Provider.   ________________________________________________________  The [facility] providers would like to encourage you to use MYCHART to communicate with providers for non-urgent requests or questions.  Due to long hold times on the telephone, sending your provider a message by [facility] may be a faster and more efficient way to get a response.  Please allow 48 business hours for a response.  Please remember that this is for non-urgent  requests.  _______________________________________________________  Thank you for trusting me with your gastrointestinal care!   Dyanna Glasgow, NP

## 2024-02-19 ENCOUNTER — Encounter: Payer: Self-pay | Admitting: Neurology

## 2024-02-21 ENCOUNTER — Encounter: Payer: Self-pay | Admitting: Physician Assistant

## 2024-02-22 ENCOUNTER — Other Ambulatory Visit: Payer: Self-pay | Admitting: Physician Assistant

## 2024-02-22 DIAGNOSIS — R519 Headache, unspecified: Secondary | ICD-10-CM

## 2024-02-22 DIAGNOSIS — G40209 Localization-related (focal) (partial) symptomatic epilepsy and epileptic syndromes with complex partial seizures, not intractable, without status epilepticus: Secondary | ICD-10-CM

## 2024-02-22 NOTE — Telephone Encounter (Signed)
 Please see pt msg and advise

## 2024-02-24 ENCOUNTER — Ambulatory Visit (HOSPITAL_COMMUNITY)

## 2024-02-24 NOTE — Patient Instructions (Signed)
 Repeat ultrasound as discussed to be scheduled in May.  Follow-up in office with me in 6 months for regular recheck.

## 2024-03-01 ENCOUNTER — Ambulatory Visit (HOSPITAL_COMMUNITY)
Admission: RE | Admit: 2024-03-01 | Discharge: 2024-03-01 | Disposition: A | Discharge status: Home / Self Care | Source: Ambulatory Visit | Attending: Gastroenterology | Admitting: Gastroenterology

## 2024-03-01 DIAGNOSIS — Z860100 Personal history of colon polyps, unspecified: Secondary | ICD-10-CM | POA: Diagnosis present

## 2024-03-01 DIAGNOSIS — R143 Flatulence: Secondary | ICD-10-CM | POA: Insufficient documentation

## 2024-03-01 DIAGNOSIS — R194 Change in bowel habit: Secondary | ICD-10-CM | POA: Insufficient documentation

## 2024-03-01 DIAGNOSIS — R1032 Left lower quadrant pain: Secondary | ICD-10-CM | POA: Insufficient documentation

## 2024-03-01 DIAGNOSIS — R140 Abdominal distension (gaseous): Secondary | ICD-10-CM | POA: Insufficient documentation

## 2024-03-01 MED ORDER — IOHEXOL 300 MG/ML  SOLN
100.0000 mL | Freq: Once | INTRAMUSCULAR | Status: AC | PRN
  Administered 2024-03-01: 100 mL via INTRAVENOUS

## 2024-03-07 ENCOUNTER — Ambulatory Visit (HOSPITAL_BASED_OUTPATIENT_CLINIC_OR_DEPARTMENT_OTHER)
Admission: RE | Admit: 2024-03-07 | Discharge: 2024-03-07 | Disposition: A | Discharge status: Home / Self Care | Source: Ambulatory Visit | Attending: Physician Assistant | Admitting: Physician Assistant

## 2024-03-07 DIAGNOSIS — R591 Generalized enlarged lymph nodes: Secondary | ICD-10-CM | POA: Diagnosis present

## 2024-03-08 ENCOUNTER — Ambulatory Visit: Payer: Self-pay | Admitting: Physician Assistant

## 2024-03-08 ENCOUNTER — Other Ambulatory Visit: Payer: Self-pay | Admitting: Physician Assistant

## 2024-03-08 ENCOUNTER — Encounter: Payer: Self-pay | Admitting: Physician Assistant

## 2024-03-08 DIAGNOSIS — R591 Generalized enlarged lymph nodes: Secondary | ICD-10-CM

## 2024-03-08 DIAGNOSIS — R9389 Abnormal findings on diagnostic imaging of other specified body structures: Secondary | ICD-10-CM

## 2024-03-09 ENCOUNTER — Encounter: Payer: Self-pay | Admitting: Physician Assistant

## 2024-03-09 NOTE — Telephone Encounter (Signed)
 Please see pt request and advise on Rx or needing an appointment

## 2024-03-10 NOTE — Telephone Encounter (Signed)
 Please see pt msg and advise

## 2024-03-10 NOTE — Telephone Encounter (Signed)
 LVM to schedule VV per PCP recommendations

## 2024-03-10 NOTE — Telephone Encounter (Signed)
 Please contact patient and schedule VV per PCP recommendations

## 2024-03-13 ENCOUNTER — Other Ambulatory Visit (HOSPITAL_BASED_OUTPATIENT_CLINIC_OR_DEPARTMENT_OTHER)

## 2024-03-17 ENCOUNTER — Other Ambulatory Visit (HOSPITAL_BASED_OUTPATIENT_CLINIC_OR_DEPARTMENT_OTHER): Payer: Self-pay

## 2024-03-17 MED ORDER — SHINGRIX 50 MCG/0.5ML IM SUSR
0.5000 mL | Freq: Once | INTRAMUSCULAR | 0 refills | Status: AC
  Filled 2024-03-17: qty 0.5, 1d supply, fill #0

## 2024-03-18 ENCOUNTER — Other Ambulatory Visit (HOSPITAL_BASED_OUTPATIENT_CLINIC_OR_DEPARTMENT_OTHER): Payer: Self-pay

## 2024-03-18 MED FILL — Rosuvastatin Calcium Tab 20 MG: ORAL | 90 days supply | Qty: 90 | Fill #0 | Status: AC

## 2024-03-22 ENCOUNTER — Ambulatory Visit: Admitting: Physician Assistant

## 2024-03-27 ENCOUNTER — Other Ambulatory Visit (HOSPITAL_BASED_OUTPATIENT_CLINIC_OR_DEPARTMENT_OTHER): Payer: Self-pay | Admitting: *Deleted

## 2024-03-27 ENCOUNTER — Encounter (HOSPITAL_BASED_OUTPATIENT_CLINIC_OR_DEPARTMENT_OTHER): Payer: Self-pay | Admitting: *Deleted

## 2024-03-27 DIAGNOSIS — R931 Abnormal findings on diagnostic imaging of heart and coronary circulation: Secondary | ICD-10-CM

## 2024-03-27 DIAGNOSIS — I2510 Atherosclerotic heart disease of native coronary artery without angina pectoris: Secondary | ICD-10-CM

## 2024-03-27 DIAGNOSIS — E785 Hyperlipidemia, unspecified: Secondary | ICD-10-CM

## 2024-04-20 ENCOUNTER — Encounter: Payer: Self-pay | Admitting: Physician Assistant

## 2024-05-12 ENCOUNTER — Other Ambulatory Visit (HOSPITAL_BASED_OUTPATIENT_CLINIC_OR_DEPARTMENT_OTHER): Payer: Self-pay

## 2024-05-12 MED FILL — Rosuvastatin Calcium Tab 20 MG: ORAL | 90 days supply | Qty: 90 | Fill #1 | Status: CN

## 2024-05-22 ENCOUNTER — Other Ambulatory Visit (HOSPITAL_BASED_OUTPATIENT_CLINIC_OR_DEPARTMENT_OTHER): Payer: Self-pay

## 2024-05-24 ENCOUNTER — Other Ambulatory Visit (HOSPITAL_COMMUNITY): Payer: Self-pay | Admitting: Physician Assistant

## 2024-05-24 ENCOUNTER — Encounter: Payer: Self-pay | Admitting: Physician Assistant

## 2024-05-24 DIAGNOSIS — R591 Generalized enlarged lymph nodes: Secondary | ICD-10-CM

## 2024-05-25 ENCOUNTER — Encounter (HOSPITAL_COMMUNITY): Payer: Self-pay

## 2024-05-25 ENCOUNTER — Other Ambulatory Visit: Payer: Self-pay | Admitting: Neurology

## 2024-05-25 ENCOUNTER — Other Ambulatory Visit (HOSPITAL_BASED_OUTPATIENT_CLINIC_OR_DEPARTMENT_OTHER): Payer: Self-pay

## 2024-05-25 MED ORDER — LAMOTRIGINE 200 MG PO TABS
200.0000 mg | ORAL_TABLET | Freq: Two times a day (BID) | ORAL | 3 refills | Status: AC
  Filled 2024-05-25 – 2024-06-09 (×2): qty 180, 90d supply, fill #0
  Filled 2024-09-03: qty 180, 90d supply, fill #1

## 2024-05-25 MED FILL — Rosuvastatin Calcium Tab 20 MG: ORAL | 90 days supply | Qty: 90 | Fill #1 | Status: AC

## 2024-05-25 NOTE — Progress Notes (Signed)
 Karalee Wilkie POUR, MD  Yaire Kreher Approved for US  guided biopsy of left cervical adenopathy.    US  03/07/24: Redemonstration of multiple enlarged lymph nodes. Given lack of change in the interim and irregularity of the cortex, recommend further imaging workup or percutaneous biopsy for further evaluation.  Please schedule for Dr. Jenna to perform.  HKM       Previous Messages    ----- Message ----- From: Captain Blucher Sent: 05/24/2024  12:15 PM EDT To: Azir Muzyka; Ir Procedure Requests Subject: US  core biopsy ( lymph nodes)                  Procedure : US  core ( lymph nodes)  Reason: generalized enlarged lymph nodes on the left and abnormal US  Dx: Generalized enlarged lymph nodes [R59.1 (ICD-10-CM)]      History : US  soft tissue head and neck , CT abd & pelv w/ , US  soft tissue neck  Provider : Allwardt, Mardy HERO, PA-C  Contact : [phone]

## 2024-06-05 ENCOUNTER — Other Ambulatory Visit (HOSPITAL_BASED_OUTPATIENT_CLINIC_OR_DEPARTMENT_OTHER): Payer: Self-pay

## 2024-06-07 ENCOUNTER — Encounter (HOSPITAL_BASED_OUTPATIENT_CLINIC_OR_DEPARTMENT_OTHER): Admitting: Nurse Practitioner

## 2024-06-09 ENCOUNTER — Other Ambulatory Visit (HOSPITAL_BASED_OUTPATIENT_CLINIC_OR_DEPARTMENT_OTHER): Payer: Self-pay

## 2024-06-14 LAB — LIPID PANEL
Chol/HDL Ratio: 1.8 ratio (ref 0.0–4.4)
Cholesterol, Total: 128 mg/dL (ref 100–199)
HDL: 72 mg/dL (ref >39)
LDL Chol Calc (NIH): 45 mg/dL (ref 0–99)
Triglycerides: 44 mg/dL (ref 0–149)
VLDL Cholesterol Cal: 11 mg/dL (ref 5–40)

## 2024-06-14 NOTE — Progress Notes (Signed)
 Cardiology Office Note:  .   Date:  06/21/2024  ID:  Summer [name], DOB Mar 10, 1951, MRN [id number] PCP: Allwardt, Mardy HERO, PA-C  [facility] Providers Cardiologist:  Alm Clay, MD    Patient Profile: .      PMH Hyperlipidemia Nuclear stress test 10/2017 Low risk Coronary artery disease CT Calcium  score 03/10/2022 CAC score 896 (96th percentile) LM 0, LAD 426, LCx 299, RCA 171 IDDA Prior Etoh abuse Sober since 2014 Seizures Headaches  Referred to advanced lipid disorder clinic and seen by Dr. Mona 07/10/2022.  She has a history of dyslipidemia and underwent calcium  scoring and May 2023 for further risk stratification.  Calcium  score was elevated at 896 (96 percentile).  She had not been on any lipid-lowering therapy prior to this.  Her lipid profile at that time showed total cholesterol 188, triglycerides 68, HDL 60 and LDL 115.  No family history of early onset heart disease.  She typically tries to eat a diet low in saturated fat.  She exercises fairly regularly and was asymptomatic.  She was advised to start rosuvastatin  20 mg daily which was compatible with her antiseizure medications.  She was also advised to start aspirin  81 mg daily.  At return office visit 10/13/2022 she had significant improvement in lipids with total cholesterol now 166, triglycerides 79, HDL 74, and LDL 77.  Her LDL particle number was low at 952 with a small LDL particle number at 375.  Fortunately her LP(a) was negative at 32 nmol/L.  Seen in clinic by me on 12/01/23 for follow-up of dyslipidemia.  Lipid panel from 11/29/2023 reveals total cholesterol 140, triglycerides 62, HDL 70, and LDL-C 57. She reports she is feeling well with the exception of a thumping sensation in the right side of her neck.  It is not constant. She also notes occasional right arm pain. She reports a 15 lb weight gain since October 2024 and is planning to start GLP-1 agonist but is awaiting PA from prescriber. Admits she  has not been as active with regular exercise, used to come to [facility]. Her diet is overall healthy. She has persistent LLE swelling from prior knee replacement.  She denied chest pain, shortness of breath, orthopnea, PND, edema, presyncope, or syncope. Carotid ultrasound was ordered due to pulsing sensation in neck and carotid bruit. Carotid stenosis was only minimal, however there was concern for lymph node enlargement. Neck u/s revealed 2 non-enlarged lymph nodes with follow-up ultrasound May 2025 revealing multiple enlarged lymph nodes with recommendation by radiologist for percutaneous biopsy.        History of Present Illness: Summer [name] is a very pleasant 73 y.o. female who is here today for follow-up of dyslipidemia.  She reports she is generally feeling well and is planning to go workout at [facility] well after her visit.  She had follow-up for enlarged lymph nodes on the left side and ultimately decided with the physician not to pursue biopsy.  She continues to have right sided neck pain that she describes as a thumping sensation and has not been able to identify a probable cause.  She denies lightheadedness, presyncope, syncope.  She remains active and denies chest pain, shortness of breath, palpitations, activity intolerance, orthopnea, PND, edema. No significant concerns today.   ROS: See HPI       Studies Reviewed: .         Risk Assessment/Calculations:  Physical Exam:   VS:  BP 102/80 (BP Location: Left Arm, Patient Position: Sitting, Cuff Size: Large)   Pulse 81   Ht 5' 5 (1.651 m)   Wt 213 lb (96.6 kg)   SpO2 96%   BMI 35.45 kg/m    Wt Readings from Last 3 Encounters:  06/21/24 213 lb (96.6 kg)  02/17/24 209 lb (94.8 kg)  02/14/24 210 lb (95.3 kg)    GEN: Obese, well developed in no acute distress NECK: No JVD; + left carotid bruits CARDIAC: RRR, no murmurs, rubs, gallops RESPIRATORY:  Clear to auscultation without rales, wheezing or rhonchi   ABDOMEN: Soft, non-tender, non-distended EXTREMITIES:  No edema; No deformity     ASSESSMENT AND PLAN: .    Dyslipidemia LDL goal < 70: Lipid profile completed 06/14/2024 reveals total cholesterol 128, triglycerides 44, HDL 72, LDL-C 45.  Lipids are well-controlled on current therapy of rosuvastatin  20 mg and Lovaza 1 g daily with no concerning side effects. Focus on secondary prevention of ASCVD including heart healthy mostly plant based diet avoiding saturated fat, processed foods, simple carbohydrates, and sugar along with aiming for at least 150 minutes of moderate intensity exercise each week.   Neck discomfort/left carotid stenosis: Carotid duplex 12/23/23 revealed 1-39% stenosis LICA, near normal RICA, no stenosis, normal flow dynamics in vertebral and subclavian arteries. She continues to have neck discomfort in the right side of her neck with no obvious causes. She is limiting weight lifting to see if pain improves. No presyncope or syncope. Plan to repeat carotid duplex in 2-3 years, sooner if clinically indicated.   CAD without angina/Coronary artery calcification/Cardiac risk: CT calcium  score 02/2022 with CAC of 896 (96th percentile). We discussed the location of coronary calcification and management including lifestyle modification, maintaining good lipid, glucose, and cholesterol control. Low risk Myoview  in 2019. She denies chest pain, dyspnea, or other symptoms concerning for angina. EKG reveals No indication for further ischemic evaluation at this time. Continue regular physical activity and aiming for at least 150 minutes of moderate intensity exercise each week. No bleeding concerns. Continue aspirin , rosuvastatin , and Lovaza.        Disposition: 1 year with me (per pt request)  Signed, Rosaline Bane, NP-C

## 2024-06-15 ENCOUNTER — Ambulatory Visit: Payer: Self-pay | Admitting: Nurse Practitioner

## 2024-06-15 LAB — COMPREHENSIVE METABOLIC PANEL WITH GFR
ALT: 21 IU/L (ref 0–32)
AST: 28 IU/L (ref 0–40)
Albumin: 4.2 g/dL (ref 3.8–4.8)
Alkaline Phosphatase: 69 IU/L (ref 44–121)
BUN/Creatinine Ratio: 19 (ref 12–28)
BUN: 11 mg/dL (ref 8–27)
Bilirubin Total: 0.4 mg/dL (ref 0.0–1.2)
CO2: 24 mmol/L (ref 20–29)
Calcium: 9.1 mg/dL (ref 8.7–10.3)
Chloride: 102 mmol/L (ref 96–106)
Creatinine, Ser: 0.59 mg/dL (ref 0.57–1.00)
Globulin, Total: 1.9 g/dL (ref 1.5–4.5)
Glucose: 91 mg/dL (ref 70–99)
Potassium: 4.4 mmol/L (ref 3.5–5.2)
Sodium: 140 mmol/L (ref 134–144)
Total Protein: 6.1 g/dL (ref 6.0–8.5)
eGFR: 96 mL/min/1.73 (ref >59)

## 2024-06-19 ENCOUNTER — Other Ambulatory Visit: Payer: Self-pay | Admitting: Radiology

## 2024-06-20 ENCOUNTER — Other Ambulatory Visit (HOSPITAL_COMMUNITY): Payer: Self-pay | Admitting: Physician Assistant

## 2024-06-20 ENCOUNTER — Ambulatory Visit (HOSPITAL_COMMUNITY)
Admission: RE | Admit: 2024-06-20 | Discharge: 2024-06-20 | Disposition: A | Discharge status: Home / Self Care | Source: Ambulatory Visit | Attending: Physician Assistant | Admitting: Physician Assistant

## 2024-06-20 DIAGNOSIS — R591 Generalized enlarged lymph nodes: Secondary | ICD-10-CM | POA: Insufficient documentation

## 2024-06-21 ENCOUNTER — Encounter (HOSPITAL_BASED_OUTPATIENT_CLINIC_OR_DEPARTMENT_OTHER): Payer: Self-pay | Admitting: Nurse Practitioner

## 2024-06-21 ENCOUNTER — Ambulatory Visit (HOSPITAL_BASED_OUTPATIENT_CLINIC_OR_DEPARTMENT_OTHER): Admitting: Nurse Practitioner

## 2024-06-21 ENCOUNTER — Ambulatory Visit: Payer: Self-pay | Admitting: Physician Assistant

## 2024-06-21 VITALS — BP 102/80 | HR 81 | Ht 65.0 in | Wt 213.0 lb

## 2024-06-21 DIAGNOSIS — Z7189 Other specified counseling: Secondary | ICD-10-CM

## 2024-06-21 DIAGNOSIS — M542 Cervicalgia: Secondary | ICD-10-CM

## 2024-06-21 DIAGNOSIS — I2510 Atherosclerotic heart disease of native coronary artery without angina pectoris: Secondary | ICD-10-CM

## 2024-06-21 DIAGNOSIS — I6522 Occlusion and stenosis of left carotid artery: Secondary | ICD-10-CM

## 2024-06-21 DIAGNOSIS — R931 Abnormal findings on diagnostic imaging of heart and coronary circulation: Secondary | ICD-10-CM

## 2024-06-21 DIAGNOSIS — E785 Hyperlipidemia, unspecified: Secondary | ICD-10-CM

## 2024-06-21 NOTE — Patient Instructions (Signed)
 Medication Instructions:   Your physician recommends that you continue on your current medications as directed. Please refer to the Current Medication list given to you today.   *If you need a refill on your cardiac medications before your next appointment, please call your pharmacy*  Lab Work:  None ordered.  If you have labs (blood work) drawn today and your tests are completely normal, you will receive your results only by: MyChart Message (if you have MyChart) OR A paper copy in the mail If you have any lab test that is abnormal or we need to change your treatment, we will call you to review the results.  Testing/Procedures:  None ordered.  Follow-Up: At [facility], you and your health needs are our priority.  As part of our continuing mission to provide you with exceptional heart care, our providers are all part of one team.  This team includes your primary Cardiologist (physician) and Advanced Practice Providers or APPs (Physician Assistants and Nurse Practitioners) who all work together to provide you with the care you need, when you need it.  Your next appointment:   1 year(s)  Provider:   Alm Clay, MD    We recommend signing up for the patient portal called MyChart.  Sign up information is provided on this After Visit Summary.  MyChart is used to connect with patients for Virtual Visits (Telemedicine).  Patients are able to view lab/test results, encounter notes, upcoming appointments, etc.  Non-urgent messages can be sent to your provider as well.   To learn more about what you can do with MyChart, go to [email or web address].   Other Instructions  Your physician wants you to follow-up in: 1 year.  You will receive a reminder letter in the mail two months in advance. If you don't receive a letter, please call our office to schedule the follow-up appointment.

## 2024-07-05 ENCOUNTER — Telehealth: Payer: Self-pay

## 2024-07-05 NOTE — Telephone Encounter (Signed)
 Copied from CRM [id number]. Topic: Clinical - Request for Lab/Test Order >> Jul 05, 2024  1:15 PM Summer [name] wrote: Reason for CRM: Patient is calling to request an order for the covid vaccine to be sent to [facility]  Phone: [phone] Fax: [phone]  Mychart message sent to patient per current prodigal; see chart

## 2024-07-10 ENCOUNTER — Encounter: Payer: Self-pay | Admitting: Internal Medicine

## 2024-07-10 ENCOUNTER — Ambulatory Visit: Payer: Medicare Other | Admitting: Internal Medicine

## 2024-07-10 VITALS — BP 120/80 | HR 84 | Ht 65.0 in | Wt 216.0 lb

## 2024-07-10 DIAGNOSIS — Z8739 Personal history of other diseases of the musculoskeletal system and connective tissue: Secondary | ICD-10-CM

## 2024-07-10 DIAGNOSIS — M859 Disorder of bone density and structure, unspecified: Secondary | ICD-10-CM

## 2024-07-10 NOTE — Patient Instructions (Signed)
Calcium Citrate 1200 mg daily  Continue Vitamin D 2000 iu Twice daily

## 2024-07-10 NOTE — Progress Notes (Signed)
 Name: Summer [name]  MRN/ DOB: [id number], 11-15-1950    Age/ Sex: 73 y.o., female    PCP: Allwardt, Mardy HERO, PA-C   Reason for Endocrinology Evaluation: Osteoporosis      Date of Initial Endocrinology Evaluation: 09/10/2022    HPI: Summer [name] is a 73 y.o. female with a past medical history of Dyslipidemia, seizure d/o and osteopenia , S/P gastric bypass 2008, recovering alcoholic . The patient presented for initial endocrinology clinic visit on 09/10/2022 for consultative assistance with her Osteoporosis  Pt was diagnosed with Osteoporosis : unknown   Menarche at age : 54 Menopausal at age : 25's. Fracture Hx: none as an adult  Hx of HRT: no FH of osteoporosis or hip fracture: mother and sister  Prior Hx of anti-resorptive therapy : Has been on Prolia  : not sure but it has appeared on her med list in 2018.  She did not receive her first injection of Prolia  through our system until February 2019  On her initial visit with me I recommended completing Prolia  course as she has been on it for approximately 5 years, I did recommend Reclast  infusion, but the patient message back stating she wanted to postpone.  She was advised regarding the rebound phenomena of not taking a bridging medication after discontinuation of Prolia .  She was also advised to switch calcium  carbonate to citrate due to history of gastric bypass  Patient received her first zoledronic  acid injection on 11/03/2022    SUBJECTIVE:    Today (07/10/24):  Summer [name] is here for a follow up on osteoporosis.   Denies recent falls  She does have chronic polydipsia and frequency  No constipation or diarrhea  Denies nausea  No heartburn  She does weight bearing exercise, she goes to [employer]    Vitamin D3 4000 internation units  Calcium  citrate 1200 mg daily     HISTORY:  Past Medical History:  Past Medical History:  Diagnosis Date   Adenomatous polyp of sigmoid colon 03/18/2017    Overview:  Added automatically from request for surgery [id number]   Agatston coronary artery calcium  score greater than 400 01/28/2022   Coronary Calcium  Score 896 (LAD 426, LCx 299, RCA 171).  96th percentile.   Alcoholism (HCC)    01/18/18 - no use in 5 years   Asymptomatic varicose veins 01/11/2017   Cancer (HCC)    skin ca   Iron  deficiency anemia 01/12/2017   Last Assessment & Plan:  Given script for ferrous sulfate  and order for repeat cbc with diff and iron  studies to be done in 1 month Will also refer pt back to [facility] for repeat colonoscopy and to dietician for nutritional counseling, per pt request   Lentigines 01/11/2017   Melanocytic nevi of left lower limb, including hip 01/11/2017   Melanocytic nevi of left upper limb, including shoulder 01/11/2017   Melanocytic nevi of right lower limb, including hip 01/11/2017   Melanocytic nevi of right upper limb, including shoulder 01/11/2017   Melanocytic nevi of trunk 01/11/2017   Obesity (BMI 30-39.9) 05/21/2017   Osteoporosis    Plantar fasciitis    Primary osteoarthritis, right shoulder 12/11/2016   Seizure (HCC)    LAST SEIZURE- 2016 per pt   Sleep apnea    no cpap per pt   Statins contraindicated 10/2022   Tolerating rosuvastatin .   Unilateral primary osteoarthritis, left knee 12/11/2016   Past Surgical History:  Past Surgical History:  Procedure Laterality Date  BARIATRIC SURGERY     COLONOSCOPY  03/18/2017   in [place].   ECTOPIC PREGNANCY SURGERY     NM [facility]  LTD  10/2017   Normal EF.  LOW RISK.  No ischemia or infarction.  No RWMA.   POLYPECTOMY     SIGMOIDOSCOPY     in [place].   TOTAL KNEE ARTHROPLASTY Left 09/07/2017   Procedure: LEFT TOTAL KNEE ARTHROPLASTY;  Surgeon: Ernie Cough, MD;  Location: [facility];  Service: Orthopedics;  Laterality: Left;  Adductor Block    Social History:  reports that she has never smoked. She has never used smokeless tobacco. She reports that she does not drink alcohol and does not use  drugs. Family History: family history includes Alcoholism in her father; Colon cancer (age of onset: 52) in her father; Memory loss in her mother; Mitral valve prolapse in her mother; Osteoporosis in her mother.   HOME MEDICATIONS: Allergies as of 07/10/2024       Reactions   Bee Venom Swelling        Medication List        Accurate as of July 10, 2024 11:40 AM. If you have any questions, ask your nurse or doctor.          aspirin  EC 81 MG tablet Take 81 mg by mouth daily. Swallow whole.   Calcium -Vitamin D -Vitamin K [id number] MG-UNT-MCG Tabs Take 1 tablet by mouth 2 (two) times daily. Also includes Vitamin C What changed: when to take this   Cholecalciferol 100 MCG (4000 UT) Caps Take 4,000 Units by mouth daily.   denosumab  60 MG/ML Soln injection Commonly known as: PROLIA  Inject 60 mg into the skin every 6 (six) months. Administer in upper arm, thigh, or abdomen   EPINEPHrine  0.3 mg/0.3 mL Soaj injection Commonly known as: EPI-PEN Inject 0.3 mLs (0.3 mg total) into the muscle as needed for anaphylaxis.   lamoTRIgine  200 MG tablet Commonly known as: LaMICtal  Take 1 tablet (200 mg total) by mouth 2 (two) times daily.   omega-3 acid ethyl esters 1 g capsule Commonly known as: LOVAZA Take 1 g by mouth daily.   rosuvastatin  20 MG tablet Commonly known as: CRESTOR  Take 1 tablet (20 mg total) by mouth daily.   UNABLE TO FIND Take 1 tablet by mouth 2 (two) times daily. Calcium  magnesium  intensive care What changed: when to take this   VITAMIN B 12 PO Take 1 tablet by mouth daily.   vitamin C 100 MG tablet Take 100 mg by mouth daily.   vitamin E 600 UNIT capsule Take 600 Units by mouth daily.          REVIEW OF SYSTEMS: A comprehensive ROS was conducted with the patient and is negative except as per HPI    OBJECTIVE:  VS: BP 120/80 (BP Location: Left Arm, Patient Position: Sitting, Cuff Size: Normal)   Pulse 84   Ht 5' 5 (1.651 m)   Wt  216 lb (98 kg)   SpO2 99%   BMI 35.94 kg/m    Wt Readings from Last 3 Encounters:  07/10/24 216 lb (98 kg)  06/21/24 213 lb (96.6 kg)  02/17/24 209 lb (94.8 kg)     EXAM: General: Pt appears well and is in NAD  Neck: General: Supple without adenopathy. Thyroid : Thyroid  size normal.  No goiter or nodules appreciated.   Lungs: Clear with good BS bilat   Heart: Auscultation: RRR.  Abdomen: Normoactive bowel sounds, soft, nontender  Extremities:  BL LE: No  pretibial edema normal ROM and strength.  Mental Status: Judgment, insight: Intact Orientation: Oriented to time, place, and person Mood and affect: No depression, anxiety, or agitation     DATA REVIEWED:       Latest Reference Range & Units 07/16/23 12:25  VITD 30.00 - 100.00 ng/mL 44.10    Latest Reference Range & Units 07/16/23 12:25  PTH, Intact 16 - 77 pg/mL 40     Latest Reference Range & Units 06/14/24 08:31  Sodium 134 - 144 mmol/L 140  Potassium 3.5 - 5.2 mmol/L 4.4  Chloride 96 - 106 mmol/L 102  CO2 20 - 29 mmol/L 24  Glucose 70 - 99 mg/dL 91  BUN 8 - 27 mg/dL 11  Creatinine 9.42 - 8.99 mg/dL 9.40  Calcium  8.7 - 10.3 mg/dL 9.1  BUN/Creatinine Ratio 12 - 28  19  eGFR >59 mL/min/1.73 96  Alkaline Phosphatase 44 - 121 IU/L 69  Albumin 3.8 - 4.8 g/dL 4.2  AST 0 - 40 IU/L 28  ALT 0 - 32 IU/L 21  Total Protein 6.0 - 8.5 g/dL 6.1  Total Bilirubin 0.0 - 1.2 mg/dL 0.4     DXA 7/81/7974  ASSESSMENT: The BMD measured at DualFemur Neck Left is 0.720 g/cm2 with a T-score of -2.3. This patient's diagnostic category is LOW BONE MASS/OSTEOPENIA according to World Health Organization [facility]) criteria. Scan quality is good. Exclusions: L-3 degenerative.     Site Region Measured Date Measured Age YA BMD Significant CHANGE T-score DualFemur Neck Left  12/14/2023    [age]         -2.3    0.720 g/cm2   AP Spine  L1-L4 (L3) 12/14/2023    [age]         -1.8    0.959 g/cm2   DualFemur Total Mean 12/14/2023     72.2         -1.4    0.827 g/cm2     Old records , labs and images have been reviewed.    ASSESSMENT/PLAN/RECOMMENDATIONS:   Hx of Osteoporosis :  -Patient was on Prolia  from 2018 until 2023 -She received 1 dose of zoledronic  acid 10/2022 without any side effects -Patient is s/p gastric bypass, we discussed the importance of optimizing calcium  and vitamin D  intake  -We also discussed the importance of weightbearing exercises, which she continues to do - C telopeptide 223 06/2023  Medications : Calcium  citrate 1200 mg daily Continue vitamin D  2000 IU twice daily   Follow-up in 1 year   Signed electronically by: Stefano Redgie Butts, MD  [facility]  [facility] [address] [place], [place] [address] Phone: [phone] FAX: [phone]   CC: Allwardt, Mardy HERO, PA-C [address] [place] [address] Phone: [phone] Fax: [phone]   Return to Endocrinology clinic as below: Future Appointments  Date Time Provider Department Center  12/21/2024  9:45 AM [place] Lauraine PARAS, NP [facility] None  01/30/2025  9:20 AM [facility] ANNUAL WELLNESS VISIT 1 [facility] Milian  02/01/2025  8:40 AM [facility] ANNUAL WELLNESS VISIT 1 [facility] [place]

## 2024-07-17 ENCOUNTER — Other Ambulatory Visit (HOSPITAL_BASED_OUTPATIENT_CLINIC_OR_DEPARTMENT_OTHER): Payer: Self-pay

## 2024-07-17 MED ORDER — COMIRNATY 30 MCG/0.3ML IM SUSY
0.3000 mL | PREFILLED_SYRINGE | Freq: Once | INTRAMUSCULAR | 0 refills | Status: AC
  Filled 2024-07-17: qty 0.3, 1d supply, fill #0

## 2024-07-21 ENCOUNTER — Other Ambulatory Visit (HOSPITAL_BASED_OUTPATIENT_CLINIC_OR_DEPARTMENT_OTHER): Payer: Self-pay

## 2024-07-21 MED ORDER — FLUZONE HIGH-DOSE 0.5 ML IM SUSY
0.5000 mL | PREFILLED_SYRINGE | Freq: Once | INTRAMUSCULAR | 0 refills | Status: AC
Start: 2024-07-21 — End: 2024-07-22
  Filled 2024-07-21: qty 0.5, 1d supply, fill #0

## 2024-09-03 ENCOUNTER — Encounter (INDEPENDENT_AMBULATORY_CARE_PROVIDER_SITE_OTHER): Payer: Self-pay

## 2024-09-10 ENCOUNTER — Other Ambulatory Visit: Payer: Self-pay | Admitting: Internal Medicine

## 2024-09-13 ENCOUNTER — Other Ambulatory Visit (HOSPITAL_BASED_OUTPATIENT_CLINIC_OR_DEPARTMENT_OTHER): Payer: Self-pay

## 2024-09-13 MED ORDER — ROSUVASTATIN CALCIUM 20 MG PO TABS
20.0000 mg | ORAL_TABLET | Freq: Every day | ORAL | 3 refills | Status: AC
  Filled 2024-09-13: qty 90, 90d supply, fill #0

## 2024-10-09 ENCOUNTER — Other Ambulatory Visit (HOSPITAL_BASED_OUTPATIENT_CLINIC_OR_DEPARTMENT_OTHER): Payer: Self-pay

## 2024-10-09 MED ORDER — MELOXICAM 15 MG PO TABS
15.0000 mg | ORAL_TABLET | Freq: Every day | ORAL | 0 refills | Status: DC
  Filled 2024-10-09: qty 30, 30d supply, fill #0

## 2024-10-25 ENCOUNTER — Other Ambulatory Visit (HOSPITAL_BASED_OUTPATIENT_CLINIC_OR_DEPARTMENT_OTHER): Payer: Self-pay

## 2024-10-25 MED ORDER — PREDNISONE 10 MG PO TABS
ORAL_TABLET | ORAL | 0 refills | Status: AC
  Filled 2024-10-25: qty 42, 12d supply, fill #0

## 2024-10-30 ENCOUNTER — Other Ambulatory Visit (HOSPITAL_BASED_OUTPATIENT_CLINIC_OR_DEPARTMENT_OTHER): Payer: Self-pay

## 2024-11-01 ENCOUNTER — Other Ambulatory Visit (HOSPITAL_BASED_OUTPATIENT_CLINIC_OR_DEPARTMENT_OTHER): Payer: Self-pay

## 2024-11-01 MED ORDER — PREVNAR 20 0.5 ML IM SUSY
0.5000 mL | PREFILLED_SYRINGE | Freq: Once | INTRAMUSCULAR | 0 refills | Status: AC
  Filled 2024-11-01: qty 0.5, 1d supply, fill #0

## 2024-11-06 ENCOUNTER — Other Ambulatory Visit (HOSPITAL_BASED_OUTPATIENT_CLINIC_OR_DEPARTMENT_OTHER): Payer: Self-pay

## 2024-11-06 MED ORDER — MELOXICAM 15 MG PO TABS
15.0000 mg | ORAL_TABLET | Freq: Every day | ORAL | 0 refills | Status: AC
  Filled 2024-11-06: qty 30, 30d supply, fill #0

## 2024-11-14 ENCOUNTER — Other Ambulatory Visit: Payer: Self-pay | Admitting: Physician Assistant

## 2024-11-14 DIAGNOSIS — Z1231 Encounter for screening mammogram for malignant neoplasm of breast: Secondary | ICD-10-CM

## 2024-11-30 ENCOUNTER — Ambulatory Visit

## 2024-12-01 ENCOUNTER — Other Ambulatory Visit: Payer: Self-pay | Admitting: Medical Genetics

## 2024-12-12 ENCOUNTER — Ambulatory Visit

## 2024-12-18 ENCOUNTER — Ambulatory Visit: Admitting: Neurology

## 2024-12-21 ENCOUNTER — Ambulatory Visit: Payer: Medicare Other | Admitting: Neurology

## 2025-01-30 ENCOUNTER — Ambulatory Visit

## 2025-02-01 ENCOUNTER — Ambulatory Visit
